# Patient Record
Sex: Female | Born: 1985 | Race: White | Hispanic: No | Marital: Married | Smoking: Never smoker
Health system: Southern US, Community
[De-identification: ages and names within clinical notes are randomized; demographics above are authoritative.]

## PROBLEM LIST (undated history)

## (undated) DIAGNOSIS — O24419 Gestational diabetes mellitus in pregnancy, unspecified control: Secondary | ICD-10-CM

## (undated) DIAGNOSIS — R7303 Prediabetes: Secondary | ICD-10-CM

## (undated) DIAGNOSIS — N979 Female infertility, unspecified: Secondary | ICD-10-CM

## (undated) DIAGNOSIS — Z8619 Personal history of other infectious and parasitic diseases: Secondary | ICD-10-CM

## (undated) DIAGNOSIS — F419 Anxiety disorder, unspecified: Secondary | ICD-10-CM

## (undated) DIAGNOSIS — R030 Elevated blood-pressure reading, without diagnosis of hypertension: Secondary | ICD-10-CM

## (undated) DIAGNOSIS — N83201 Unspecified ovarian cyst, right side: Secondary | ICD-10-CM

## (undated) DIAGNOSIS — J309 Allergic rhinitis, unspecified: Secondary | ICD-10-CM

## (undated) DIAGNOSIS — D649 Anemia, unspecified: Secondary | ICD-10-CM

## (undated) HISTORY — DX: Anemia, unspecified: D64.9

## (undated) HISTORY — PX: CHOLECYSTECTOMY, LAPAROSCOPIC: SHX56

## (undated) HISTORY — DX: Unspecified ovarian cyst, right side: N83.201

## (undated) HISTORY — DX: Elevated blood-pressure reading, without diagnosis of hypertension: R03.0

## (undated) HISTORY — PX: WISDOM TOOTH EXTRACTION: SHX21

## (undated) HISTORY — DX: Prediabetes: R73.03

## (undated) HISTORY — DX: Female infertility, unspecified: N97.9

## (undated) HISTORY — DX: Anxiety disorder, unspecified: F41.9

## (undated) HISTORY — DX: Allergic rhinitis, unspecified: J30.9

## (undated) HISTORY — PX: TONSILLECTOMY AND ADENOIDECTOMY: SUR1326

## (undated) HISTORY — DX: Personal history of other infectious and parasitic diseases: Z86.19

---

## 2012-11-09 DIAGNOSIS — N83201 Unspecified ovarian cyst, right side: Secondary | ICD-10-CM

## 2012-11-09 HISTORY — DX: Unspecified ovarian cyst, right side: N83.201

## 2014-06-28 ENCOUNTER — Other Ambulatory Visit (INDEPENDENT_AMBULATORY_CARE_PROVIDER_SITE_OTHER): Payer: BC Managed Care – PPO

## 2014-06-28 ENCOUNTER — Encounter: Payer: Self-pay | Admitting: Internal Medicine

## 2014-06-28 ENCOUNTER — Ambulatory Visit (INDEPENDENT_AMBULATORY_CARE_PROVIDER_SITE_OTHER): Payer: BC Managed Care – PPO | Admitting: Internal Medicine

## 2014-06-28 VITALS — BP 98/78 | HR 75 | Temp 98.3°F | Ht 62.0 in | Wt 136.2 lb

## 2014-06-28 DIAGNOSIS — Z0000 Encounter for general adult medical examination without abnormal findings: Secondary | ICD-10-CM

## 2014-06-28 DIAGNOSIS — J310 Chronic rhinitis: Secondary | ICD-10-CM | POA: Insufficient documentation

## 2014-06-28 DIAGNOSIS — R5381 Other malaise: Secondary | ICD-10-CM

## 2014-06-28 DIAGNOSIS — F988 Other specified behavioral and emotional disorders with onset usually occurring in childhood and adolescence: Secondary | ICD-10-CM

## 2014-06-28 DIAGNOSIS — R5383 Other fatigue: Secondary | ICD-10-CM

## 2014-06-28 LAB — BASIC METABOLIC PANEL
BUN: 11 mg/dL (ref 6–23)
CALCIUM: 9.7 mg/dL (ref 8.4–10.5)
CO2: 32 mEq/L (ref 19–32)
Chloride: 100 mEq/L (ref 96–112)
Creatinine, Ser: 0.6 mg/dL (ref 0.4–1.2)
GFR: 118.67 mL/min (ref >60.00)
Glucose, Bld: 90 mg/dL (ref 70–99)
Potassium: 3.8 mEq/L (ref 3.5–5.1)
SODIUM: 137 meq/L (ref 135–145)

## 2014-06-28 LAB — CBC WITH DIFFERENTIAL/PLATELET
BASOS ABS: 0 10*3/uL (ref 0.0–0.1)
BASOS PCT: 0.6 % (ref 0.0–3.0)
EOS PCT: 2 % (ref 0.0–5.0)
Eosinophils Absolute: 0.2 10*3/uL (ref 0.0–0.7)
HEMATOCRIT: 40.5 % (ref 36.0–46.0)
HEMOGLOBIN: 13.3 g/dL (ref 12.0–15.0)
LYMPHS PCT: 32.1 % (ref 12.0–46.0)
Lymphs Abs: 2.5 10*3/uL (ref 0.7–4.0)
MCHC: 33 g/dL (ref 30.0–36.0)
MCV: 85.8 fl (ref 78.0–100.0)
MONOS PCT: 8.1 % (ref 3.0–12.0)
Monocytes Absolute: 0.6 10*3/uL (ref 0.1–1.0)
Neutro Abs: 4.5 10*3/uL (ref 1.4–7.7)
Neutrophils Relative %: 57.2 % (ref 43.0–77.0)
Platelets: 191 10*3/uL (ref 150.0–400.0)
RBC: 4.71 Mil/uL (ref 3.87–5.11)
RDW: 13.8 % (ref 11.5–15.5)
WBC: 7.8 10*3/uL (ref 4.0–10.5)

## 2014-06-28 LAB — HEPATIC FUNCTION PANEL
ALK PHOS: 37 U/L — AB (ref 39–117)
ALT: 19 U/L (ref 0–35)
AST: 21 U/L (ref 0–37)
Albumin: 4.4 g/dL (ref 3.5–5.2)
Bilirubin, Direct: 0.1 mg/dL (ref 0.0–0.3)
TOTAL PROTEIN: 7 g/dL (ref 6.0–8.3)
Total Bilirubin: 0.7 mg/dL (ref 0.2–1.2)

## 2014-06-28 LAB — TSH: TSH: 1.56 u[IU]/mL (ref 0.35–4.50)

## 2014-06-28 LAB — CK: Total CK: 130 U/L (ref 7–177)

## 2014-06-28 LAB — SEDIMENTATION RATE: Sed Rate: 3 mm/hr (ref 0–22)

## 2014-06-28 LAB — VITAMIN D 25 HYDROXY (VIT D DEFICIENCY, FRACTURES): VITD: 36.88 ng/mL (ref 30.00–100.00)

## 2014-06-28 LAB — T4, FREE: FREE T4: 0.92 ng/dL (ref 0.60–1.60)

## 2014-06-28 NOTE — Patient Instructions (Signed)
Your next office appointment will be determined based upon review of your pending labs . Those instructions will be transmitted to you through My Chart  OR  by mail;whichever process is your choice to receive results & recommendations . 

## 2014-06-28 NOTE — Progress Notes (Signed)
Subjective:    Patient ID: Julia Jimenez, female    DOB: [birth year], 28 y.o.   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 She describes fatigue which has been present since she had mono in 2007. She questions whether the stresses of her job and getting married have aggravated her symptoms. The symptoms are described as constant. Adderall has been of some benefit. She has gained 10 pounds in the last 3 years despite a heart healthy diet and cardiovascular exercise at a high level. She will exercise 3-6 times per week as running for 20-30 minutes or as intensive workout class for 60 minutes. She has no limitations with her  exercise programs.  Her ADD was only diagnosed in 2009. She was having difficulty studying for & completing standardized tests on time. The results were strongly positive.  She had been evaluated as a child for possible dyslexia as her brother has dyslexia.     Review of Systems She specifically denies fever, chills, sweats, or weight loss. She has no visual disturbances. She also has no itchy, watery eyes, sneezing, or angioedema. No hoarseness or difficulty swallowing. Dyspnea, cough or sore reduction not present. Even with high level CVE there is no chest pain, palpitations, or claudication. She has no bowel changes of constipation or diarrhea. She has no rectal bleeding or melena. She denies joint pain or swelling. She has no myalgias. There are no significant balance issues except for minor dizziness intermittently. She has no change in her hair, skin, nails. She does have some easy bruising. Excessive snoring or apnea are denied. She has intermittent numbness along the ulnar aspect of the forearm which appears to be positional , occuring with the arm pronated. She describes sensitivity to excessive noise exposure. She also has sinus pressure with postnasal drainage  which is perennial. Remotely she had some anxiety for which she took Fluoxetine 10 mg daily in 2006.     Objective:   Physical Exam Pertinent or positive physical findings include: She is thin allowing palpation of the aorta. There is no enlargement. She does have a post @ the umbilicus. Otherwise the exam is unremarkable.  Gen.: Healthy and well-nourished in appearance. Alert, appropriate and cooperative throughout exam. Appears younger than stated age  Head: Normocephalic without obvious abnormalities; no alopecia  Eyes: No corneal or conjunctival inflammation noted. Pupils equal round reactive to light and accommodation. Extraocular motion intact.No lid lag or proptosis. Ears: External  ear exam reveals no significant lesions or deformities. Canals clear .TMs normal. Hearing is grossly normal bilaterally. Nose: External nasal exam reveals no deformity or inflammation. Nasal mucosa are pink and moist. No lesions or exudates noted.   Mouth: Oral mucosa and oropharynx reveal no lesions or exudates. Teeth in good repair. Neck: No deformities, masses, or tenderness noted. Range of motion & Thyroid normal. Lungs: Normal respiratory effort; chest expands symmetrically. Lungs are clear to auscultation without rales, wheezes, or increased work of breathing. Heart: Normal rate and rhythm. Normal S1 and S2. No gallop, click, or rub. No murmur. Abdomen: Bowel sounds normal; abdomen soft and nontender. No masses, organomegaly or hernias noted. Genitalia: as per Gyn                                  Musculoskeletal/extremities: No deformity or scoliosis noted of  the thoracic or  lumbar spine.  No clubbing, cyanosis, edema, or significant extremity  deformity noted. Range of motion normal .Tone & strength normal. Hand joints normal OR reveal mild  DJD DIP changes.  Fingernail health good. Able to lie down & sit up w/o help. Negative SLR bilaterally Vascular: Carotid, radial artery, dorsalis pedis and   posterior tibial pulses are full and equal. No bruits present. Neurologic: Alert and oriented x3. Deep tendon reflexes symmetrical and normal.  Gait normal  .      Skin: Intact without suspicious lesions or rashes. Lymph: No cervical, axillary lymphadenopathy present. Psych: Mood and affect are normal. Normally interactive                                                                                        Assessment & Plan:  #1 comprehensive physical exam; no acute findings #2 fatigue  Plan: see Orders  & Recommendations

## 2014-06-28 NOTE — Progress Notes (Signed)
Pre visit review using our clinic review tool, if applicable. No additional management support is needed unless otherwise documented below in the visit note. 

## 2014-06-29 ENCOUNTER — Encounter: Payer: Self-pay | Admitting: Internal Medicine

## 2014-06-29 LAB — LYME AB/WESTERN BLOT REFLEX: B burgdorferi Ab IgG+IgM: 0.42 {ISR}

## 2015-03-15 ENCOUNTER — Ambulatory Visit (INDEPENDENT_AMBULATORY_CARE_PROVIDER_SITE_OTHER): Payer: BLUE CROSS/BLUE SHIELD | Admitting: Obstetrics and Gynecology

## 2015-03-15 ENCOUNTER — Encounter: Payer: Self-pay | Admitting: Obstetrics and Gynecology

## 2015-03-15 VITALS — BP 110/66 | HR 76 | Resp 18 | Ht 61.75 in | Wt 135.8 lb

## 2015-03-15 DIAGNOSIS — Z01419 Encounter for gynecological examination (general) (routine) without abnormal findings: Secondary | ICD-10-CM

## 2015-03-15 DIAGNOSIS — Z0000 Encounter for general adult medical examination without abnormal findings: Secondary | ICD-10-CM | POA: Diagnosis not present

## 2015-03-15 DIAGNOSIS — N762 Acute vulvitis: Secondary | ICD-10-CM | POA: Diagnosis not present

## 2015-03-15 DIAGNOSIS — N926 Irregular menstruation, unspecified: Secondary | ICD-10-CM

## 2015-03-15 DIAGNOSIS — N766 Ulceration of vulva: Secondary | ICD-10-CM | POA: Diagnosis not present

## 2015-03-15 LAB — POCT URINALYSIS DIPSTICK
Bilirubin, UA: NEGATIVE
Blood, UA: NEGATIVE
GLUCOSE UA: NEGATIVE
Ketones, UA: NEGATIVE
LEUKOCYTES UA: NEGATIVE
NITRITE UA: NEGATIVE
Protein, UA: NEGATIVE
Urobilinogen, UA: NEGATIVE
pH, UA: 5

## 2015-03-15 LAB — POCT URINE PREGNANCY: Preg Test, Ur: NEGATIVE

## 2015-03-15 NOTE — Progress Notes (Signed)
Patient ID: Woodfin GanjaLauren Taylor Irvin, female   DOB: April 30, 1986, 30 y.o.   MRN: [id number] 29 y.o. G0P0 Married Caucasian female here for annual exam.   Has Mirena IUD.  Spotting in April 2016.   Has periodic menses.  Considering childbearing in one year.   Gained weight since off Yaz.  Increasing work outs more.  No acne or hair growth.  Now living in same place with husband.   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   Not really taking Adderall.   PCP:  Marga MelnickWilliam Hopper, MD  No LMP recorded. Patient is not currently having periods (Reason: IUD).          Sexually active: Yes.   husband The current method of family planning is IUD.--Mirena inserted 06-21-13.   Had a few ultrasounds following placement and ultrasound was fine.  Cannot feel strings.  Exercising: Yes.    running, weight lifting and classes at the gym. Smoker:  no  Health Maintenance: Pap:  2012/2013 normal History of abnormal Pap:  no MMG:  n/a Colonoscopy:  n/a BMD:   n/a  Result  n/a TDaP:  2010 Screening Labs:  Hb today: PCP, Urine today: Neg Urine UPT:  Neg   reports that she has never smoked. She does not have any smokeless tobacco history on file. She reports that she drinks about 1.2 oz of alcohol per week. She reports that she does not use illicit drugs.  Past Medical History  Diagnosis Date  . Allergic rhinitis   . Elevated blood pressure reading without diagnosis of hypertension     in context of Adderall initiation  . History of chicken pox   . Anemia     in high school  . Anxiety   . Ovarian cyst, right 2014    Past Surgical History  Procedure Laterality Date  . Tonsillectomy and adenoidectomy      Current Outpatient Prescriptions  Medication Sig Dispense Refill  . levonorgestrel (MIRENA) 20 MCG/24HR IUD 1 each by Intrauterine route once.    Marland Kitchen. amphetamine-dextroamphetamine (ADDERALL XR) 20 MG 24 hr capsule Take 20 mg by mouth as needed.     No current facility-administered  medications for this visit.    Family History  Problem Relation Age of Onset  . Hypertension Mother   . Diabetes Father   . Diabetes Maternal Aunt   . Stroke Maternal Uncle   . Diabetes Maternal Uncle     x2  . Diabetes Paternal Grandmother   . Heart attack Paternal Grandmother   . Cancer Maternal Grandfather     Dec CA of unknown origin  . Hepatitis C Maternal Grandmother     Dec  . Hypertension Brother     ROS:  Pertinent items are noted in HPI.  Otherwise, a comprehensive ROS was negative.  Exam:   BP 110/66 mmHg  Pulse 76  Resp 18  Ht 5' 1.75" (1.568 m)  Wt 135 lb 12.8 oz (61.598 kg)  BMI 25.05 kg/m2  LMP     General appearance: alert, cooperative and appears stated age Head: Normocephalic, without obvious abnormality, atraumatic Neck: no adenopathy, supple, symmetrical, trachea midline and thyroid normal to inspection and palpation Lungs: clear to auscultation bilaterally Breasts: normal appearance, no masses or tenderness, Inspection negative, No nipple retraction or dimpling, No nipple discharge or bleeding, No axillary or supraclavicular adenopathy Heart: regular rate and rhythm Abdomen: soft, non-tender; bowel sounds normal; no masses,  no organomegaly Extremities: extremities normal, atraumatic, no cyanosis  or edema Skin: Skin color, texture, turgor normal. No rashes or lesions Lymph nodes: Cervical, supraclavicular, and axillary nodes normal. No abnormal inguinal nodes palpated Neurologic: Grossly normal  Pelvic: External genitalia:  Ulcerative fissure of the let labia minora.              Urethra:  normal appearing urethra with no masses, tenderness or lesions              Bartholins and Skenes: normal                 Vagina: normal appearing vagina with normal color and discharge, no lesions              Cervix: no lesions.  IUD stings seen.               Pap taken: Yes.   Bimanual Exam:  Uterus:  normal size, contour, position, consistency, mobility,  non-tender              Adnexa: normal adnexa and no mass, fullness, tenderness              Rectovaginal: No..    Chaperone was present for exam.  Assessment:   Well woman visit with normal exam. Mirena IUD patient.  Weight gain.  Vulvar ulceration - Yeast vulvitis versus HSV. Desire for future childbearing.   Plan: Yearly mammogram recommended after age 29.  Recommended self breast exam.  Pap and HR HPV as above. Discussed Calcium, Vitamin D, regular exercise program including cardiovascular and weight bearing exercise. Labs performed.  Yes.  .   See orders.  TSH, Affirm, HSV culture. Refills given on medications.  No..    Take PNV with DHA. Follow up annually and prn.   After visit summary provided.

## 2015-03-15 NOTE — Patient Instructions (Addendum)

## 2015-03-16 LAB — WET PREP BY MOLECULAR PROBE
Candida species: POSITIVE — AB
GARDNERELLA VAGINALIS: POSITIVE — AB
Trichomonas vaginosis: NEGATIVE

## 2015-03-16 LAB — THYROID PANEL WITH TSH
Free Thyroxine Index: 2 (ref 1.4–3.8)
T3 Uptake: 28 % (ref 22–35)
T4 TOTAL: 7.1 ug/dL (ref 4.5–12.0)
TSH: 1.333 u[IU]/mL (ref 0.350–4.500)

## 2015-03-19 ENCOUNTER — Telehealth: Payer: Self-pay | Admitting: *Deleted

## 2015-03-19 LAB — IPS PAP TEST WITH HPV

## 2015-03-19 LAB — HERPES SIMPLEX VIRUS CULTURE: ORGANISM ID, BACTERIA: NOT DETECTED

## 2015-03-19 MED ORDER — METRONIDAZOLE 500 MG PO TABS: 500.0000 mg | ORAL_TABLET | Freq: Two times a day (BID) | ORAL | Status: DC

## 2015-03-19 MED ORDER — FLUCONAZOLE 150 MG PO TABS: 150.0000 mg | ORAL_TABLET | Freq: Once | ORAL | Status: DC

## 2015-03-19 NOTE — Telephone Encounter (Signed)
Call to patient to discuss lab results, left message to call back. Ask for triage nurse.

## 2015-03-19 NOTE — Telephone Encounter (Signed)
-----   Message from Patton SallesBrook E Amundson C Silva, MD sent at 03/18/2015  7:56 AM EDT ----- Please inform patient of her yeast and bacterial vaginitis.  Please send Rx for Diflucan and Flagyl to her pharmacy of choice.  Diflucan 150 mg po x 1.  Dispense - 2.  Refill zero.  Flagyl 500 mg po bid for 7 days. Dispense - 14. Refill zero.  Give her precautions about ETOH.   Thyroid function is normal.   HSV culture is not back yet.   Cc- Claudette LawsAmanda Dixon

## 2015-03-19 NOTE — Telephone Encounter (Signed)
Return call from patient, notified of lab results as directed by Dr Edward JollySilva. Also of recommendations for medications and instructions to avoid alcohol during Flagyl and for 3 days following. HSV result is now back and is negative but patient is notified that although this is negative, MD will need to reviewed since this is just been reviewed by RN.    Routing to provider for final review. Patient agreeable to disposition. Patient aware provider will review message and nurse will return call with any additional instructions or change of disposition. Will close encounter.

## 2015-09-20 ENCOUNTER — Ambulatory Visit (INDEPENDENT_AMBULATORY_CARE_PROVIDER_SITE_OTHER): Payer: BLUE CROSS/BLUE SHIELD | Admitting: Internal Medicine

## 2015-09-20 ENCOUNTER — Encounter: Payer: Self-pay | Admitting: Internal Medicine

## 2015-09-20 VITALS — BP 118/80 | HR 64 | Temp 97.6°F | Resp 16 | Ht 62.0 in | Wt 140.0 lb

## 2015-09-20 DIAGNOSIS — R0981 Nasal congestion: Secondary | ICD-10-CM

## 2015-09-20 DIAGNOSIS — F419 Anxiety disorder, unspecified: Secondary | ICD-10-CM

## 2015-09-20 DIAGNOSIS — F909 Attention-deficit hyperactivity disorder, unspecified type: Secondary | ICD-10-CM

## 2015-09-20 DIAGNOSIS — R635 Abnormal weight gain: Secondary | ICD-10-CM | POA: Diagnosis not present

## 2015-09-20 DIAGNOSIS — F988 Other specified behavioral and emotional disorders with onset usually occurring in childhood and adolescence: Secondary | ICD-10-CM

## 2015-09-20 MED ORDER — FLUOXETINE HCL 10 MG PO CAPS: 10.0000 mg | ORAL_CAPSULE | Freq: Every day | ORAL | Status: DC

## 2015-09-20 NOTE — Progress Notes (Signed)
Pre visit review using our clinic review tool, if applicable. No additional management support is needed unless otherwise documented below in the visit note. 

## 2015-09-20 NOTE — Progress Notes (Signed)
Subjective:    Patient ID: Julia Jimenez, female    DOB: 15-Jul-1986, 29 y.o.   MRN: [id number]  HPI She is here to establish with a new pcp. She is not new to the practice.    Nasal congestion, sinus infections:  She gets frequent sinus issues - alergies and infections.  She gets sick once every two months on average.  She takes zicam.  She is not taking any allergy medications regularly.   She is currently having nasal congestion, pnd, sneezing, sore throat from the pnd and an occasional cough.  She does not think she needs an antibiotic.  Anxiety, stress:  She has a lot of stress now, in the process of buying a dental practice.  She has battled with anxiety in the past and was on fluoxetine in the past.  She denies side effects from that medication and it worked well.  She wonders about starting medication again for her anxiety.  She denies depression.   Weight gain: She has gained weight.  She watches what she eats.  She works out regularly, but has done more weight lifting recently.  She got married about one year ago.  She does drink alcohol.      ADD:  She takes adderall rarely - only when studying.  She does not take it on a daily basis.      Medications and allergies reviewed with patient and updated if appropriate.  Patient Active Problem List   Diagnosis Date Noted  . Other malaise and fatigue 06/28/2014  . 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 ADD (attention deficit disorder) 06/28/2014    Current Outpatient Prescriptions on File Prior to Visit  Medication Sig Dispense Refill  . amphetamine-dextroamphetamine (ADDERALL XR) 20 MG 24 hr capsule Take 20 mg by mouth as needed.    Marland Kitchen levonorgestrel (MIRENA) 20 MCG/24HR IUD 1 each by Intrauterine route once.     No current facility-administered medications on file prior to visit.    Past Medical History  Diagnosis Date  . Allergic rhinitis   . Elevated blood pressure reading without diagnosis of hypertension     in  context of Adderall initiation  . History of chicken pox   . Anemia     in high school  . Anxiety   . Ovarian cyst, right 2014    Past Surgical History  Procedure Laterality Date  . Tonsillectomy and adenoidectomy      Social History   Social History  . Marital Status: Married    Spouse Name: N/A  . Number of Children: N/A  . Years of Education: N/A   Social History Main Topics  . Smoking status: Never Smoker   . Smokeless tobacco: None  . Alcohol Use: 1.2 oz/week    2 Standard drinks or equivalent per week     Comment: occasionally  . Drug Use: No  . Sexual Activity:    Partners: Male    Birth Control/ Protection: IUD     Comment: Mirena IUD inserted 06-21-13   Other Topics Concern  . None   Social History Narrative   Married    2 people living in household   Employed    Highest education level DDS   Occupation Dentist   7-8 hours of sleep a night     Review of Systems  Constitutional: Negative for fever and chills.  HENT: Positive for congestion, postnasal drip, sneezing and sore throat. Negative for ear pain and sinus pressure.   Respiratory:  Positive for cough. Negative for shortness of breath and wheezing.   Cardiovascular: Negative for chest pain, palpitations and leg swelling.  Neurological: Positive for headaches (tension). Negative for dizziness and light-headedness.  Psychiatric/Behavioral: Negative for dysphoric mood. The patient is nervous/anxious.        Objective:   Filed Vitals:   09/20/15 1339  BP: 118/80  Pulse: 64  Temp: 97.6 F (36.4 C)  Resp: 16   Filed Weights   09/20/15 1339  Weight: 140 lb (63.504 kg)   Body mass index is 25.6 kg/(m^2).   Physical Exam  Constitutional: She appears well-developed and well-nourished. No distress.  HENT:  Head: Normocephalic and atraumatic.  Right Ear: External ear normal.  Left Ear: External ear normal.  Mouth/Throat: Oropharynx is clear and moist.  B/l ear canals and TM normal  Eyes:  Conjunctivae are normal.  Neck: Neck supple. No tracheal deviation present. No thyromegaly present.  Cardiovascular: Normal rate, regular rhythm and normal heart sounds.   No murmur heard. Pulmonary/Chest: Effort normal and breath sounds normal. No respiratory distress. She has no wheezes. She has no rales.  Musculoskeletal: She exhibits no edema.  Lymphadenopathy:    She has no cervical adenopathy.  Psychiatric: She has a normal mood and affect. Her behavior is normal. Judgment and thought content normal.       Assessment & Plan:    Weight gain: Likely related to increased caloric intake recommended calorie couting and cutting back   See Problem List for further A/P.

## 2015-09-20 NOTE — Patient Instructions (Signed)
Use over-the-counter medications for your allergy/sinus symptoms  All other Health Maintenance issues reviewed.   All recommended immunizations and age-appropriate screenings are up-to-date.  No immunizations administered today.   Medications reviewed and updated.  We will start fluoxetine 10 mg daily.  Your prescription(s) have been submitted to your pharmacy. Please take as directed and contact our office if you believe you are having problem(s) with the medication(s).

## 2015-09-22 DIAGNOSIS — F419 Anxiety disorder, unspecified: Secondary | ICD-10-CM | POA: Insufficient documentation

## 2015-09-22 DIAGNOSIS — R0981 Nasal congestion: Secondary | ICD-10-CM | POA: Insufficient documentation

## 2015-09-22 NOTE — Assessment & Plan Note (Signed)
Increased anxiety recently Will restart fluoxetine at 10 mg daily - she did well with that dose in the past -- may need a higher dose She plans on getting pregnant in the next year-year and a half, but currently has an IUD.  Discussed getting off medication prior to getting pregnant Call after a few weeks if she feels she needs a higher dose

## 2015-09-22 NOTE — Assessment & Plan Note (Signed)
Likely allergy related to viral infection Discussed several otc meds she can try - she may need to take an anti-histamine or flonase on a more regular basis

## 2015-09-22 NOTE — Assessment & Plan Note (Signed)
Uses adderall infrequently when studying, not daily

## 2016-02-11 DIAGNOSIS — D2262 Melanocytic nevi of left upper limb, including shoulder: Secondary | ICD-10-CM | POA: Diagnosis not present

## 2016-02-11 DIAGNOSIS — D2261 Melanocytic nevi of right upper limb, including shoulder: Secondary | ICD-10-CM | POA: Diagnosis not present

## 2016-02-11 DIAGNOSIS — D225 Melanocytic nevi of trunk: Secondary | ICD-10-CM | POA: Diagnosis not present

## 2016-02-11 DIAGNOSIS — D2361 Other benign neoplasm of skin of right upper limb, including shoulder: Secondary | ICD-10-CM | POA: Diagnosis not present

## 2016-02-11 DIAGNOSIS — D485 Neoplasm of uncertain behavior of skin: Secondary | ICD-10-CM | POA: Diagnosis not present

## 2016-03-20 ENCOUNTER — Encounter: Payer: Self-pay | Admitting: Obstetrics and Gynecology

## 2016-03-20 ENCOUNTER — Ambulatory Visit (INDEPENDENT_AMBULATORY_CARE_PROVIDER_SITE_OTHER): Payer: BLUE CROSS/BLUE SHIELD | Admitting: Obstetrics and Gynecology

## 2016-03-20 VITALS — BP 100/70 | HR 76 | Resp 18 | Ht 62.0 in | Wt 141.0 lb

## 2016-03-20 DIAGNOSIS — N926 Irregular menstruation, unspecified: Secondary | ICD-10-CM | POA: Diagnosis not present

## 2016-03-20 DIAGNOSIS — Z01419 Encounter for gynecological examination (general) (routine) without abnormal findings: Secondary | ICD-10-CM

## 2016-03-20 DIAGNOSIS — Z0000 Encounter for general adult medical examination without abnormal findings: Secondary | ICD-10-CM | POA: Diagnosis not present

## 2016-03-20 LAB — POCT URINALYSIS DIPSTICK
BILIRUBIN UA: NEGATIVE
Glucose, UA: NEGATIVE
KETONES UA: NEGATIVE
Leukocytes, UA: NEGATIVE
NITRITE UA: NEGATIVE
PH UA: 5
PROTEIN UA: NEGATIVE
RBC UA: NEGATIVE
Urobilinogen, UA: NEGATIVE

## 2016-03-20 LAB — POCT URINE PREGNANCY: Preg Test, Ur: NEGATIVE

## 2016-03-20 NOTE — Progress Notes (Signed)
Patient ID: Julia Jimenez, female   DOB: 09/13/1986, 30 y.o.   MRN: [id number] 30 y.o. 270P0000 Married Caucasian female here for annual exam.    Thinking about childbearing at the end of the year.  Wants Cytotec for the removal of the IUD.  States she had a really difficult time with removal.   Not taking Prozac daily.  Is on this as a short term plan due to stress of profession.   Had a Rubella titer check in 2010 and thinks she had a booster.  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   Patient requesting UPT today. QIO:[id number]:Negative  PCP:  Cheryll CockayneStacy Burns, MD   No LMP recorded. Patient is not currently having periods (Reason: IUD).     Period Cycle (Days):  (no real cycles with Mirena IUD)     Sexually active: Yes.   maleThe current method of family planning is IUD--Mirena inserted 06-21-13.    Exercising: Yes.    running and circuit training, etc. Smoker:  no  Health Maintenance: Pap:  03-15-15 Neg:Neg HR HPV History of abnormal Pap:  no MMG:  n/a Colonoscopy:  n/a BMD:   n/a  Result  n/a TDaP:  11-10-08 Gardasil:   yes   Screening Labs:  Hb today: PCP, Urine today: Neg   reports that she has never smoked. She does not have any smokeless tobacco history on file. She reports that she drinks about 1.2 oz of alcohol per week. She reports that she does not use illicit drugs.  Past Medical History  Diagnosis Date  . Allergic rhinitis   . Elevated blood pressure reading without diagnosis of hypertension     in context of Adderall initiation  . History of chicken pox   . Anemia     in high school  . Anxiety   . Ovarian cyst, right 2014    Past Surgical History  Procedure Laterality Date  . Tonsillectomy and adenoidectomy      Current Outpatient Prescriptions  Medication Sig Dispense Refill  . amphetamine-dextroamphetamine (ADDERALL XR) 20 MG 24 hr capsule Take 20 mg by mouth as needed.    . Cholecalciferol (VITAMIN D-3) 1000 units CAPS Take 1 capsule by  mouth daily.    Marland Kitchen. FLUoxetine (PROZAC) 10 MG capsule Take 1 capsule (10 mg total) by mouth daily. 90 capsule 1  . levonorgestrel (MIRENA) 20 MCG/24HR IUD 1 each by Intrauterine route once.    . Prenatal Vit-Fe Fumarate-FA (PRENATAL VITAMIN PO) Take 1 tablet by mouth daily.     No current facility-administered medications for this visit.    Family History  Problem Relation Age of Onset  . Hypertension Mother   . Diabetes Father   . Diabetes Maternal Aunt   . Stroke Maternal Uncle   . Diabetes Maternal Uncle     x2  . Diabetes Paternal Grandmother   . Heart attack Paternal Grandmother   . Cancer Maternal Grandfather     Dec CA of unknown origin  . Hepatitis C Maternal Grandmother     Dec  . Hypertension Brother     ROS:  Pertinent items are noted in HPI.  Otherwise, a comprehensive ROS was negative.  Exam:   BP 100/70 mmHg  Pulse 76  Resp 18  Ht 5\' 2"  (1.575 m)  Wt 141 lb (63.957 kg)  BMI 25.78 kg/m2  LMP     General appearance: alert, cooperative and appears stated age Head: Normocephalic, without obvious abnormality, atraumatic Neck: no adenopathy,  supple, symmetrical, trachea midline and thyroid normal to inspection and palpation Lungs: clear to auscultation bilaterally Breasts: normal appearance, no masses or tenderness, Inspection negative, No nipple retraction or dimpling, No nipple discharge or bleeding, No axillary or supraclavicular adenopathy Heart: regular rate and rhythm Abdomen: incisions:  No.    , soft, non-tender; no masses, no organomegaly Extremities: extremities normal, atraumatic, no cyanosis or edema Skin: Skin color, texture, turgor normal. No rashes or lesions Lymph nodes: Cervical, supraclavicular, and axillary nodes normal. No abnormal inguinal nodes palpated Neurologic: Grossly normal  Pelvic: External genitalia:  no lesions              Urethra:  normal appearing urethra with no masses, tenderness or lesions              Bartholins and Skenes:  normal                 Vagina: normal appearing vagina with normal color and discharge, no lesions              Cervix: no lesions and IUD strings present.              Pap taken: No. Bimanual Exam:  Uterus:  normal size, contour, position, consistency, mobility, non-tender.  Bimanual exam limited by patient's full bladder and difficulty relaxing abdominal wall.              Adnexa: normal adnexa and no mass, fullness, tenderness              Rectal exam: No..     Chaperone was present for exam.  Assessment:   Well woman visit with normal exam. Mirena IUD patient.  Desire for future pregnancy.  Plan: Yearly mammogram recommended after age 70.  Recommended self breast exam.  Pap and HR HPV as above. Discussed Calcium, Vitamin D, regular exercise program including cardiovascular and weight bearing exercise. Labs performed.  No..     Prescription medication(s) given.  No..    Discussed prenatal care.  Avoidance of exposures and unnecessary medications.  Dicussed reading for pregnancy preparation.  Plan for removal of IUD at the end of the year or when patient ready.  I stated that Cytotec may be used but may not be necessary.  Take Motrin 800 mg 1 hour prior to removal of IUD. Follow up annually and prn.       After visit summary provided.

## 2016-03-20 NOTE — Patient Instructions (Signed)
Health Maintenance, Female Adopting a healthy lifestyle and getting preventive care can go a long way to promote health and wellness. Talk with your health care provider about what schedule of regular examinations is right for you. This is a good chance for you to check in with your provider about disease prevention and staying healthy. In between checkups, there are plenty of things you can do on your own. Experts have done a lot of research about which lifestyle changes and preventive measures are most likely to keep you healthy. Ask your health care provider for more information. WEIGHT AND DIET  Eat a healthy diet  Be sure to include plenty of vegetables, fruits, low-fat dairy products, and lean protein.  Do not eat a lot of foods high in solid fats, added sugars, or salt.  Get regular exercise. This is one of the most important things you can do for your health.  Most adults should exercise for at least 150 minutes each week. The exercise should increase your heart rate and make you sweat (moderate-intensity exercise).  Most adults should also do strengthening exercises at least twice a week. This is in addition to the moderate-intensity exercise.  Maintain a healthy weight  Body mass index (BMI) is a measurement that can be used to identify possible weight problems. It estimates body fat based on height and weight. Your health care provider can help determine your BMI and help you achieve or maintain a healthy weight.  For females 20 years of age and older:   A BMI below 18.5 is considered underweight.  A BMI of 18.5 to 24.9 is normal.  A BMI of 25 to 29.9 is considered overweight.  A BMI of 30 and above is considered obese.  Watch levels of cholesterol and blood lipids  You should start having your blood tested for lipids and cholesterol at 31 years of age, then have this test every 5 years.  You may need to have your cholesterol levels checked more often if:  Your lipid  or cholesterol levels are high.  You are older than 31 years of age.  You are at high risk for heart disease.  CANCER SCREENING   Lung Cancer  Lung cancer screening is recommended for adults 55-80 years old who are at high risk for lung cancer because of a history of smoking.  A yearly low-dose CT scan of the lungs is recommended for people who:  Currently smoke.  Have quit within the past 15 years.  Have at least a 30-pack-year history of smoking. A pack year is smoking an average of one pack of cigarettes a day for 1 year.  Yearly screening should continue until it has been 15 years since you quit.  Yearly screening should stop if you develop a health problem that would prevent you from having lung cancer treatment.  Breast Cancer  Practice breast self-awareness. This means understanding how your breasts normally appear and feel.  It also means doing regular breast self-exams. Let your health care provider know about any changes, no matter how small.  If you are in your 20s or 30s, you should have a clinical breast exam (CBE) by a health care provider every 1-3 years as part of a regular health exam.  If you are 40 or older, have a CBE every year. Also consider having a breast X-ray (mammogram) every year.  If you have a family history of breast cancer, talk to your health care provider about genetic screening.  If you   are at high risk for breast cancer, talk to your health care provider about having an MRI and a mammogram every year.  Breast cancer gene (BRCA) assessment is recommended for women who have family members with BRCA-related cancers. BRCA-related cancers include:  Breast.  Ovarian.  Tubal.  Peritoneal cancers.  Results of the assessment will determine the need for genetic counseling and BRCA1 and BRCA2 testing. Cervical Cancer Your health care provider may recommend that you be screened regularly for cancer of the pelvic organs (ovaries, uterus, and  vagina). This screening involves a pelvic examination, including checking for microscopic changes to the surface of your cervix (Pap test). You may be encouraged to have this screening done every 3 years, beginning at age 21.  For women ages 30-65, health care providers may recommend pelvic exams and Pap testing every 3 years, or they may recommend the Pap and pelvic exam, combined with testing for human papilloma virus (HPV), every 5 years. Some types of HPV increase your risk of cervical cancer. Testing for HPV may also be done on women of any age with unclear Pap test results.  Other health care providers may not recommend any screening for nonpregnant women who are considered low risk for pelvic cancer and who do not have symptoms. Ask your health care provider if a screening pelvic exam is right for you.  If you have had past treatment for cervical cancer or a condition that could lead to cancer, you need Pap tests and screening for cancer for at least 20 years after your treatment. If Pap tests have been discontinued, your risk factors (such as having a new sexual partner) need to be reassessed to determine if screening should resume. Some women have medical problems that increase the chance of getting cervical cancer. In these cases, your health care provider may recommend more frequent screening and Pap tests. Colorectal Cancer  This type of cancer can be detected and often prevented.  Routine colorectal cancer screening usually begins at 31 years of age and continues through 31 years of age.  Your health care provider may recommend screening at an earlier age if you have risk factors for colon cancer.  Your health care provider may also recommend using home test kits to check for hidden blood in the stool.  A small camera at the end of a tube can be used to examine your colon directly (sigmoidoscopy or colonoscopy). This is done to check for the earliest forms of colorectal  cancer.  Routine screening usually begins at age 50.  Direct examination of the colon should be repeated every 5-10 years through 31 years of age. However, you may need to be screened more often if early forms of precancerous polyps or small growths are found. Skin Cancer  Check your skin from head to toe regularly.  Tell your health care provider about any new moles or changes in moles, especially if there is a change in a mole's shape or color.  Also tell your health care provider if you have a mole that is larger than the size of a pencil eraser.  Always use sunscreen. Apply sunscreen liberally and repeatedly throughout the day.  Protect yourself by wearing long sleeves, pants, a wide-brimmed hat, and sunglasses whenever you are outside. HEART DISEASE, DIABETES, AND HIGH BLOOD PRESSURE   High blood pressure causes heart disease and increases the risk of stroke. High blood pressure is more likely to develop in:  People who have blood pressure in the high end   of the normal range (130-139/85-89 mm Hg).  People who are overweight or obese.  People who are African American.  If you are 38-23 years of age, have your blood pressure checked every 3-5 years. If you are 61 years of age or older, have your blood pressure checked every year. You should have your blood pressure measured twice--once when you are at a hospital or clinic, and once when you are not at a hospital or clinic. Record the average of the two measurements. To check your blood pressure when you are not at a hospital or clinic, you can use:  An automated blood pressure machine at a pharmacy.  A home blood pressure monitor.  If you are between 45 years and 39 years old, ask your health care provider if you should take aspirin to prevent strokes.  Have regular diabetes screenings. This involves taking a blood sample to check your fasting blood sugar level.  If you are at a normal weight and have a low risk for diabetes,  have this test once every three years after 31 years of age.  If you are overweight and have a high risk for diabetes, consider being tested at a younger age or more often. PREVENTING INFECTION  Hepatitis B  If you have a higher risk for hepatitis B, you should be screened for this virus. You are considered at high risk for hepatitis B if:  You were born in a country where hepatitis B is common. Ask your health care provider which countries are considered high risk.  Your parents were born in a high-risk country, and you have not been immunized against hepatitis B (hepatitis B vaccine).  You have HIV or AIDS.  You use needles to inject street drugs.  You live with someone who has hepatitis B.  You have had sex with someone who has hepatitis B.  You get hemodialysis treatment.  You take certain medicines for conditions, including cancer, organ transplantation, and autoimmune conditions. Hepatitis C  Blood testing is recommended for:  Everyone born from [birth year] through 1966.  Anyone with known risk factors for hepatitis C. Sexually transmitted infections (STIs)  You should be screened for sexually transmitted infections (STIs) including gonorrhea and chlamydia if:  You are sexually active and are younger than 31 years of age.  You are older than 31 years of age and your health care provider tells you that you are at risk for this type of infection.  Your sexual activity has changed since you were last screened and you are at an increased risk for chlamydia or gonorrhea. Ask your health care provider if you are at risk.  If you do not have HIV, but are at risk, it may be recommended that you take a prescription medicine daily to prevent HIV infection. This is called pre-exposure prophylaxis (PrEP). You are considered at risk if:  You are sexually active and do not regularly use condoms or know the HIV status of your partner(s).  You take drugs by injection.  You are sexually  active with a partner who has HIV. Talk with your health care provider about whether you are at high risk of being infected with HIV. If you choose to begin PrEP, you should first be tested for HIV. You should then be tested every 3 months for as long as you are taking PrEP.  PREGNANCY   If you are premenopausal and you may become pregnant, ask your health care provider about preconception counseling.  If you may  become pregnant, take 400 to 800 micrograms (mcg) of folic acid every day.  If you want to prevent pregnancy, talk to your health care provider about birth control (contraception). OSTEOPOROSIS AND MENOPAUSE   Osteoporosis is a disease in which the bones lose minerals and strength with aging. This can result in serious bone fractures. Your risk for osteoporosis can be identified using a bone density scan.  If you are 61 years of age or older, or if you are at risk for osteoporosis and fractures, ask your health care provider if you should be screened.  Ask your health care provider whether you should take a calcium or vitamin D supplement to lower your risk for osteoporosis.  Menopause may have certain physical symptoms and risks.  Hormone replacement therapy may reduce some of these symptoms and risks. Talk to your health care provider about whether hormone replacement therapy is right for you.  HOME CARE INSTRUCTIONS   Schedule regular health, dental, and eye exams.  Stay current with your immunizations.   Do not use any tobacco products including cigarettes, chewing tobacco, or electronic cigarettes.  If you are pregnant, do not drink alcohol.  If you are breastfeeding, limit how much and how often you drink alcohol.  Limit alcohol intake to no more than 1 drink per day for nonpregnant women. One drink equals 12 ounces of beer, 5 ounces of wine, or 1 ounces of hard liquor.  Do not use street drugs.  Do not share needles.  Ask your health care provider for help if  you need support or information about quitting drugs.  Tell your health care provider if you often feel depressed.  Tell your health care provider if you have ever been abused or do not feel safe at home.   This information is not intended to replace advice given to you by your health care provider. Make sure you discuss any questions you have with your health care provider.   Document Released: 05/11/2011 Document Revised: 11/16/2014 Document Reviewed: 09/27/2013 Elsevier Interactive Patient Education [employer].

## 2016-05-03 ENCOUNTER — Other Ambulatory Visit: Payer: Self-pay | Admitting: Internal Medicine

## 2016-06-12 DIAGNOSIS — H11152 Pinguecula, left eye: Secondary | ICD-10-CM | POA: Diagnosis not present

## 2016-06-12 DIAGNOSIS — H40013 Open angle with borderline findings, low risk, bilateral: Secondary | ICD-10-CM | POA: Diagnosis not present

## 2016-08-25 ENCOUNTER — Ambulatory Visit (INDEPENDENT_AMBULATORY_CARE_PROVIDER_SITE_OTHER): Payer: BLUE CROSS/BLUE SHIELD | Admitting: Family

## 2016-08-25 ENCOUNTER — Encounter: Payer: Self-pay | Admitting: Family

## 2016-08-25 ENCOUNTER — Other Ambulatory Visit (INDEPENDENT_AMBULATORY_CARE_PROVIDER_SITE_OTHER): Payer: BLUE CROSS/BLUE SHIELD

## 2016-08-25 VITALS — BP 120/80 | HR 73 | Temp 98.1°F | Resp 16 | Ht 62.0 in | Wt 143.0 lb

## 2016-08-25 DIAGNOSIS — R42 Dizziness and giddiness: Secondary | ICD-10-CM | POA: Diagnosis not present

## 2016-08-25 DIAGNOSIS — R5383 Other fatigue: Secondary | ICD-10-CM

## 2016-08-25 LAB — FERRITIN: FERRITIN: 61.9 ng/mL (ref 10.0–291.0)

## 2016-08-25 LAB — IBC PANEL
IRON: 131 ug/dL (ref 42–145)
SATURATION RATIOS: 34.4 % (ref 20.0–50.0)
TRANSFERRIN: 272 mg/dL (ref 212.0–360.0)

## 2016-08-25 LAB — CBC
HEMATOCRIT: 40.1 % (ref 36.0–46.0)
Hemoglobin: 13.5 g/dL (ref 12.0–15.0)
MCHC: 33.7 g/dL (ref 30.0–36.0)
MCV: 84.2 fl (ref 78.0–100.0)
Platelets: 216 10*3/uL (ref 150.0–400.0)
RBC: 4.77 Mil/uL (ref 3.87–5.11)
RDW: 13.1 % (ref 11.5–15.5)
WBC: 6.2 10*3/uL (ref 4.0–10.5)

## 2016-08-25 LAB — POCT URINE PREGNANCY: Preg Test, Ur: NEGATIVE

## 2016-08-25 LAB — B12 AND FOLATE PANEL
Folate: 8.4 ng/mL (ref >5.9)
Vitamin B-12: 314 pg/mL (ref 211–911)

## 2016-08-25 LAB — TSH: TSH: 1.77 u[IU]/mL (ref 0.35–4.50)

## 2016-08-25 LAB — MONONUCLEOSIS SCREEN: MONO SCREEN: POSITIVE — AB

## 2016-08-25 NOTE — Progress Notes (Signed)
Subjective:    Patient ID: Julia Jimenez, female    DOB: 21-Feb-1986, 30 y.o.   MRN: [id number]  Chief Complaint  Patient presents with  . Fatigue    always very fatigue and having dizzy spells, headache, had mono in college     HPI:  Julia Jimenez is a 30 y.o. female who  has a past medical history of Allergic rhinitis; Anemia; Anxiety; Elevated blood pressure reading without diagnosis of hypertension; History of chicken pox; and Ovarian cyst, right (2014). and presents today for an office visit.   This is a new and chronic problem.  Associated symptoms of fatigue that has been going on for about 10 years and dizziness and headaches which is new onset. Describes the dizziness as more of a lightheadedness. Generally lasts for a couple of seconds and then goes away. Symptoms are random with no triggers that she notices and can occur when standing or sitting. Menstrual cycles are light when she does have them as she has an IUD. Indicates that she is sleeping at night. Does not feel well rested when she wakes up. 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 Caffeine intake is about 1-2 cups on average.   No Known Allergies    Outpatient Medications Prior to Visit  Medication Sig Dispense Refill  . amphetamine-dextroamphetamine (ADDERALL XR) 20 MG 24 hr capsule Take 20 mg by mouth as needed. Reported on 03/20/2016    . Cholecalciferol (VITAMIN D-3) 1000 units CAPS Take 1 capsule by mouth daily.    Marland Kitchen FLUoxetine (PROZAC) 10 MG capsule TAKE ONE CAPSULE BY MOUTH EVERY DAY 90 capsule 1  . levonorgestrel (MIRENA) 20 MCG/24HR IUD 1 each by Intrauterine route once.    . Prenatal Vit-Fe Fumarate-FA (PRENATAL VITAMIN PO) Take 1 tablet by mouth daily.     No facility-administered medications prior to visit.       Past Surgical History:  Procedure Laterality Date  . TONSILLECTOMY AND ADENOIDECTOMY        Past Medical  History:  Diagnosis Date  . Allergic rhinitis   . Anemia    in high school  . Anxiety   . Elevated blood pressure reading without diagnosis of hypertension    in context of Adderall initiation  . History of chicken pox   . Ovarian cyst, right 2014    Review of Systems  Constitutional: Positive for fatigue. Negative for chills and fever.  HENT: Negative for sore throat.   Respiratory: Negative for chest tightness and shortness of breath.   Cardiovascular: Negative for chest pain, palpitations and leg swelling.  Gastrointestinal: Negative for abdominal pain.  Neurological: Positive for light-headedness and headaches. Negative for dizziness, seizures, weakness and numbness.  Psychiatric/Behavioral: Negative for sleep disturbance.      Objective:    BP 120/80 (BP Location: Left Arm, Patient Position: Sitting, Cuff Size: Normal)   Pulse 73   Temp 98.1 F (36.7 C) (Oral)   Resp 16   Ht 5\' 2"  (1.575 m)   Wt 143 lb (64.9 kg)   SpO2 99%   BMI 26.16 kg/m  Nursing note and vital signs reviewed.  Physical Exam  Constitutional: She is oriented to person, place, and time. She appears well-developed and well-nourished. No distress.  HENT:  Right Ear: Hearing, tympanic membrane, external ear and ear canal normal.  Left Ear: Hearing, tympanic membrane, external ear and ear canal normal.  Nose: Nose normal.  Mouth/Throat: Uvula is midline, oropharynx is clear and moist and mucous membranes are normal.  Cardiovascular: Normal rate, regular rhythm, normal heart sounds and intact distal pulses.   Pulmonary/Chest: Effort normal and breath sounds normal. She has no wheezes. She has no rales.  Neurological: She is alert and oriented to person, place, and time.  Skin: Skin is warm and dry.  Psychiatric: She has a normal mood and affect. Her behavior is normal. Judgment and thought content normal.       Assessment & Plan:   Problem List Items Addressed This Visit      Other   Fatigue     Fatigue of undetermined origin with history of mononucleosis. Obtain TSH, mononucleosis screen, IBC panel, ferritin, CBC, and B12/folate to rule out metabolic causes. Cannot rule out underlying depression or unlikely cardiovascular disease. This may also be related to stress or possible chronic fatigue given her multiple stressors including work and family life. Follow-up pending blood work results.      Relevant Orders   CBC (Completed)   TSH (Completed)   B12 and Folate Panel (Completed)   Ferritin   IBC panel (Completed)   Mononucleosis screen (Completed)   POCT urine pregnancy (Completed)   Lightheadedness    Lightheadedness of undetermined origin and appears to drink plenty of fluids with minimal caffeine intake. Question possible relation to anxiety or underlying fatigue. Encouraged to drink plenty of fluids and minimize caffeine intake. Follow-up if symptoms worsen or do not improve with previous pending blood work.      Relevant Orders   POCT urine pregnancy (Completed)    Other Visit Diagnoses   None.      I am having Ms. Irvin maintain her levonorgestrel, amphetamine-dextroamphetamine, Vitamin D-3, Prenatal Vit-Fe Fumarate-FA (PRENATAL VITAMIN PO), and FLUoxetine.   Follow-up: Return if symptoms worsen or fail to improve.  Jeanine Luzalone, Errica Dutil, FNP

## 2016-08-25 NOTE — Assessment & Plan Note (Signed)
Fatigue of undetermined origin with history of mononucleosis. Obtain TSH, mononucleosis screen, IBC panel, ferritin, CBC, and B12/folate to rule out metabolic causes. Cannot rule out underlying depression or unlikely cardiovascular disease. This may also be related to stress or possible chronic fatigue given her multiple stressors including work and family life. Follow-up pending blood work results.

## 2016-08-25 NOTE — Patient Instructions (Addendum)
Thank you for choosing [facility].  SUMMARY AND INSTRUCTIONS:  Drink plenty of fluids. Minimize caffeine.  Change positions slowly.  Labs:  Please stop by the lab on the lower level of the building for your blood work. Your results will be released to MyChart (or called to you) after review, usually within 72 hours after test completion. If any changes need to be made, you will be notified at that same time.  1.) The lab is open from 7:30am to 5:30 pm Monday-Friday 2.) No appointment is necessary 3.) Fasting (if needed) is 6-8 hours after food and drink; black coffee and water are okay   Follow up:  If your symptoms worsen or fail to improve, please contact our office for further instruction, or in case of emergency go directly to the emergency room at the closest medical facility.   Fatigue Fatigue is feeling tired all of the time, a lack of energy, or a lack of motivation. Occasional or mild fatigue is often a normal response to activity or life in general. However, long-lasting (chronic) or extreme fatigue may indicate an underlying medical condition. HOME CARE INSTRUCTIONS  Watch your fatigue for any changes. The following actions may help to lessen any discomfort you are feeling:  Talk to your health care provider about how much sleep you need each night. Try to get the required amount every night.  Take medicines only as directed by your health care provider.  Eat a healthy and nutritious diet. Ask your health care provider if you need help changing your diet.  Drink enough fluid to keep your urine clear or pale yellow.  Practice ways of relaxing, such as yoga, meditation, massage therapy, or acupuncture.  Exercise regularly.   Change situations that cause you stress. Try to keep your work and personal routine reasonable.  Do not abuse illegal drugs.  Limit alcohol intake to no more than 1 drink per day for nonpregnant women and 2 drinks per day for men. One  drink equals 12 ounces of beer, 5 ounces of wine, or 1 ounces of hard liquor.  Take a multivitamin, if directed by your health care provider. SEEK MEDICAL CARE IF:   Your fatigue does not get better.  You have a fever.   You have unintentional weight loss or gain.  You have headaches.   You have difficulty:   Falling asleep.  Sleeping throughout the night.  You feel angry, guilty, anxious, or sad.   You are unable to have a bowel movement (constipation).   You skin is dry.   Your legs or another part of your body is swollen.  SEEK IMMEDIATE MEDICAL CARE IF:   You feel confused.   Your vision is blurry.  You feel faint or pass out.   You have a severe headache.   You have severe abdominal, pelvic, or back pain.   You have chest pain, shortness of breath, or an irregular or fast heartbeat.   You are unable to urinate or you urinate less than normal.   You develop abnormal bleeding, such as bleeding from the rectum, vagina, nose, lungs, or nipples.  You vomit blood.   You have thoughts about harming yourself or committing suicide.   You are worried that you might harm someone else.    This information is not intended to replace advice given to you by your health care provider. Make sure you discuss any questions you have with your health care provider.   Document Released: 08/23/2007 Document  Revised: 11/16/2014 Document Reviewed: 02/27/2014 Elsevier Interactive Patient Education [employer] Inc[employer]

## 2016-08-25 NOTE — Assessment & Plan Note (Signed)
Lightheadedness of undetermined origin and appears to drink plenty of fluids with minimal caffeine intake. Question possible relation to anxiety or underlying fatigue. Encouraged to drink plenty of fluids and minimize caffeine intake. Follow-up if symptoms worsen or do not improve with previous pending blood work.

## 2016-09-08 ENCOUNTER — Ambulatory Visit (INDEPENDENT_AMBULATORY_CARE_PROVIDER_SITE_OTHER): Payer: BLUE CROSS/BLUE SHIELD | Admitting: Internal Medicine

## 2016-09-08 VITALS — BP 102/74 | HR 76 | Temp 98.5°F | Resp 16 | Wt 144.0 lb

## 2016-09-08 DIAGNOSIS — B2790 Infectious mononucleosis, unspecified without complication: Secondary | ICD-10-CM

## 2016-09-08 DIAGNOSIS — R5383 Other fatigue: Secondary | ICD-10-CM

## 2016-09-08 NOTE — Progress Notes (Signed)
Subjective:    Patient ID: Julia Jimenez, female    DOB: 1986/03/02, 30 y.o.   MRN: [id number]  HPI The patient is here for follow up.  She saw Tammy SoursGreg 1/17 and was very tired.  She thought it was mono.  Besides being very fatigued that has had lightheadedness, headaches and sore throat.  She has had mono in the past and it felt the same.  Her monospot test was positive.  She feels better this week, but still has some mild symptoms.  She first had mono in college and has not felt the same since.  She has had a low level of fatigue since then.  It get better/worse at times.  She had another episode of mono years ago and this current episode makes three episodes.  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   She is unsure if anything further should be done.  She knows it is a virus.  She has been under a lot of stress.    Medications and allergies reviewed with patient and updated if appropriate.  Patient Active Problem List   Diagnosis Date Noted  . Fatigue 08/25/2016  . Lightheadedness 08/25/2016  . Sinus congestion 09/22/2015  . Anxiety 09/22/2015  . 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 ADD (attention deficit disorder) 06/28/2014    Current Outpatient Prescriptions on File Prior to Visit  Medication Sig Dispense Refill  . amphetamine-dextroamphetamine (ADDERALL XR) 20 MG 24 hr capsule Take 20 mg by mouth as needed. Reported on 03/20/2016    . Cholecalciferol (VITAMIN D-3) 1000 units CAPS Take 1 capsule by mouth daily.    Marland Kitchen. FLUoxetine (PROZAC) 10 MG capsule TAKE ONE CAPSULE BY MOUTH EVERY DAY 90 capsule 1  . levonorgestrel (MIRENA) 20 MCG/24HR IUD 1 each by Intrauterine route once.    . Prenatal Vit-Fe Fumarate-FA (PRENATAL VITAMIN PO) Take 1 tablet by mouth daily.     No current facility-administered medications on file prior to visit.     Past Medical History:  Diagnosis Date  . Allergic rhinitis   . Anemia    in  high school  . Anxiety   . Elevated blood pressure reading without diagnosis of hypertension    in context of Adderall initiation  . History of chicken pox   . Ovarian cyst, right 2014    Past Surgical History:  Procedure Laterality Date  . TONSILLECTOMY AND ADENOIDECTOMY      Social History   Social History  . Marital status: Married    Spouse name: N/A  . Number of children: N/A  . Years of education: N/A   Social History Main Topics  . Smoking status: Never Smoker  . Smokeless tobacco: Not on file  . Alcohol use 1.2 oz/week    2 Standard drinks or equivalent per week     Comment: occasionally  . Drug use: No  . Sexual activity: Yes    Partners: Male    Birth control/ protection: IUD     Comment: Mirena IUD inserted 06-21-13   Other Topics Concern  . Not on file   Social History Narrative   Married    2 people living in household   Employed    Highest education level DDS   Occupation Dentist   7-8 hours of sleep a night     Family History  Problem Relation Age of Onset  . Hypertension Mother   . Diabetes  Father   . Hypertension Brother   . Diabetes Maternal Aunt   . Stroke Maternal Uncle   . Diabetes Maternal Uncle     x2  . Diabetes Paternal Grandmother   . Heart attack Paternal Grandmother   . Cancer Maternal Grandfather     Dec CA of unknown origin  . Hepatitis C Maternal Grandmother     Dec    Review of Systems  Constitutional: Positive for fatigue (better). Negative for fever.  HENT: Positive for sore throat (bettter).   Respiratory: Negative for cough, shortness of breath and wheezing.   Gastrointestinal: Positive for abdominal pain (occ sharp pains).  Musculoskeletal: Positive for myalgias (better). Negative for arthralgias.  Neurological: Positive for light-headedness (better) and headaches (better).       Objective:   Vitals:   09/08/16 1329  BP: 102/74  Pulse: 76  Resp: 16  Temp: 98.5 F (36.9 C)   Filed Weights   09/08/16  1329  Weight: 144 lb (65.3 kg)   Body mass index is 26.34 kg/m.   Physical Exam    Constitutional: Appears well-developed and well-nourished. No distress.  HENT:  Head: Normocephalic and atraumatic.  Neck: Neck supple. No tracheal deviation present. No thyromegaly present.  No cervical lymphadenopathy. Oropharynx without erythema Cardiovascular: Normal rate, regular rhythm and normal heart sounds.   No murmur heard. No carotid bruit .  No edema Pulmonary/Chest: Effort normal and breath sounds normal. No respiratory distress. No has no wheezes. No rales.  Abdomen: soft, non tender, non distended, no HSM Skin: Skin is warm and dry. Not diaphoretic.  Psychiatric: Normal mood and affect. Behavior is normal.      Assessment & Plan:    See Problem List for Assessment and Plan of chronic medical problems.

## 2016-09-08 NOTE — Progress Notes (Signed)
Pre visit review using our clinic review tool, if applicable. No additional management support is needed unless otherwise documented below in the visit note. 

## 2016-09-08 NOTE — Patient Instructions (Signed)
    Medications reviewed and updated.  No changes recommended at this time.     

## 2016-09-09 ENCOUNTER — Encounter: Payer: Self-pay | Admitting: Internal Medicine

## 2016-09-09 DIAGNOSIS — B2790 Infectious mononucleosis, unspecified without complication: Secondary | ICD-10-CM | POA: Insufficient documentation

## 2016-09-09 NOTE — Assessment & Plan Note (Signed)
Her symptoms were consistent with mono  - may have been another virus and monospot was false pos Symptoms improving with rest No additional treatment She questions whether this is chronic, recurrent of a different virus and I am not sure - can see ID for further eval if she wants Discussed keeping stress level to a minimum to prevent illness and help improve her chronic fatigue

## 2016-09-09 NOTE — Assessment & Plan Note (Signed)
Acute on chronic  - improved Continue regular exercise, good sleep Keep stress level low Continue current supplements

## 2016-11-26 ENCOUNTER — Encounter: Payer: BLUE CROSS/BLUE SHIELD | Admitting: Obstetrics & Gynecology

## 2016-12-10 ENCOUNTER — Ambulatory Visit (INDEPENDENT_AMBULATORY_CARE_PROVIDER_SITE_OTHER): Payer: BLUE CROSS/BLUE SHIELD | Admitting: Obstetrics & Gynecology

## 2016-12-10 ENCOUNTER — Encounter: Payer: Self-pay | Admitting: Obstetrics & Gynecology

## 2016-12-10 ENCOUNTER — Other Ambulatory Visit: Payer: Self-pay | Admitting: Obstetrics & Gynecology

## 2016-12-10 VITALS — BP 135/84 | HR 105 | Resp 16 | Ht 62.0 in | Wt 142.0 lb

## 2016-12-10 DIAGNOSIS — Z3169 Encounter for other general counseling and advice on procreation: Secondary | ICD-10-CM | POA: Diagnosis not present

## 2016-12-10 DIAGNOSIS — Z1379 Encounter for other screening for genetic and chromosomal anomalies: Secondary | ICD-10-CM

## 2016-12-10 DIAGNOSIS — Z30432 Encounter for removal of intrauterine contraceptive device: Secondary | ICD-10-CM | POA: Diagnosis not present

## 2016-12-11 NOTE — Progress Notes (Signed)
   Subjective:    Patient ID: Julia Jimenez, female    DOB: 1986/01/13, 31 y.o.   MRN: [id number]  HPI  Pt here to establish care and to have IUD removed.  Pt ready to conceive.  Had amenorrhea with IUD but could tell she was ovulating.  No pregnancies in the past.  No family history of genetic syndromes, congenital abnormalites, or developmental delays.  Pt wishes to be tested for   Review of Systems  Constitutional: Negative.   Cardiovascular: Negative.   Gastrointestinal: Negative.   Endocrine: Negative.   Genitourinary: Negative.   Psychiatric/Behavioral: Negative.    Past Medical History:  Diagnosis Date  . Allergic rhinitis   . Anemia    in high school  . Anxiety   . Elevated blood pressure reading without diagnosis of hypertension    in context of Adderall initiation  . History of chicken pox   . Ovarian cyst, right 2014      Objective:   Physical Exam  Constitutional: She is oriented to person, place, and time. She appears well-developed and well-nourished. No distress.  HENT:  Head: Normocephalic and atraumatic.  Eyes: Conjunctivae are normal.  Pulmonary/Chest: Effort normal.  Abdominal: Soft. Bowel sounds are normal. She exhibits no distension. There is no tenderness.  Genitourinary:  Genitourinary Comments: Tanner V Vulva--no lesion Vagina--pink nml rugae Cervix IUD strings seen, no lesion Uterus--normal size and contour  Musculoskeletal: She exhibits no edema.  Neurological: She is alert and oriented to person, place, and time.  Skin: Skin is warm and dry.  Psychiatric: She has a normal mood and affect.  Vitals reviewed.  Vitals:   12/10/16 1452  BP: 135/84  Pulse: (!) 105  Resp: 16  Weight: 142 lb (64.4 kg)  Height: 5\' 2"  (1.575 m)    Assessment & Plan:  31 yo female planning to conceive  1-IUD removal 2-PNV continue 3-CF testing  20 minutes spent face to face with patient, >50% counseling.  IUD Removal Patient identified, informed  consent performed. Discussed risks of irregular bleeding, cramping, infection, malpositioning or misplacement of the IUD outside the uterus which may require further procedures. Time out was performed. Speculum placed in the vagina. Cervix visualized. The strings of the IUD were grasped and pulled using ring forceps. The IUD was successfully removed in its entirety.

## 2016-12-22 LAB — CFVANTAGE CYSTIC FIB EXP SCREEN

## 2016-12-23 ENCOUNTER — Telehealth: Payer: Self-pay | Admitting: *Deleted

## 2016-12-23 NOTE — Telephone Encounter (Signed)
-----   Message from Lesly DukesKelly H Leggett, MD sent at 12/23/2016  2:26 PM EST ----- CF testing is negative!

## 2016-12-23 NOTE — Telephone Encounter (Signed)
LM on voicemail of neg CF testing

## 2017-05-04 ENCOUNTER — Ambulatory Visit (INDEPENDENT_AMBULATORY_CARE_PROVIDER_SITE_OTHER): Payer: BLUE CROSS/BLUE SHIELD | Admitting: Sports Medicine

## 2017-05-04 ENCOUNTER — Ambulatory Visit: Payer: Self-pay

## 2017-05-04 ENCOUNTER — Encounter: Payer: Self-pay | Admitting: Sports Medicine

## 2017-05-04 VITALS — BP 104/74 | Ht 62.0 in | Wt 140.0 lb

## 2017-05-04 DIAGNOSIS — M25512 Pain in left shoulder: Secondary | ICD-10-CM

## 2017-05-04 DIAGNOSIS — M7552 Bursitis of left shoulder: Secondary | ICD-10-CM | POA: Diagnosis not present

## 2017-05-04 NOTE — Progress Notes (Signed)
   Subjective:    Patient ID: Julia Jimenez, female    DOB: 1986-10-17, 31 y.o.   MRN: [id number]  HPI Julia Jimenez is a 31 yo female who presents with left shoulder pain which began 5 days ago. On Wednesday, she did a lot of upper extremity strengthening exercises. Thursday morning she felt fine but around noon that day she started having pain in her left lateral and anterior shoulder which significantly got worse through out the day to the point that she was not able to abduct her shoulder past about 20 degrees. She describes it as a sharp pain in the lateral and anterior shoulder. 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 Her symptoms improved over the weekend and she has more range of motion but she still has pain. No radiation of the pain down the arm. No numbness or tingling of the arm. No neck pain. Pain is worse with laying down at night. Has been using ibuprofen and her old prescription for opioid medication x 1. No prior history of shoulder pain/injury.   Soc Hx:  Patient is a dentist Left arm positions difficult 2/2 pain but improving  Review of Systems No radiation of pain to her left arm No numbness or tinging of her left arm/hand      Objective:   Physical Exam Gen: NAD Left Shoulder:  No assymmetry noted on inspection. No bony abnormality noted on inspection  Tenderness to palpation of the bicipital groove on the left shoulder. Otherwise to tenderness to palpation of structures of the shoulder.  Has full range of motion but has pain with flexion to 130 degrees, pain with abduction to about 60 degrees. Normal internal rotation and normal external rotation.  Normal strength but does have pain with testing of biceps and deltoid strength Negative empty can test Positive Hawkins Neurovascularly intact    Ultrasound of the left shoulder:  Normal biceps tendon at the bicipital  groove.  Normal infraspinatus, supraspinatus, subscapularis, teres minor  Hypoechoic region along the supraspinatus tendon consistent with bursitis; about 0.5cm in diameter. AC joint unremarkable  Impression; Ultrasound consistent with subacromial bursitis without evidence for rotator cuff tendinopathy.  Ultrasound and interpretation by Sibyl ParrKarl B. Fields, MD      Assessment & Plan:  Bursitis of the Shoulder, Left: symptoms are improving, therefore patient declined steroid injection to the subacromial space. Will continue NSAID as needed. Reviewed shoulder range of motion exercises to prevent frozen shoulder. Reviewed mild strengthening exercises and avoiding above the shoulder exercises for the next 2 weeks, then slowly restarting these activities.   Julia HolterKanishka G Gunadasa, MD PGY 2 Family Medicine   I observed and examined the patient with the resident and agree with assessment and plan.  Note reviewed and modified by me. Enid BaasKarl Fields, MD

## 2017-05-04 NOTE — Assessment & Plan Note (Signed)
Plan for conservative care  Gradual return to activity

## 2017-05-11 ENCOUNTER — Ambulatory Visit: Payer: BLUE CROSS/BLUE SHIELD | Admitting: Sports Medicine

## 2017-06-28 ENCOUNTER — Encounter: Payer: Self-pay | Admitting: Internal Medicine

## 2017-06-28 ENCOUNTER — Ambulatory Visit (INDEPENDENT_AMBULATORY_CARE_PROVIDER_SITE_OTHER): Payer: BLUE CROSS/BLUE SHIELD | Admitting: Internal Medicine

## 2017-06-28 DIAGNOSIS — B373 Candidiasis of vulva and vagina: Secondary | ICD-10-CM | POA: Diagnosis not present

## 2017-06-28 DIAGNOSIS — M549 Dorsalgia, unspecified: Secondary | ICD-10-CM | POA: Insufficient documentation

## 2017-06-28 DIAGNOSIS — M546 Pain in thoracic spine: Secondary | ICD-10-CM | POA: Diagnosis not present

## 2017-06-28 DIAGNOSIS — B3731 Acute candidiasis of vulva and vagina: Secondary | ICD-10-CM

## 2017-06-28 MED ORDER — HYDROCODONE-ACETAMINOPHEN 5-325 MG PO TABS: 1.0000 | ORAL_TABLET | Freq: Four times a day (QID) | ORAL | 0 refills | Status: DC | PRN

## 2017-06-28 MED ORDER — FLUCONAZOLE 150 MG PO TABS: 150.0000 mg | ORAL_TABLET | Freq: Once | ORAL | 0 refills | Status: AC

## 2017-06-28 MED ORDER — METHOCARBAMOL 500 MG PO TABS: 500.0000 mg | ORAL_TABLET | Freq: Three times a day (TID) | ORAL | 0 refills | Status: DC | PRN

## 2017-06-28 NOTE — Assessment & Plan Note (Signed)
Frequent or recurrent ? Related to frequent sex Has gyn appt in couple of weeks Using monistat and diflucan - ok to continue for now, but will avoid when trying to get pregnant

## 2017-06-28 NOTE — Progress Notes (Signed)
Subjective:    Patient ID: Julia Jimenez, female    DOB: 03-Mar-1986, 31 y.o.   MRN: [id number]  HPI She is here for an acute visit.   Back pain:  She gets neck pain occasionally and gets a massage, which helps.  She had this last week and the massage helped.  Then her middle back started hurting and it becames worse.  It was severe at one point.  Movement and breathing made the pain worse.  There was no improvement with advil.  She took hydrocodone and it helped.  She was able to sleep last night.  She denies injury or new activities.  She does exercise regularly. She is trying to get pregnant and knows she needs to be careful of the meds she takes.  Her back pain is better today, but she needs to get rid of it completely and wants to know what she can take. She denies radiating pain, numbness/tingling and weakness.   Yeast infection:  She keeps getting yeast infections.  She has gotten 4-5 over the past 3-4  Months.  She has taken diflucan once and then 72 hrs later and that often works.  She has tried [facility].  She is trying to get pregnant.  She is having sex a a lot and works out a lot.  She has an appointment early next month.      Medications and allergies reviewed with patient and updated if appropriate.  Patient Active Problem List   Diagnosis Date Noted  . Bursitis of shoulder, left 05/04/2017  . Infectious mononucleosis 09/09/2016  . Fatigue 08/25/2016  . Lightheadedness 08/25/2016  . Sinus congestion 09/22/2015  . Anxiety 09/22/2015  . 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 ADD (attention deficit disorder) 06/28/2014    Current Outpatient Prescriptions on File Prior to Visit  Medication Sig Dispense Refill  . amphetamine-dextroamphetamine (ADDERALL XR) 20 MG 24 hr capsule Take 20 mg by mouth as needed. Reported on 03/20/2016    . FLUoxetine (PROZAC) 10 MG capsule TAKE ONE CAPSULE BY MOUTH EVERY DAY 90 capsule 1  . levonorgestrel (MIRENA) 20 MCG/24HR IUD 1 each by  Intrauterine route once.    . Prenatal Vit-Fe Fumarate-FA (PRENATAL VITAMIN PO) Take 1 tablet by mouth daily.     No current facility-administered medications on file prior to visit.     Past Medical History:  Diagnosis Date  . Allergic rhinitis   . Anemia    in high school  . Anxiety   . Elevated blood pressure reading without diagnosis of hypertension    in context of Adderall initiation  . History of chicken pox   . Ovarian cyst, right 2014    Past Surgical History:  Procedure Laterality Date  . TONSILLECTOMY AND ADENOIDECTOMY    . WISDOM TOOTH EXTRACTION      Social History   Social History  . Marital status: Married    Spouse name: N/A  . Number of children: N/A  . Years of education: N/A   Occupational History  . Dentist    Social History Main Topics  . Smoking status: Never Smoker  . Smokeless tobacco: Never Used  . Alcohol use 1.2 oz/week    2 Standard drinks or equivalent per week     Comment: occasionally  . Drug use: No  . Sexual activity: Yes    Partners: Male    Birth control/ protection: IUD     Comment: Mirena IUD inserted 06-21-13   Other Topics Concern  .  Not on file   Social History Narrative   Married    2 people living in household   Employed    Highest education level DDS   Occupation Dentist   7-8 hours of sleep a night     Family History  Problem Relation Age of Onset  . Hypertension Mother   . Diabetes Father   . Hypertension Brother   . Diabetes Maternal Aunt   . Stroke Maternal Uncle   . Diabetes Maternal Uncle        x2  . Diabetes Paternal Grandmother   . Heart attack Paternal Grandmother   . Cancer Maternal Grandfather        Dec CA of unknown origin  . Hepatitis C Maternal Grandmother        Dec    Review of Systems  Constitutional: Negative for chills and fever.  Gastrointestinal: Negative for abdominal pain.  Genitourinary: Negative for dysuria, frequency and vaginal discharge.       Itching and burning  outside vaginal area - ? inside  Musculoskeletal: Positive for back pain (mid back). Negative for neck pain.  Neurological: Negative for weakness, light-headedness, numbness and headaches.       Objective:   Vitals:   06/28/17 1548  BP: 128/88  Pulse: 77  Resp: 16  SpO2: 99%   Filed Weights   06/28/17 1548  Weight: 150 lb (68 kg)   Body mass index is 27.44 kg/m.  Wt Readings from Last 3 Encounters:  06/28/17 150 lb (68 kg)  05/04/17 140 lb (63.5 kg)  12/10/16 142 lb (64.4 kg)     Physical Exam  Constitutional: She appears well-developed and well-nourished. No distress.  HENT:  Head: Normocephalic and atraumatic.  Genitourinary:  Genitourinary Comments: deferred  Musculoskeletal: She exhibits no edema.  Middle back non tender to palpation, thoracic spine non tender to palpation  Neurological:  Normal sensation and strength all extremities, gait normal  Skin: She is not diaphoretic.          Assessment & Plan:   See Problem List for Assessment and Plan of chronic medical problems.

## 2017-06-28 NOTE — Assessment & Plan Note (Signed)
Acute, middle back Likely muscular  Robaxin prn, hydrocodone prn - will not take when trying to get pregnant Heat, ice, stretching

## 2017-06-28 NOTE — Patient Instructions (Signed)
Take the diflucan and use monistat as we discussed.  Use the methocarbamol as the muscle relaxer as needed.  Take the hydrocodone as needed for severe pain.    Call if no improvement

## 2017-07-15 ENCOUNTER — Ambulatory Visit (INDEPENDENT_AMBULATORY_CARE_PROVIDER_SITE_OTHER): Payer: BLUE CROSS/BLUE SHIELD | Admitting: Obstetrics & Gynecology

## 2017-07-15 ENCOUNTER — Encounter: Payer: Self-pay | Admitting: Obstetrics & Gynecology

## 2017-07-15 VITALS — BP 110/70 | HR 91 | Resp 16 | Ht 62.0 in | Wt 148.0 lb

## 2017-07-15 DIAGNOSIS — L298 Other pruritus: Secondary | ICD-10-CM

## 2017-07-15 DIAGNOSIS — N898 Other specified noninflammatory disorders of vagina: Secondary | ICD-10-CM

## 2017-07-15 DIAGNOSIS — Z113 Encounter for screening for infections with a predominantly sexual mode of transmission: Secondary | ICD-10-CM | POA: Diagnosis not present

## 2017-07-15 MED ORDER — LETROZOLE 2.5 MG PO TABS: 2.5000 mg | ORAL_TABLET | Freq: Every day | ORAL | 0 refills | Status: DC

## 2017-07-15 NOTE — Progress Notes (Signed)
   Subjective:    Patient ID: Woodfin GanjaLauren Taylor Irvin, female    DOB: Apr 24, 1986, 31 y.o.   MRN: [id number]  HPI 31 year old G0 P0 female who presents for what feels like to be recurrent yeast infections and also to talk about infertility.    VAGINAL DISCHARGE She believes she's had a yeast infection on off the entire summer. She has taken Diflucan approximately 3 times that helps a little bit but it flares again. She has also tried numerous over-the-counter creams. She has some burning itching and pressure. No copious discharge.  INFERTILITY Patient had IUD removed in February. They've been having timed intercourse for 5 months without success. Patient taken home ovulation predictor kits and ovulating when she thinks she is ovulating. She usually has a menses 14 days after she ovulates. One cycle she was 2 days late. Patient has never been pregnant. Patient's partner has never conceived either. Patient denies any history of pelvic infection or bowel issues.   Review of Systems  Constitutional: Negative.   Cardiovascular: Negative.   Gastrointestinal: Negative.   Genitourinary: Positive for vaginal discharge. Negative for menstrual problem and pelvic pain.       Objective:   Physical Exam  Constitutional: She is oriented to person, place, and time. She appears well-developed and well-nourished. No distress.  HENT:  Head: Normocephalic and atraumatic.  Eyes: Conjunctivae are normal.  Pulmonary/Chest: Effort normal.  Abdominal: Soft. Bowel sounds are normal. There is no tenderness.  Genitourinary: Vagina normal.  Genitourinary Comments: Small amount of blood in the vault. Patient is on her menses.  Musculoskeletal: She exhibits no edema.  Neurological: She is alert and oriented to person, place, and time.  Skin: Skin is warm and dry.  Psychiatric: She has a normal mood and affect.  Vitals reviewed.  Vitals:   07/15/17 1500  BP: 110/70  Pulse: 91  Resp: 16  Weight: 148 lb (67.1 kg)   Height: 5\' 2"  (1.575 m)    Assessment & Plan:  31 year old female presenting for recurrent vaginal discharge and infertility  1.  We'll do Aptima for identification of yeast species as well as presence of BV 2.  Long discussion about infertility. Options for initial workup include semen analysis hysterosalpingogram and use of Femara.  Patient opts to do semen analysis and Femara. She is on day 3 of her cycle so she will start with 2.5 mg of Femara today.  We will plan 3 cycles of Femara and if no success patient will go see Dr. Tommi RumpsYalsinkaya.  35 minutes spent face-to-face with patient with greater than 50% counseling

## 2017-07-16 DIAGNOSIS — H5213 Myopia, bilateral: Secondary | ICD-10-CM | POA: Diagnosis not present

## 2017-07-16 DIAGNOSIS — H52223 Regular astigmatism, bilateral: Secondary | ICD-10-CM | POA: Diagnosis not present

## 2017-07-19 LAB — CERVICOVAGINAL ANCILLARY ONLY
BACTERIAL VAGINITIS: NEGATIVE
CANDIDA VAGINITIS: NEGATIVE
Chlamydia: NEGATIVE
NEISSERIA GONORRHEA: NEGATIVE
TRICH (WINDOWPATH): NEGATIVE

## 2017-07-26 ENCOUNTER — Encounter: Payer: Self-pay | Admitting: *Deleted

## 2017-07-28 ENCOUNTER — Encounter: Payer: Self-pay | Admitting: Obstetrics & Gynecology

## 2017-07-28 DIAGNOSIS — N4601 Organic azoospermia: Secondary | ICD-10-CM | POA: Insufficient documentation

## 2017-08-12 NOTE — Progress Notes (Signed)
Subjective:    Patient ID: Julia Jimenez, female    DOB: Mar 27, 1986, 31 y.o.   MRN: [id number]  HPI She is here for an acute visit.   Knots in neck:   She has a lump on the right side of her neck that has been there a while and has not changed. It is mobile and slightly squishy. Recently a new lump popped up next to it. She first noticed it several weeks ago and she does not think it has changed. She denies any other palpable lumps or lymph nodes. She did have a cold 2 weeks ago, but this was after she first noticed it. 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 She denies any fevers, chills, change in appetite, weight loss, night sweats. She denies any current cold symptoms.   Sprained ankle two months ago:   She stepped wrong and it popped.  She had pain and swelling on the lateral aspect of her left ankle. The area still is tender at times when she pushes on it and if she wears heels and feels weak or unstable. It does not swell at all. She is able to work out on a daily basis without any difficulty. She was concerned that she was still having some pain in that she needs to do anything further.    Medications and allergies reviewed with patient and updated if appropriate.  Patient Active Problem List   Diagnosis Date Noted  . 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 Back pain 06/28/2017  . Vaginal yeast infection 06/28/2017  . Bursitis of shoulder, left 05/04/2017  . Infectious mononucleosis 09/09/2016  . Fatigue 08/25/2016  . Lightheadedness 08/25/2016  . Sinus congestion 09/22/2015  . Anxiety 09/22/2015  . Non-allergic rhinitis 06/28/2014    Current Outpatient Prescriptions on File Prior to Visit  Medication Sig Dispense Refill  . letrozole (FEMARA) 2.5 MG tablet Take 1 tablet (2.5 mg total) by mouth daily. 5 tablet 0  . Prenatal Vit-Fe Fumarate-FA (PRENATAL VITAMIN PO) Take 1  tablet by mouth daily.    . Probiotic Product (PROBIOTIC-10 PO) Take by mouth.    . methocarbamol (ROBAXIN) 500 MG tablet Take 1 tablet (500 mg total) by mouth every 8 (eight) hours as needed for muscle spasms. (Patient not taking: Reported on 07/15/2017) 20 tablet 0   No current facility-administered medications on file prior to visit.     Past Medical History:  Diagnosis Date  . Allergic rhinitis   . Anemia    in high school  . Anxiety   . Elevated blood pressure reading without diagnosis of hypertension    in context of Adderall initiation  . History of chicken pox   . Ovarian cyst, right 2014    Past Surgical History:  Procedure Laterality Date  . TONSILLECTOMY AND ADENOIDECTOMY    . WISDOM TOOTH EXTRACTION      Social History   Social History  . Marital status: Married    Spouse name: N/A  . Number of children: N/A  . Years of education: N/A   Occupational History  . Dentist    Social History Main Topics  . Smoking status: Never Smoker  . Smokeless tobacco: Never Used  . Alcohol use 1.2 oz/week    2 Standard drinks or equivalent per week     Comment: occasionally  . Drug use: No  . Sexual activity: Yes  Partners: Male    Birth control/ protection: None     Comment: Mirena IUD inserted 06-21-13   Other Topics Concern  . None   Social History Narrative   Married    2 people living in household   Employed    Highest education level DDS   Occupation Dentist   7-8 hours of sleep a night     Family History  Problem Relation Age of Onset  . Hypertension Mother   . Diabetes Father   . Hypertension Brother   . Diabetes Maternal Aunt   . Stroke Maternal Uncle   . Diabetes Maternal Uncle        x2  . Diabetes Paternal Grandmother   . Heart attack Paternal Grandmother   . Cancer Maternal Grandfather        Dec CA of unknown origin  . Hepatitis C Maternal Grandmother        Dec    Review of Systems  Constitutional: Negative for chills, diaphoresis,  fever and unexpected weight change.  HENT: Negative for congestion, ear pain, sinus pain and sore throat.   Respiratory: Negative for cough, shortness of breath and wheezing.   Neurological: Negative for light-headedness and headaches.       Objective:   Vitals:   08/13/17 1341  BP: 120/80  Pulse: 87  Resp: 16  Temp: 98.7 F (37.1 C)  SpO2: 99%   Filed Weights   08/13/17 1341  Weight: 150 lb (68 kg)   Body mass index is 27.44 kg/m.  Wt Readings from Last 3 Encounters:  08/13/17 150 lb (68 kg)  07/15/17 148 lb (67.1 kg)  06/28/17 150 lb (68 kg)     Physical Exam  Constitutional: She appears well-developed and well-nourished. No distress.  HENT:  Head: Normocephalic and atraumatic.  Right Ear: External ear normal.  Left Ear: External ear normal.  Mouth/Throat: Oropharynx is clear and moist.  Bilateral ear canals and tympanic membranes normal  Eyes: Conjunctivae are normal.  Neck: Neck supple. No tracheal deviation present. No thyromegaly present.  Palpable lump right neck posterior to the SCM that is mobile squishy-feels like a possible lipoma, palpable pea-sized lump just above this is nontender, palpable left anterior cervical lymph node that is nontender, no other palpable lymph nodes in the neck including left side, occipital chain, supraclavicular lymph nodes or submandibular lymph nodes  Musculoskeletal: She exhibits no edema.  Mild tenderness left lateral ankle anterior to malleolus along ligament, no swelling or skin discoloration, no deformity, full range of motion and no weakness  Skin: Skin is warm and dry. No rash noted. She is not diaphoretic. No erythema.          Assessment & Plan:   See Problem List for Assessment and Plan of chronic medical problems.

## 2017-08-13 ENCOUNTER — Encounter: Payer: Self-pay | Admitting: Internal Medicine

## 2017-08-13 ENCOUNTER — Ambulatory Visit (INDEPENDENT_AMBULATORY_CARE_PROVIDER_SITE_OTHER): Payer: BLUE CROSS/BLUE SHIELD | Admitting: Internal Medicine

## 2017-08-13 DIAGNOSIS — R229 Localized swelling, mass and lump, unspecified: Secondary | ICD-10-CM | POA: Diagnosis not present

## 2017-08-13 DIAGNOSIS — S93402A Sprain of unspecified ligament of left ankle, initial encounter: Secondary | ICD-10-CM

## 2017-08-13 NOTE — Assessment & Plan Note (Signed)
Occurred 2 months ago and her pain has improved significantly, but she still has mild pain Advised this will slowly improve, but it is not uncommon for symptoms to last 12 weeks If her symptoms persist after this can have her see sports medicine, but I do not think that is necessary given her daily exercise without limitations

## 2017-08-13 NOTE — Assessment & Plan Note (Signed)
She has a couple of palpable lymph nodes on the right side of her neck that are likely reactive, one of them feels like a lipoma and is unchanged She is not having any concerning symptoms concerning possible cancer or cold symptoms We discussed possible further evaluation, but given the lack of concerning symptoms we will just monitor for now If the lungs persist will consider imaging or ENT referral

## 2017-08-13 NOTE — Patient Instructions (Signed)
Monitor your neck lymph nodes and if they do not go away or get larger please let me know.   No change in medication.

## 2017-09-17 DIAGNOSIS — N979 Female infertility, unspecified: Secondary | ICD-10-CM | POA: Diagnosis not present

## 2017-09-17 DIAGNOSIS — Z319 Encounter for procreative management, unspecified: Secondary | ICD-10-CM | POA: Diagnosis not present

## 2017-09-23 NOTE — Progress Notes (Signed)
Subjective:    Patient ID: Julia Jimenez, female    DOB: 1986-09-15, 31 y.o.   MRN: [id number]  HPI She is here for a physical exam.   She is having some increased anxiety and does wants to start sertraline.  She is going through infertility/ trying to get pregnant in the next couple of months and wants to be on something safe for pregnancy - her gyn is good with sertraline.   Her cervical lymphadenopathy is unchanged.  She denies cold symptoms, fever or chills.      Medications and allergies reviewed with patient and updated if appropriate.  Patient Active Problem List   Diagnosis Date Noted  . Sprain of left ankle 08/13/2017  . Skin lumps 08/13/2017  . 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 Back pain 06/28/2017  . Vaginal yeast infection 06/28/2017  . Bursitis of shoulder, left 05/04/2017  . Infectious mononucleosis 09/09/2016  . Fatigue 08/25/2016  . Lightheadedness 08/25/2016  . Sinus congestion 09/22/2015  . Anxiety 09/22/2015  . 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 methocarbamol (ROBAXIN) 500 MG tablet Take 1 tablet (500 mg total) by mouth every 8 (eight) hours as needed for muscle spasms. 20 tablet 0  . Prenatal Vit-Fe Fumarate-FA (PRENATAL VITAMIN PO) Take 1 tablet by mouth daily.    . Probiotic Product (PROBIOTIC-10 PO) Take by mouth.     No current facility-administered medications on file prior to visit.     Past Medical History:  Diagnosis Date  . Allergic rhinitis   . Anemia    in high school  . Anxiety   . Elevated blood pressure reading without diagnosis of hypertension    in context of Adderall initiation  . History of chicken pox   . Ovarian cyst, right 2014    Past Surgical History:  Procedure Laterality Date  . TONSILLECTOMY AND ADENOIDECTOMY    . WISDOM TOOTH EXTRACTION      Social History   Socioeconomic History  . Marital status:  Married    Spouse name: None  . Number of children: None  . Years of education: None  . Highest education level: None  Social Needs  . Financial resource strain: None  . Food insecurity - worry: None  . Food insecurity - inability: None  . Transportation needs - medical: None  . Transportation needs - non-medical: None  Occupational History  . Occupation: Education officer, communityDentist  Tobacco Use  . Smoking status: Never Smoker  . Smokeless tobacco: Never Used  Substance and Sexual Activity  . Alcohol use: Yes    Alcohol/week: 1.2 oz    Types: 2 Standard drinks or equivalent per week    Comment: occasionally  . Drug use: No  . Sexual activity: Yes    Partners: Male    Birth control/protection: None    Comment: Mirena IUD inserted 06-21-13  Other Topics Concern  . None  Social History Narrative   Married    2 people living in household   Employed    Highest education level DDS   Occupation Dentist   7-8 hours of sleep a night     Family History  Problem Relation Age of Onset  . Hypertension Mother   . Depression Mother   . Anxiety disorder Mother   . Diabetes Father   . Hypertension Brother   . Depression Brother   . Diabetes Maternal Aunt   .  Stroke Maternal Uncle   . Diabetes Maternal Uncle        x2  . Diabetes Paternal Grandmother   . Heart attack Paternal Grandmother   . Cancer Maternal Grandfather        Dec CA of unknown origin  . Hepatitis C Maternal Grandmother        Dec    Review of Systems  Constitutional: Positive for fatigue (chronic). Negative for chills and fever.  Eyes: Negative for visual disturbance.  Respiratory: Negative for cough, shortness of breath and wheezing.   Cardiovascular: Negative for chest pain, palpitations and leg swelling.  Gastrointestinal: Positive for abdominal distention (increased gas with certain diets). Negative for abdominal pain, blood in stool, constipation, diarrhea and nausea.       Sometimes diarrhea/contstipation with probable  IBS  Genitourinary: Negative for dysuria and hematuria.  Musculoskeletal: Positive for arthralgias (mild ankle pain) and neck pain (occ takes methocarbamol). Negative for back pain.  Skin: Negative for color change.  Neurological: Positive for light-headedness (occ). Negative for headaches.  Psychiatric/Behavioral: Negative for dysphoric mood. The patient is nervous/anxious.        Objective:   Vitals:   09/24/17 1412  BP: 122/84  Pulse: 92  Resp: 16  Temp: 97.9 F (36.6 C)  SpO2: 99%   Filed Weights   09/24/17 1412  Weight: 153 lb (69.4 kg)   Body mass index is 27.98 kg/m.  Wt Readings from Last 3 Encounters:  09/24/17 153 lb (69.4 kg)  08/13/17 150 lb (68 kg)  07/15/17 148 lb (67.1 kg)     Physical Exam Constitutional: She appears well-developed and well-nourished. No distress.  HENT:  Head: Normocephalic and atraumatic.  Right Ear: External ear normal. Normal ear canal and TM Left Ear: External ear normal.  Normal ear canal and TM Mouth/Throat: Oropharynx is clear and moist.  Eyes: Conjunctivae and EOM are normal.  Neck: Neck supple. No tracheal deviation present. No thyromegaly present.  No carotid bruit  Cardiovascular: Normal rate, regular rhythm and normal heart sounds.   No murmur heard.  No edema. Pulmonary/Chest: Effort normal and breath sounds normal. No respiratory distress. She has no wheezes. She has no rales.  Breast: deferred to Gyn Abdominal: Soft. She exhibits no distension. There is no tenderness.  Lymphadenopathy: She has cervical adenopathy that is unchanged in left posterior cervical lymph node chain.  Skin: Skin is warm and dry. She is not diaphoretic.  Psychiatric: She has a normal mood and affect. Her behavior is normal.        Assessment & Plan:   Physical exam: Screening blood work  ordered Immunizations  Up to date  Gyn   Up to date  Exercise   regular Weight  Overweight - working on weight loss Skin     no concerns Substance  abuse  none  See Problem List for Assessment and Plan of chronic medical problems.   FU in one year

## 2017-09-23 NOTE — Patient Instructions (Addendum)
[email or web address] for IBS diets   Test(s) ordered today. Your results will be released to [facility] (or called to you) after review, usually within 72hours after test completion. If any changes need to be made, you will be notified at that same time.  All other Health Maintenance issues reviewed.   All recommended immunizations and age-appropriate screenings are up-to-date or discussed.  No immunizations administered today.   Medications reviewed and updated.  Changes include starting sertraline 50 mg daily  Your prescription(s) have been submitted to your pharmacy. Please take as directed and contact our office if you believe you are having problem(s) with the medication(s).  Please followup in one year   Health Maintenance, Female Adopting a healthy lifestyle and getting preventive care can go a long way to promote health and wellness. Talk with your health care provider about what schedule of regular examinations is right for you. This is a good chance for you to check in with your provider about disease prevention and staying healthy. In between checkups, there are plenty of things you can do on your own. Experts have done a lot of research about which lifestyle changes and preventive measures are most likely to keep you healthy. Ask your health care provider for more information. Weight and diet Eat a healthy diet  Be sure to include plenty of vegetables, fruits, low-fat dairy products, and lean protein.  Do not eat a lot of foods high in solid fats, added sugars, or salt.  Get regular exercise. This is one of the most important things you can do for your health. ? Most adults should exercise for at least 150 minutes each week. The exercise should increase your heart rate and make you sweat (moderate-intensity exercise). ? Most adults should also do strengthening exercises at least twice a week. This is in addition to the moderate-intensity exercise.  Maintain a healthy weight  Body  mass index (BMI) is a measurement that can be used to identify possible weight problems. It estimates body fat based on height and weight. Your health care provider can help determine your BMI and help you achieve or maintain a healthy weight.  For females 14 years of age and older: ? A BMI below 18.5 is considered underweight. ? A BMI of 18.5 to 24.9 is normal. ? A BMI of 25 to 29.9 is considered overweight. ? A BMI of 30 and above is considered obese.  Watch levels of cholesterol and blood lipids  You should start having your blood tested for lipids and cholesterol at 32 years of age, then have this test every 5 years.  You may need to have your cholesterol levels checked more often if: ? Your lipid or cholesterol levels are high. ? You are older than 32 years of age. ? You are at high risk for heart disease.  Cancer screening Lung Cancer  Lung cancer screening is recommended for adults 57-3 years old who are at high risk for lung cancer because of a history of smoking.  A yearly low-dose CT scan of the lungs is recommended for people who: ? Currently smoke. ? Have quit within the past 15 years. ? Have at least a 30-pack-year history of smoking. A pack year is smoking an average of one pack of cigarettes a day for 1 year.  Yearly screening should continue until it has been 15 years since you quit.  Yearly screening should stop if you develop a health problem that would prevent you from having lung cancer treatment.  Breast Cancer  Practice breast self-awareness. This means understanding how your breasts normally appear and feel.  It also means doing regular breast self-exams. Let your health care provider know about any changes, no matter how small.  If you are in your 20s or 30s, you should have a clinical breast exam (CBE) by a health care provider every 1-3 years as part of a regular health exam.  If you are 30 or older, have a CBE every year. Also consider having a  breast X-ray (mammogram) every year.  If you have a family history of breast cancer, talk to your health care provider about genetic screening.  If you are at high risk for breast cancer, talk to your health care provider about having an MRI and a mammogram every year.  Breast cancer gene (BRCA) assessment is recommended for women who have family members with BRCA-related cancers. BRCA-related cancers include: ? Breast. ? Ovarian. ? Tubal. ? Peritoneal cancers.  Results of the assessment will determine the need for genetic counseling and BRCA1 and BRCA2 testing.  Cervical Cancer Your health care provider may recommend that you be screened regularly for cancer of the pelvic organs (ovaries, uterus, and vagina). This screening involves a pelvic examination, including checking for microscopic changes to the surface of your cervix (Pap test). You may be encouraged to have this screening done every 3 years, beginning at age 106.  For women ages 38-65, health care providers may recommend pelvic exams and Pap testing every 3 years, or they may recommend the Pap and pelvic exam, combined with testing for human papilloma virus (HPV), every 5 years. Some types of HPV increase your risk of cervical cancer. Testing for HPV may also be done on women of any age with unclear Pap test results.  Other health care providers may not recommend any screening for nonpregnant women who are considered low risk for pelvic cancer and who do not have symptoms. Ask your health care provider if a screening pelvic exam is right for you.  If you have had past treatment for cervical cancer or a condition that could lead to cancer, you need Pap tests and screening for cancer for at least 20 years after your treatment. If Pap tests have been discontinued, your risk factors (such as having a new sexual partner) need to be reassessed to determine if screening should resume. Some women have medical problems that increase the chance  of getting cervical cancer. In these cases, your health care provider may recommend more frequent screening and Pap tests.  Colorectal Cancer  This type of cancer can be detected and often prevented.  Routine colorectal cancer screening usually begins at 32 years of age and continues through 32 years of age.  Your health care provider may recommend screening at an earlier age if you have risk factors for colon cancer.  Your health care provider may also recommend using home test kits to check for hidden blood in the stool.  A small camera at the end of a tube can be used to examine your colon directly (sigmoidoscopy or colonoscopy). This is done to check for the earliest forms of colorectal cancer.  Routine screening usually begins at age 64.  Direct examination of the colon should be repeated every 5-10 years through 32 years of age. However, you may need to be screened more often if early forms of precancerous polyps or small growths are found.  Skin Cancer  Check your skin from head to toe regularly.  Tell  your health care provider about any new moles or changes in moles, especially if there is a change in a mole's shape or color.  Also tell your health care provider if you have a mole that is larger than the size of a pencil eraser.  Always use sunscreen. Apply sunscreen liberally and repeatedly throughout the day.  Protect yourself by wearing long sleeves, pants, a wide-brimmed hat, and sunglasses whenever you are outside.  Heart disease, diabetes, and high blood pressure  High blood pressure causes heart disease and increases the risk of stroke. High blood pressure is more likely to develop in: ? People who have blood pressure in the high end of the normal range (130-139/85-89 mm Hg). ? People who are overweight or obese. ? People who are African American.  If you are 39-65 years of age, have your blood pressure checked every 3-5 years. If you are 20 years of age or older,  have your blood pressure checked every year. You should have your blood pressure measured twice-once when you are at a hospital or clinic, and once when you are not at a hospital or clinic. Record the average of the two measurements. To check your blood pressure when you are not at a hospital or clinic, you can use: ? An automated blood pressure machine at a pharmacy. ? A home blood pressure monitor.  If you are between 56 years and 32 years old, ask your health care provider if you should take aspirin to prevent strokes.  Have regular diabetes screenings. This involves taking a blood sample to check your fasting blood sugar level. ? If you are at a normal weight and have a low risk for diabetes, have this test once every three years after 32 years of age. ? If you are overweight and have a high risk for diabetes, consider being tested at a younger age or more often. Preventing infection Hepatitis B  If you have a higher risk for hepatitis B, you should be screened for this virus. You are considered at high risk for hepatitis B if: ? You were born in a country where hepatitis B is common. Ask your health care provider which countries are considered high risk. ? Your parents were born in a high-risk country, and you have not been immunized against hepatitis B (hepatitis B vaccine). ? You have HIV or AIDS. ? You use needles to inject street drugs. ? You live with someone who has hepatitis B. ? You have had sex with someone who has hepatitis B. ? You get hemodialysis treatment. ? You take certain medicines for conditions, including cancer, organ transplantation, and autoimmune conditions.  Hepatitis C  Blood testing is recommended for: ? Everyone born from [birth year] through 1966. ? Anyone with known risk factors for hepatitis C.  Sexually transmitted infections (STIs)  You should be screened for sexually transmitted infections (STIs) including gonorrhea and chlamydia if: ? You are sexually  active and are younger than 32 years of age. ? You are older than 32 years of age and your health care provider tells you that you are at risk for this type of infection. ? Your sexual activity has changed since you were last screened and you are at an increased risk for chlamydia or gonorrhea. Ask your health care provider if you are at risk.  If you do not have HIV, but are at risk, it may be recommended that you take a prescription medicine daily to prevent HIV infection. This is called pre-exposure  prophylaxis (PrEP). You are considered at risk if: ? You are sexually active and do not regularly use condoms or know the HIV status of your partner(s). ? You take drugs by injection. ? You are sexually active with a partner who has HIV.  Talk with your health care provider about whether you are at high risk of being infected with HIV. If you choose to begin PrEP, you should first be tested for HIV. You should then be tested every 3 months for as long as you are taking PrEP. Pregnancy  If you are premenopausal and you may become pregnant, ask your health care provider about preconception counseling.  If you may become pregnant, take 400 to 800 micrograms (mcg) of folic acid every day.  If you want to prevent pregnancy, talk to your health care provider about birth control (contraception). Osteoporosis and menopause  Osteoporosis is a disease in which the bones lose minerals and strength with aging. This can result in serious bone fractures. Your risk for osteoporosis can be identified using a bone density scan.  If you are 21 years of age or older, or if you are at risk for osteoporosis and fractures, ask your health care provider if you should be screened.  Ask your health care provider whether you should take a calcium or vitamin D supplement to lower your risk for osteoporosis.  Menopause may have certain physical symptoms and risks.  Hormone replacement therapy may reduce some of these  symptoms and risks. Talk to your health care provider about whether hormone replacement therapy is right for you. Follow these instructions at home:  Schedule regular health, dental, and eye exams.  Stay current with your immunizations.  Do not use any tobacco products including cigarettes, chewing tobacco, or electronic cigarettes.  If you are pregnant, do not drink alcohol.  If you are breastfeeding, limit how much and how often you drink alcohol.  Limit alcohol intake to no more than 1 drink per day for nonpregnant women. One drink equals 12 ounces of beer, 5 ounces of wine, or 1 ounces of hard liquor.  Do not use street drugs.  Do not share needles.  Ask your health care provider for help if you need support or information about quitting drugs.  Tell your health care provider if you often feel depressed.  Tell your health care provider if you have ever been abused or do not feel safe at home. This information is not intended to replace advice given to you by your health care provider. Make sure you discuss any questions you have with your health care provider. Document Released: 05/11/2011 Document Revised: 04/02/2016 Document Reviewed: 07/30/2015 Elsevier Interactive Patient Education  [employer].

## 2017-09-24 ENCOUNTER — Encounter: Payer: Self-pay | Admitting: Internal Medicine

## 2017-09-24 ENCOUNTER — Other Ambulatory Visit (INDEPENDENT_AMBULATORY_CARE_PROVIDER_SITE_OTHER): Payer: BLUE CROSS/BLUE SHIELD

## 2017-09-24 ENCOUNTER — Ambulatory Visit (INDEPENDENT_AMBULATORY_CARE_PROVIDER_SITE_OTHER): Payer: BLUE CROSS/BLUE SHIELD | Admitting: Internal Medicine

## 2017-09-24 VITALS — BP 122/84 | HR 92 | Temp 97.9°F | Resp 16 | Ht 62.0 in | Wt 153.0 lb

## 2017-09-24 DIAGNOSIS — Z833 Family history of diabetes mellitus: Secondary | ICD-10-CM

## 2017-09-24 DIAGNOSIS — F419 Anxiety disorder, unspecified: Secondary | ICD-10-CM

## 2017-09-24 DIAGNOSIS — Z0000 Encounter for general adult medical examination without abnormal findings: Secondary | ICD-10-CM

## 2017-09-24 DIAGNOSIS — R590 Localized enlarged lymph nodes: Secondary | ICD-10-CM | POA: Diagnosis not present

## 2017-09-24 LAB — CBC WITH DIFFERENTIAL/PLATELET
BASOS ABS: 0.1 10*3/uL (ref 0.0–0.1)
Basophils Relative: 0.8 % (ref 0.0–3.0)
EOS PCT: 3.2 % (ref 0.0–5.0)
Eosinophils Absolute: 0.2 10*3/uL (ref 0.0–0.7)
HEMATOCRIT: 41.8 % (ref 36.0–46.0)
Hemoglobin: 13.7 g/dL (ref 12.0–15.0)
LYMPHS ABS: 2.5 10*3/uL (ref 0.7–4.0)
LYMPHS PCT: 33.1 % (ref 12.0–46.0)
MCHC: 32.8 g/dL (ref 30.0–36.0)
MCV: 86.8 fl (ref 78.0–100.0)
MONOS PCT: 7.8 % (ref 3.0–12.0)
Monocytes Absolute: 0.6 10*3/uL (ref 0.1–1.0)
NEUTROS ABS: 4.2 10*3/uL (ref 1.4–7.7)
NEUTROS PCT: 55.1 % (ref 43.0–77.0)
PLATELETS: 209 10*3/uL (ref 150.0–400.0)
RBC: 4.82 Mil/uL (ref 3.87–5.11)
RDW: 13.6 % (ref 11.5–15.5)
WBC: 7.6 10*3/uL (ref 4.0–10.5)

## 2017-09-24 LAB — COMPREHENSIVE METABOLIC PANEL
ALT: 30 U/L (ref 0–35)
AST: 31 U/L (ref 0–37)
Albumin: 4.2 g/dL (ref 3.5–5.2)
Alkaline Phosphatase: 38 U/L — ABNORMAL LOW (ref 39–117)
BUN: 13 mg/dL (ref 6–23)
CALCIUM: 9.5 mg/dL (ref 8.4–10.5)
CHLORIDE: 102 meq/L (ref 96–112)
CO2: 30 meq/L (ref 19–32)
Creatinine, Ser: 0.92 mg/dL (ref 0.40–1.20)
GFR: 75.03 mL/min (ref >60.00)
GLUCOSE: 109 mg/dL — AB (ref 70–99)
POTASSIUM: 3.6 meq/L (ref 3.5–5.1)
Sodium: 140 mEq/L (ref 135–145)
Total Bilirubin: 0.4 mg/dL (ref 0.2–1.2)
Total Protein: 6.8 g/dL (ref 6.0–8.3)

## 2017-09-24 LAB — LIPID PANEL
CHOLESTEROL: 161 mg/dL (ref 0–200)
HDL: 78.3 mg/dL (ref >39.00)
LDL Cholesterol: 57 mg/dL (ref 0–99)
NONHDL: 83.18
Total CHOL/HDL Ratio: 2
Triglycerides: 129 mg/dL (ref 0.0–149.0)
VLDL: 25.8 mg/dL (ref 0.0–40.0)

## 2017-09-24 LAB — TSH: TSH: 1.26 u[IU]/mL (ref 0.35–4.50)

## 2017-09-24 LAB — HEMOGLOBIN A1C: Hgb A1c MFr Bld: 5.5 % (ref 4.6–6.5)

## 2017-09-24 MED ORDER — SERTRALINE HCL 50 MG PO TABS: 50.0000 mg | ORAL_TABLET | Freq: Every day | ORAL | 3 refills | Status: DC

## 2017-09-24 NOTE — Assessment & Plan Note (Signed)
Stable She will continue to monitor Checking cbc, cmp, tsh

## 2017-09-24 NOTE — Assessment & Plan Note (Signed)
Increased Will start sertraline 50 mg daily

## 2017-09-24 NOTE — Assessment & Plan Note (Signed)
a1c

## 2017-09-25 ENCOUNTER — Encounter: Payer: Self-pay | Admitting: Internal Medicine

## 2017-10-21 DIAGNOSIS — F411 Generalized anxiety disorder: Secondary | ICD-10-CM | POA: Diagnosis not present

## 2017-11-12 DIAGNOSIS — N978 Female infertility of other origin: Secondary | ICD-10-CM | POA: Diagnosis not present

## 2017-11-12 DIAGNOSIS — N8500 Endometrial hyperplasia, unspecified: Secondary | ICD-10-CM | POA: Diagnosis not present

## 2017-11-12 DIAGNOSIS — Z3183 Encounter for assisted reproductive fertility procedure cycle: Secondary | ICD-10-CM | POA: Diagnosis not present

## 2017-11-12 DIAGNOSIS — Z3141 Encounter for fertility testing: Secondary | ICD-10-CM | POA: Diagnosis not present

## 2017-11-12 DIAGNOSIS — Z319 Encounter for procreative management, unspecified: Secondary | ICD-10-CM | POA: Diagnosis not present

## 2017-11-19 DIAGNOSIS — F411 Generalized anxiety disorder: Secondary | ICD-10-CM | POA: Diagnosis not present

## 2017-12-03 DIAGNOSIS — N978 Female infertility of other origin: Secondary | ICD-10-CM | POA: Diagnosis not present

## 2017-12-03 DIAGNOSIS — Z3183 Encounter for assisted reproductive fertility procedure cycle: Secondary | ICD-10-CM | POA: Diagnosis not present

## 2017-12-10 DIAGNOSIS — N978 Female infertility of other origin: Secondary | ICD-10-CM | POA: Diagnosis not present

## 2017-12-10 DIAGNOSIS — Z3183 Encounter for assisted reproductive fertility procedure cycle: Secondary | ICD-10-CM | POA: Diagnosis not present

## 2017-12-16 DIAGNOSIS — Z3141 Encounter for fertility testing: Secondary | ICD-10-CM | POA: Diagnosis not present

## 2018-01-10 ENCOUNTER — Other Ambulatory Visit: Payer: Self-pay | Admitting: Internal Medicine

## 2018-01-10 DIAGNOSIS — Z3183 Encounter for assisted reproductive fertility procedure cycle: Secondary | ICD-10-CM | POA: Diagnosis not present

## 2018-01-31 DIAGNOSIS — N978 Female infertility of other origin: Secondary | ICD-10-CM | POA: Diagnosis not present

## 2018-01-31 DIAGNOSIS — Z3183 Encounter for assisted reproductive fertility procedure cycle: Secondary | ICD-10-CM | POA: Diagnosis not present

## 2018-02-14 DIAGNOSIS — N978 Female infertility of other origin: Secondary | ICD-10-CM | POA: Diagnosis not present

## 2018-02-14 DIAGNOSIS — Z3201 Encounter for pregnancy test, result positive: Secondary | ICD-10-CM | POA: Diagnosis not present

## 2018-02-14 DIAGNOSIS — Z3183 Encounter for assisted reproductive fertility procedure cycle: Secondary | ICD-10-CM | POA: Diagnosis not present

## 2018-02-14 DIAGNOSIS — Z3200 Encounter for pregnancy test, result unknown: Secondary | ICD-10-CM | POA: Diagnosis not present

## 2018-02-21 DIAGNOSIS — N8500 Endometrial hyperplasia, unspecified: Secondary | ICD-10-CM | POA: Diagnosis not present

## 2018-02-21 DIAGNOSIS — Z3141 Encounter for fertility testing: Secondary | ICD-10-CM | POA: Diagnosis not present

## 2018-03-31 DIAGNOSIS — F411 Generalized anxiety disorder: Secondary | ICD-10-CM | POA: Diagnosis not present

## 2018-04-05 DIAGNOSIS — F411 Generalized anxiety disorder: Secondary | ICD-10-CM | POA: Diagnosis not present

## 2018-04-14 DIAGNOSIS — F411 Generalized anxiety disorder: Secondary | ICD-10-CM | POA: Diagnosis not present

## 2018-04-25 DIAGNOSIS — F411 Generalized anxiety disorder: Secondary | ICD-10-CM | POA: Diagnosis not present

## 2018-05-09 DIAGNOSIS — F411 Generalized anxiety disorder: Secondary | ICD-10-CM | POA: Diagnosis not present

## 2018-05-11 ENCOUNTER — Telehealth: Payer: Self-pay

## 2018-05-11 DIAGNOSIS — B379 Candidiasis, unspecified: Secondary | ICD-10-CM

## 2018-05-11 MED ORDER — FLUCONAZOLE 150 MG PO TABS: 150.0000 mg | ORAL_TABLET | Freq: Once | ORAL | 0 refills | Status: AC

## 2018-05-11 NOTE — Telephone Encounter (Signed)
Pt called after hour nurse line stating she is leaving for the beach and feels like she has a yeast infection. Diflucan sent to pharmacy per Dr.Constant.

## 2018-05-16 ENCOUNTER — Telehealth: Payer: Self-pay

## 2018-05-16 DIAGNOSIS — B379 Candidiasis, unspecified: Secondary | ICD-10-CM

## 2018-05-16 MED ORDER — FLUCONAZOLE 150 MG PO TABS: 150.0000 mg | ORAL_TABLET | Freq: Once | ORAL | 0 refills | Status: AC

## 2018-05-16 NOTE — Telephone Encounter (Signed)
Pt called last week stating she is leaving for vacation and feels like she has a yeast infection. Dr.Constant sent in Diflucan for pt. She called again today stating she has to go out of town again and feels like it has not cleared up. Mariel AloeLora Clark, RN said to send in one more diflucan. Rx sent. I told pt if this doesn't clear her symptoms up then she would need to come in the office for an appt. Pt expressed understanding.

## 2018-06-10 DIAGNOSIS — F4323 Adjustment disorder with mixed anxiety and depressed mood: Secondary | ICD-10-CM | POA: Diagnosis not present

## 2018-06-15 DIAGNOSIS — F4323 Adjustment disorder with mixed anxiety and depressed mood: Secondary | ICD-10-CM | POA: Diagnosis not present

## 2018-06-16 ENCOUNTER — Encounter: Payer: Self-pay | Admitting: Internal Medicine

## 2018-06-16 DIAGNOSIS — F4323 Adjustment disorder with mixed anxiety and depressed mood: Secondary | ICD-10-CM | POA: Diagnosis not present

## 2018-06-16 MED ORDER — SERTRALINE HCL 100 MG PO TABS: 100.0000 mg | ORAL_TABLET | Freq: Every day | ORAL | 5 refills | Status: DC

## 2018-06-17 DIAGNOSIS — F4323 Adjustment disorder with mixed anxiety and depressed mood: Secondary | ICD-10-CM | POA: Diagnosis not present

## 2018-06-23 ENCOUNTER — Encounter: Payer: Self-pay | Admitting: Obstetrics & Gynecology

## 2018-06-23 ENCOUNTER — Ambulatory Visit: Payer: BLUE CROSS/BLUE SHIELD | Admitting: Obstetrics & Gynecology

## 2018-06-23 ENCOUNTER — Other Ambulatory Visit: Payer: Self-pay | Admitting: Obstetrics & Gynecology

## 2018-06-23 VITALS — BP 133/89 | HR 112 | Resp 16 | Ht 62.0 in | Wt 142.0 lb

## 2018-06-23 DIAGNOSIS — Z1151 Encounter for screening for human papillomavirus (HPV): Secondary | ICD-10-CM

## 2018-06-23 DIAGNOSIS — Z01419 Encounter for gynecological examination (general) (routine) without abnormal findings: Secondary | ICD-10-CM | POA: Diagnosis not present

## 2018-06-23 DIAGNOSIS — N6311 Unspecified lump in the right breast, upper outer quadrant: Secondary | ICD-10-CM

## 2018-06-23 DIAGNOSIS — N898 Other specified noninflammatory disorders of vagina: Secondary | ICD-10-CM | POA: Diagnosis not present

## 2018-06-23 DIAGNOSIS — Z124 Encounter for screening for malignant neoplasm of cervix: Secondary | ICD-10-CM | POA: Diagnosis not present

## 2018-06-23 DIAGNOSIS — Z113 Encounter for screening for infections with a predominantly sexual mode of transmission: Secondary | ICD-10-CM

## 2018-06-24 ENCOUNTER — Ambulatory Visit
Admission: RE | Admit: 2018-06-24 | Discharge: 2018-06-24 | Disposition: A | Discharge status: Home / Self Care | Payer: BLUE CROSS/BLUE SHIELD | Source: Ambulatory Visit | Attending: Obstetrics & Gynecology | Admitting: Obstetrics & Gynecology

## 2018-06-24 ENCOUNTER — Other Ambulatory Visit: Payer: Self-pay | Admitting: *Deleted

## 2018-06-24 ENCOUNTER — Other Ambulatory Visit: Payer: Self-pay | Admitting: Obstetrics & Gynecology

## 2018-06-24 DIAGNOSIS — N6001 Solitary cyst of right breast: Secondary | ICD-10-CM | POA: Diagnosis not present

## 2018-06-24 DIAGNOSIS — R2232 Localized swelling, mass and lump, left upper limb: Secondary | ICD-10-CM

## 2018-06-24 DIAGNOSIS — N6311 Unspecified lump in the right breast, upper outer quadrant: Secondary | ICD-10-CM

## 2018-06-24 DIAGNOSIS — N6489 Other specified disorders of breast: Secondary | ICD-10-CM | POA: Diagnosis not present

## 2018-06-24 DIAGNOSIS — R922 Inconclusive mammogram: Secondary | ICD-10-CM | POA: Diagnosis not present

## 2018-06-24 LAB — HIV ANTIBODY (ROUTINE TESTING W REFLEX): HIV 1&2 Ab, 4th Generation: NONREACTIVE

## 2018-06-24 LAB — CERVICOVAGINAL ANCILLARY ONLY
BACTERIAL VAGINITIS: NEGATIVE
Candida vaginitis: POSITIVE — AB
Chlamydia: NEGATIVE
NEISSERIA GONORRHEA: NEGATIVE
TRICH (WINDOWPATH): NEGATIVE

## 2018-06-24 LAB — RPR: RPR Ser Ql: NONREACTIVE

## 2018-06-24 LAB — HEPATITIS C ANTIBODY
HEP C AB: NONREACTIVE
SIGNAL TO CUT-OFF: 0.01 (ref <1.00)

## 2018-06-24 LAB — HEPATITIS B SURFACE ANTIGEN: Hepatitis B Surface Ag: NONREACTIVE

## 2018-06-24 NOTE — Progress Notes (Signed)
Subjective:     Julia Jimenez is a 32 y.o. female here for a routine exam.  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  Infertility causing marital stress and pt requesting STD screening for husand's recent infidelity.  They are undergoing counseling with hopes to resolve.   Gynecologic History Patient's last menstrual period was 06/03/2018. Contraception: none Last Pap: 2016. Results were: normal Last mammogram: n/a.  Obstetric History OB History  Gravida Para Term Preterm AB Living  0 0 0 0 0 0  SAB TAB Ectopic Multiple Live Births  0 0 0 0       The following portions of the patient's history were reviewed and updated as appropriate: allergies, current medications, past family history, past medical history, past social history, past surgical history and problem list.  Review of Systems Pertinent items noted in HPI and remainder of comprehensive ROS otherwise negative.    Objective:      Vitals:   06/23/18 1428  BP: 133/89  Pulse: (!) 112  Resp: 16  Weight: 142 lb (64.4 kg)  Height: 5\' 2"  (1.575 m)   Vitals:  WNL General appearance: alert, cooperative and no distress  HEENT: Normocephalic, without obvious abnormality, atraumatic Eyes: negative Throat: lips, mucosa, and tongue normal; teeth and gums normal  Respiratory: Clear to auscultation bilaterally  CV: Regular rate and rhythm  Breasts:  Normal appearance, right breast increased in size than left breast--not known to patient.  Right upper outer quadrant particularly.  GI: Soft, non-tender; bowel sounds normal; no masses,  no organomegaly  GU: External Genitalia:  Tanner V, no lesion Urethra:  No prolapse   Vagina: Pink, normal rugae, no blood or discharge  Cervix: No CMT, no lesion  Uterus:  Normal size and contour, non tender  Adnexa: Normal, no masses, non tender  Musculoskeletal: No edema, redness or tenderness in the calves or thighs  Skin: No lesions or  rash  Lymphatic: Axillary adenopathy: none     Psychiatric: Normal mood and behavior   Assessment:    Healthy female exam.   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 Treat based on results Diagnostic mammogram right breast.

## 2018-06-26 ENCOUNTER — Other Ambulatory Visit: Payer: Self-pay | Admitting: Obstetrics & Gynecology

## 2018-06-26 MED ORDER — TERCONAZOLE 0.8 % VA CREA: 1.0000 | TOPICAL_CREAM | Freq: Every day | VAGINAL | 0 refills | Status: DC

## 2018-06-26 NOTE — Progress Notes (Signed)
Pt has taken Diflucan x1 without relief.  Terazol 3 prescribed.

## 2018-06-27 LAB — CYTOLOGY - PAP
DIAGNOSIS: NEGATIVE
HPV (WINDOPATH): NOT DETECTED

## 2018-06-29 ENCOUNTER — Encounter: Payer: Self-pay | Admitting: Internal Medicine

## 2018-06-30 DIAGNOSIS — F4323 Adjustment disorder with mixed anxiety and depressed mood: Secondary | ICD-10-CM | POA: Diagnosis not present

## 2018-06-30 MED ORDER — ALPRAZOLAM 0.5 MG PO TABS: 0.2500 mg | ORAL_TABLET | Freq: Every evening | ORAL | 0 refills | Status: DC | PRN

## 2018-06-30 MED ORDER — SERTRALINE HCL 100 MG PO TABS: 150.0000 mg | ORAL_TABLET | Freq: Every day | ORAL | 5 refills | Status: DC

## 2018-07-07 DIAGNOSIS — F4323 Adjustment disorder with mixed anxiety and depressed mood: Secondary | ICD-10-CM | POA: Diagnosis not present

## 2018-07-22 DIAGNOSIS — H52223 Regular astigmatism, bilateral: Secondary | ICD-10-CM | POA: Diagnosis not present

## 2018-07-22 DIAGNOSIS — H5213 Myopia, bilateral: Secondary | ICD-10-CM | POA: Diagnosis not present

## 2018-07-28 DIAGNOSIS — F4323 Adjustment disorder with mixed anxiety and depressed mood: Secondary | ICD-10-CM | POA: Diagnosis not present

## 2018-08-04 DIAGNOSIS — F4323 Adjustment disorder with mixed anxiety and depressed mood: Secondary | ICD-10-CM | POA: Diagnosis not present

## 2018-08-11 DIAGNOSIS — F4323 Adjustment disorder with mixed anxiety and depressed mood: Secondary | ICD-10-CM | POA: Diagnosis not present

## 2018-08-19 DIAGNOSIS — F4323 Adjustment disorder with mixed anxiety and depressed mood: Secondary | ICD-10-CM | POA: Diagnosis not present

## 2018-09-09 ENCOUNTER — Other Ambulatory Visit: Payer: Self-pay | Admitting: Obstetrics & Gynecology

## 2018-09-09 ENCOUNTER — Ambulatory Visit: Payer: BLUE CROSS/BLUE SHIELD | Admitting: Internal Medicine

## 2018-09-09 ENCOUNTER — Encounter: Payer: Self-pay | Admitting: Internal Medicine

## 2018-09-09 DIAGNOSIS — G479 Sleep disorder, unspecified: Secondary | ICD-10-CM

## 2018-09-09 DIAGNOSIS — F419 Anxiety disorder, unspecified: Secondary | ICD-10-CM

## 2018-09-09 DIAGNOSIS — Z30011 Encounter for initial prescription of contraceptive pills: Secondary | ICD-10-CM | POA: Diagnosis not present

## 2018-09-09 DIAGNOSIS — F3289 Other specified depressive episodes: Secondary | ICD-10-CM | POA: Diagnosis not present

## 2018-09-09 MED ORDER — DROSPIRENONE-ETHINYL ESTRADIOL 3-0.03 MG PO TABS: 1.0000 | ORAL_TABLET | Freq: Every day | ORAL | 11 refills | Status: DC

## 2018-09-09 MED ORDER — SERTRALINE HCL 100 MG PO TABS: 100.0000 mg | ORAL_TABLET | Freq: Every day | ORAL | 5 refills | Status: DC

## 2018-09-09 MED ORDER — TRAZODONE HCL 50 MG PO TABS: 25.0000 mg | ORAL_TABLET | Freq: Every evening | ORAL | 3 refills | Status: DC | PRN

## 2018-09-09 MED ORDER — FLUCONAZOLE 150 MG PO TABS: ORAL_TABLET | ORAL | 1 refills | Status: DC

## 2018-09-09 NOTE — Progress Notes (Signed)
Pt having symptoms of yeast infection.  Will send in Rx for Diflucan.  If not better, then patient to make appointment at the office.

## 2018-09-09 NOTE — Patient Instructions (Signed)
  Medications reviewed and updated.  Changes include :   Start trazodone at night.  Start birth control.   Your prescription(s) have been submitted to your pharmacy. Please take as directed and contact our office if you believe you are having problem(s) with the medication(s).   Please followup in 6 months

## 2018-09-09 NOTE — Progress Notes (Signed)
Subjective:    Patient ID: Julia Jimenez, female    DOB: 27-Apr-1986, 32 y.o.   MRN: [id number]  HPI The patient is here for follow up.  Depression, anxiety:  She is taking 100 mg of the sertraline.  She did take the 150 mg for short period, but then decreased back to 100 mg.  She takes xanax on a rare occasion.  She feels her current dose is good.  She still has some anxiety and depression, but overall feels she is handling it well.  She is seeing a therapist, which is helping.  Sleep difficulties: Her biggest concern is her sleep difficulties.  She often wakes up during the night and is not able to get back to sleep.  She knows this is related to everything that has happened.  When she wakes up she starts thinking about everything that happens and is unable to sleep.  As a result she is screaming fatigue during the day.  Wants to get back on birth control.  She is currently dating someone and if something wants to happen she wants to be prepared.  Medications and allergies reviewed with patient and updated if appropriate.  Patient Active Problem List   Diagnosis Date Noted  . Cervical lymphadenopathy 09/24/2017  . Family history of diabetes mellitus 09/24/2017  . Sprain of left ankle 08/13/2017  . Skin lumps 08/13/2017  . 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 Back pain 06/28/2017  . Vaginal yeast infection 06/28/2017  . Bursitis of shoulder, left 05/04/2017  . Infectious mononucleosis 09/09/2016  . Fatigue 08/25/2016  . Lightheadedness 08/25/2016  . Sinus congestion 09/22/2015  . Anxiety 09/22/2015  . 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 ALPRAZolam (XANAX) 0.5 MG tablet Take 0.5-1 tablets (0.25-0.5 mg total) by mouth at bedtime as needed for anxiety. 30 tablet 0  . Prenatal Vit-Fe Fumarate-FA (PRENATAL VITAMIN PO) Take 1 tablet by mouth daily.    . sertraline (ZOLOFT) 100 MG  tablet Take 1.5 tablets (150 mg total) by mouth daily. 45 tablet 5  . terconazole (TERAZOL 3) 0.8 % vaginal cream Place 1 applicator vaginally at bedtime. 20 g 0   No current facility-administered medications on file prior to visit.     Past Medical History:  Diagnosis Date  . Allergic rhinitis   . Anemia    in high school  . Anxiety   . Elevated blood pressure reading without diagnosis of hypertension    in context of Adderall initiation  . History of chicken pox   . Infertility, female   . Ovarian cyst, right 2014    Past Surgical History:  Procedure Laterality Date  . TONSILLECTOMY AND ADENOIDECTOMY    . WISDOM TOOTH EXTRACTION      Social History   Socioeconomic History  . Marital status: Married    Spouse name: Not on file  . Number of children: Not on file  . Years of education: Not on file  . Highest education level: Not on file  Occupational History  . Occupation: Education officer, community  Social Needs  . Financial resource strain: Not on file  . Food insecurity:    Worry: Not on file    Inability: Not on file  . Transportation needs:    Medical: Not on file    Non-medical: Not on file  Tobacco Use  . Smoking status: Never Smoker  . Smokeless tobacco: Never Used  Substance and Sexual Activity  . Alcohol use: Yes    Alcohol/week: 2.0 standard drinks    Types: 2 Standard drinks or equivalent per week    Comment: occasionally  . Drug use: No  . Sexual activity: Yes    Partners: Male    Birth control/protection: None    Comment: Mirena IUD inserted 06-21-13  Lifestyle  . Physical activity:    Days per week: Not on file    Minutes per session: Not on file  . Stress: Not on file  Relationships  . Social connections:    Talks on phone: Not on file    Gets together: Not on file    Attends religious service: Not on file    Active member of club or organization: Not on file    Attends meetings of clubs or organizations: Not on file    Relationship status: Not on file    Other Topics Concern  . Not on file  Social History Narrative   Married    2 people living in household   Employed    Highest education level DDS   Occupation Dentist   7-8 hours of sleep a night     Family History  Problem Relation Age of Onset  . Hypertension Mother   . Depression Mother   . Anxiety disorder Mother   . Diabetes Father   . Hypertension Brother   . Depression Brother   . Diabetes Maternal Aunt   . Stroke Maternal Uncle   . Diabetes Maternal Uncle        x2  . Diabetes Paternal Grandmother   . Heart attack Paternal Grandmother   . Cancer Maternal Grandfather        Dec CA of unknown origin  . Hepatitis C Maternal Grandmother        Dec    Review of Systems  Constitutional: Negative for appetite change.  Respiratory: Negative for shortness of breath.   Cardiovascular: Negative for chest pain and palpitations.  Neurological: Positive for headaches (occ, chronic).  Psychiatric/Behavioral: Positive for sleep disturbance.       Objective:   Vitals:   09/09/18 1416  BP: 126/72  Pulse: 78  Resp: 16  Temp: 98.3 F (36.8 C)  SpO2: 98%   BP Readings from Last 3 Encounters:  09/09/18 126/72  06/23/18 133/89  09/24/17 122/84   Wt Readings from Last 3 Encounters:  09/09/18 143 lb (64.9 kg)  06/23/18 142 lb (64.4 kg)  09/24/17 153 lb (69.4 kg)   Body mass index is 26.16 kg/m.   Physical Exam    Constitutional: Appears well-developed and well-nourished. No distress.  HENT:  Head: Normocephalic and atraumatic.  Neck: Neck supple. No tracheal deviation present. No thyromegaly present.  No cervical lymphadenopathy Cardiovascular: Normal rate, regular rhythm and normal heart sounds.   No murmur heard. No carotid bruit .  No edema Pulmonary/Chest: Effort normal and breath sounds normal. No respiratory distress. No has no wheezes. No rales.  Skin: Skin is warm and dry. Not diaphoretic.  Psychiatric: Normal mood and affect. Behavior is normal.       Assessment & Plan:    See Problem List for Assessment and Plan of chronic medical problems.

## 2018-09-10 DIAGNOSIS — Z309 Encounter for contraceptive management, unspecified: Secondary | ICD-10-CM | POA: Insufficient documentation

## 2018-09-10 DIAGNOSIS — F32A Depression, unspecified: Secondary | ICD-10-CM | POA: Insufficient documentation

## 2018-09-10 DIAGNOSIS — G479 Sleep disorder, unspecified: Secondary | ICD-10-CM | POA: Insufficient documentation

## 2018-09-10 DIAGNOSIS — F329 Major depressive disorder, single episode, unspecified: Secondary | ICD-10-CM | POA: Insufficient documentation

## 2018-09-10 NOTE — Assessment & Plan Note (Signed)
Difficulty sleeping related to anxiety and depression secondary to separation from her husband Likely transient Will start trazodone 50 mg at bedtime-she will let me know if this is effective Continue sertraline 100 mg daily She is seeing a therapist Good sleep hygiene

## 2018-09-10 NOTE — Assessment & Plan Note (Signed)
Overall anxiety is fairly controlled Continue sertraline 100 mg daily Can take Xanax, but will only take as needed-currently not taking often She is seeing a therapist

## 2018-09-10 NOTE — Assessment & Plan Note (Signed)
She wants to restart birth control-she has been on he has been in the past and did well with that Discussed other options, but we both feel the pill would be best at this time She does follow with GYN, but saw them a couple of months ago Start yasmin

## 2018-09-10 NOTE — Assessment & Plan Note (Signed)
Controlled, stable Continue current dose of medication - sertraline 100 mg daily Seeing a therapist

## 2018-09-16 DIAGNOSIS — F4323 Adjustment disorder with mixed anxiety and depressed mood: Secondary | ICD-10-CM | POA: Diagnosis not present

## 2018-09-19 ENCOUNTER — Ambulatory Visit: Payer: BLUE CROSS/BLUE SHIELD | Admitting: Family

## 2018-09-19 ENCOUNTER — Encounter: Payer: Self-pay | Admitting: Family

## 2018-09-19 VITALS — BP 128/78 | HR 99 | Temp 98.8°F | Ht 62.0 in | Wt 145.0 lb

## 2018-09-19 DIAGNOSIS — J029 Acute pharyngitis, unspecified: Secondary | ICD-10-CM | POA: Diagnosis not present

## 2018-09-19 DIAGNOSIS — B379 Candidiasis, unspecified: Secondary | ICD-10-CM | POA: Diagnosis not present

## 2018-09-19 DIAGNOSIS — J019 Acute sinusitis, unspecified: Secondary | ICD-10-CM

## 2018-09-19 LAB — POCT GLYCOSYLATED HEMOGLOBIN (HGB A1C): HEMOGLOBIN A1C: 5.1 % (ref 4.0–5.6)

## 2018-09-19 LAB — POCT RAPID STREP A (OFFICE): Rapid Strep A Screen: NEGATIVE

## 2018-09-19 MED ORDER — HYDROCOD POLST-CPM POLST ER 10-8 MG/5ML PO SUER: 5.0000 mL | Freq: Every evening | ORAL | 0 refills | Status: DC | PRN

## 2018-09-19 MED ORDER — CEFDINIR 300 MG PO CAPS: 300.0000 mg | ORAL_CAPSULE | Freq: Two times a day (BID) | ORAL | 0 refills | Status: DC

## 2018-09-19 MED ORDER — FLUCONAZOLE 150 MG PO TABS: 150.0000 mg | ORAL_TABLET | Freq: Once | ORAL | 0 refills | Status: AC

## 2018-09-19 NOTE — Addendum Note (Signed)
Addended by: Karma Ganja on: 09/19/2018 02:55 PM   Modules accepted: Orders

## 2018-09-19 NOTE — Progress Notes (Signed)
Julia Jimenez is a 32 y.o. female with the following history as recorded in [facility]:  Patient Active Problem List   Diagnosis Date Noted  . Depression 09/10/2018  . Sleep difficulties 09/10/2018  . Encounter for birth control 09/10/2018  . Cervical lymphadenopathy 09/24/2017  . Family history of diabetes mellitus 09/24/2017  . Skin lumps 08/13/2017  . 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 Vaginal yeast infection 06/28/2017  . Bursitis of shoulder, left 05/04/2017  . Fatigue 08/25/2016  . Anxiety 09/22/2015  . 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 ALPRAZolam (XANAX) 0.5 MG tablet Take 0.5-1 tablets (0.25-0.5 mg total) by mouth at bedtime as needed for anxiety. 30 tablet 0  . drospirenone-ethinyl estradiol (YASMIN,ZARAH,SYEDA) 3-0.03 MG tablet Take 1 tablet by mouth daily. 1 Package 11  . Prenatal Vit-Fe Fumarate-FA (PRENATAL VITAMIN PO) Take 1 tablet by mouth daily.    . sertraline (ZOLOFT) 100 MG tablet Take 1 tablet (100 mg total) by mouth daily. 90 tablet 5  . terconazole (TERAZOL 3) 0.8 % vaginal cream Place 1 applicator vaginally at bedtime. 20 g 0  . traZODone (DESYREL) 50 MG tablet Take 0.5-1 tablets (25-50 mg total) by mouth at bedtime as needed for sleep. 30 tablet 3  . cefdinir (OMNICEF) 300 MG capsule Take 1 capsule (300 mg total) by mouth 2 (two) times daily. 20 capsule 0  . chlorpheniramine-HYDROcodone (TUSSIONEX PENNKINETIC ER) 10-8 MG/5ML SUER Take 5 mLs by mouth at bedtime as needed for cough. 75 mL 0  . fluconazole (DIFLUCAN) 150 MG tablet Take 1 tablet (150 mg total) by mouth once for 1 dose. Repeat after 72 hours 2 tablet 0   No current facility-administered medications for this visit.     Allergies: Patient has no known allergies.  Past Medical History:  Diagnosis Date  . Allergic rhinitis   . Anemia    in high school  . Anxiety   . Elevated blood pressure reading without diagnosis  of hypertension    in context of Adderall initiation  . History of chicken pox   . Infertility, female   . Ovarian cyst, right 2014    Past Surgical History:  Procedure Laterality Date  . TONSILLECTOMY AND ADENOIDECTOMY    . WISDOM TOOTH EXTRACTION      Family History  Problem Relation Age of Onset  . Hypertension Mother   . Depression Mother   . Anxiety disorder Mother   . Diabetes Father   . Hypertension Brother   . Depression Brother   . Diabetes Maternal Aunt   . Stroke Maternal Uncle   . Diabetes Maternal Uncle        x2  . Diabetes Paternal Grandmother   . Heart attack Paternal Grandmother   . Cancer Maternal Grandfather        Dec CA of unknown origin  . Hepatitis C Maternal Grandmother        Dec    Social History   Tobacco Use  . Smoking status: Never Smoker  . Smokeless tobacco: Never Used  Substance Use Topics  . Alcohol use: Yes    Alcohol/week: 2.0 standard drinks    Types: 2 Standard drinks or equivalent per week    Comment: occasionally    Subjective:  Sore throat/ cough/ congestion x 1 week; no fever; using OTC Dayquil/ Nyquil; + productive cough; + sinus pain/ pressure;   LMP- today;     Objective:  Vitals:  09/19/18 1421  BP: 128/78  Pulse: 99  Temp: 98.8 F (37.1 C)  TempSrc: Oral  SpO2: 99%  Weight: 145 lb 0.6 oz (65.8 kg)  Height: 5\' 2"  (1.575 m)    General: Well developed, well nourished, in no acute distress  Skin : Warm and dry.  Head: Normocephalic and atraumatic  Eyes: Sclera and conjunctiva clear; pupils round and reactive to light; extraocular movements intact  Ears: External normal; canals clear; tympanic membranes normal  Oropharynx: Pink, supple. No suspicious lesions  Neck: Supple without thyromegaly, adenopathy  Lungs: Respirations unlabored; clear to auscultation bilaterally without wheeze, rales, rhonchi  CVS exam: normal rate and regular rhythm.  Neurologic: Alert and oriented; speech intact; face symmetrical;  moves all extremities well; CNII-XII intact without focal deficit   Assessment:  1. Sore throat   2. Acute sinusitis, recurrence not specified, unspecified location     Plan:  Rapid strep is negative; Rx for Omnicef 300 mg bid x 10 days, Tussionex to use at night; increase fluids,rest and follow-up worse, no better. RX sent for Diflucan in case she develops a yeast infection.   No follow-ups on file.  Orders Placed This Encounter  Procedures  . POCT rapid strep A    Requested Prescriptions   Signed Prescriptions Disp Refills  . fluconazole (DIFLUCAN) 150 MG tablet 2 tablet 0    Sig: Take 1 tablet (150 mg total) by mouth once for 1 dose. Repeat after 72 hours  . chlorpheniramine-HYDROcodone (TUSSIONEX PENNKINETIC ER) 10-8 MG/5ML SUER 75 mL 0    Sig: Take 5 mLs by mouth at bedtime as needed for cough.  . cefdinir (OMNICEF) 300 MG capsule 20 capsule 0    Sig: Take 1 capsule (300 mg total) by mouth 2 (two) times daily.

## 2018-09-20 ENCOUNTER — Ambulatory Visit: Payer: BLUE CROSS/BLUE SHIELD | Admitting: Internal Medicine

## 2018-09-30 DIAGNOSIS — F4323 Adjustment disorder with mixed anxiety and depressed mood: Secondary | ICD-10-CM | POA: Diagnosis not present

## 2018-10-13 DIAGNOSIS — F4323 Adjustment disorder with mixed anxiety and depressed mood: Secondary | ICD-10-CM | POA: Diagnosis not present

## 2018-10-27 DIAGNOSIS — F4323 Adjustment disorder with mixed anxiety and depressed mood: Secondary | ICD-10-CM | POA: Diagnosis not present

## 2018-11-03 ENCOUNTER — Encounter: Payer: Self-pay | Admitting: Internal Medicine

## 2018-11-09 NOTE — Progress Notes (Signed)
Subjective:    Patient ID: Julia Jimenez, female    DOB: July 02, 1986, 33 y.o.   MRN: [id number]  HPI She is here for an acute visit for cold symptoms.  Her symptoms started weeks ago.  She had worse symptoms a couple of months ago and did take an antibiotic at that time.  She does not think the antibiotic did much.  She has had persistent drainage since then and a cough from the drainage.  She has intermittent sinus pressure and headaches.    She has taken over the counter meds w/o improvement    Medications and allergies reviewed with patient and updated if appropriate.  Patient Active Problem List   Diagnosis Date Noted  . Depression 09/10/2018  . Sleep difficulties 09/10/2018  . Encounter for birth control 09/10/2018  . Cervical lymphadenopathy 09/24/2017  . Family history of diabetes mellitus 09/24/2017  . Skin lumps 08/13/2017  . 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 Vaginal yeast infection 06/28/2017  . Bursitis of shoulder, left 05/04/2017  . Fatigue 08/25/2016  . Anxiety 09/22/2015  . 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 ALPRAZolam (XANAX) 0.5 MG tablet Take 0.5-1 tablets (0.25-0.5 mg total) by mouth at bedtime as needed for anxiety. 30 tablet 0  . cefdinir (OMNICEF) 300 MG capsule Take 1 capsule (300 mg total) by mouth 2 (two) times daily. 20 capsule 0  . chlorpheniramine-HYDROcodone (TUSSIONEX PENNKINETIC ER) 10-8 MG/5ML SUER Take 5 mLs by mouth at bedtime as needed for cough. 75 mL 0  . drospirenone-ethinyl estradiol (YASMIN,ZARAH,SYEDA) 3-0.03 MG tablet Take 1 tablet by mouth daily. 1 Package 11  . Prenatal Vit-Fe Fumarate-FA (PRENATAL VITAMIN PO) Take 1 tablet by mouth daily.    . sertraline (ZOLOFT) 100 MG tablet Take 1 tablet (100 mg total) by mouth daily. 90 tablet 5  . terconazole (TERAZOL 3) 0.8 % vaginal cream Place 1 applicator vaginally at bedtime. 20 g 0   . traZODone (DESYREL) 50 MG tablet Take 0.5-1 tablets (25-50 mg total) by mouth at bedtime as needed for sleep. 30 tablet 3   No current facility-administered medications on file prior to visit.     Past Medical History:  Diagnosis Date  . Allergic rhinitis   . Anemia    in high school  . Anxiety   . Elevated blood pressure reading without diagnosis of hypertension    in context of Adderall initiation  . History of chicken pox   . Infertility, female   . Ovarian cyst, right 2014    Past Surgical History:  Procedure Laterality Date  . TONSILLECTOMY AND ADENOIDECTOMY    . WISDOM TOOTH EXTRACTION      Social History   Socioeconomic History  . Marital status: Married    Spouse name: Not on file  . Number of children: Not on file  . Years of education: Not on file  . Highest education level: Not on file  Occupational History  . Occupation: Education officer, community  Social Needs  . Financial resource strain: Not on file  . Food insecurity:    Worry: Not on file    Inability: Not on file  . Transportation needs:    Medical: Not on file    Non-medical: Not on file  Tobacco Use  . Smoking status: Never Smoker  . Smokeless tobacco: Never Used  Substance and Sexual Activity  . Alcohol use: Yes    Alcohol/week:  2.0 standard drinks    Types: 2 Standard drinks or equivalent per week    Comment: occasionally  . Drug use: No  . Sexual activity: Yes    Partners: Male    Birth control/protection: None    Comment: Mirena IUD inserted 06-21-13  Lifestyle  . Physical activity:    Days per week: Not on file    Minutes per session: Not on file  . Stress: Not on file  Relationships  . Social connections:    Talks on phone: Not on file    Gets together: Not on file    Attends religious service: Not on file    Active member of club or organization: Not on file    Attends meetings of clubs or organizations: Not on file    Relationship status: Not on file  Other Topics Concern  . Not on file   Social History Narrative   Married    2 people living in household   Employed    Highest education level DDS   Occupation Dentist   7-8 hours of sleep a night     Family History  Problem Relation Age of Onset  . Hypertension Mother   . Depression Mother   . Anxiety disorder Mother   . Diabetes Father   . Hypertension Brother   . Depression Brother   . Diabetes Maternal Aunt   . Stroke Maternal Uncle   . Diabetes Maternal Uncle        x2  . Diabetes Paternal Grandmother   . Heart attack Paternal Grandmother   . Cancer Maternal Grandfather        Dec CA of unknown origin  . Hepatitis C Maternal Grandmother        Dec    Review of Systems  Constitutional: Negative for chills and fever.  HENT: Positive for postnasal drip and sinus pressure. Negative for congestion, ear pain and sore throat.   Respiratory: Positive for cough (from PND). Negative for shortness of breath and wheezing.   Neurological: Negative for headaches.       Objective:   Vitals:   11/10/18 0934  BP: 130/82  Pulse: 70  Resp: 16  Temp: 98.7 F (37.1 C)  SpO2: 99%   Filed Weights   11/10/18 0934  Weight: 142 lb (64.4 kg)   Body mass index is 25.97 kg/m.  Wt Readings from Last 3 Encounters:  11/10/18 142 lb (64.4 kg)  09/19/18 145 lb 0.6 oz (65.8 kg)  09/09/18 143 lb (64.9 kg)     Physical Exam GENERAL APPEARANCE: Appears stated age, well appearing, NAD EYES: conjunctiva clear, no icterus HEENT: bilateral tympanic membranes and ear canals normal, oropharynx with no erythema, no thyromegaly, trachea midline, no cervical or supraclavicular lymphadenopathy LUNGS: Clear to auscultation without wheeze or crackles, unlabored breathing, good air entry bilaterally CARDIOVASCULAR: Normal S1,S2 without murmurs, no edema SKIN: warm, dry        Assessment & Plan:   See Problem List for Assessment and Plan of chronic medical problems.

## 2018-11-10 ENCOUNTER — Ambulatory Visit: Payer: BLUE CROSS/BLUE SHIELD | Admitting: Internal Medicine

## 2018-11-10 ENCOUNTER — Encounter: Payer: Self-pay | Admitting: Internal Medicine

## 2018-11-10 VITALS — BP 130/82 | HR 70 | Temp 98.7°F | Resp 16 | Ht 62.0 in | Wt 142.0 lb

## 2018-11-10 DIAGNOSIS — J320 Chronic maxillary sinusitis: Secondary | ICD-10-CM

## 2018-11-10 MED ORDER — FLUCONAZOLE 150 MG PO TABS: 150.0000 mg | ORAL_TABLET | Freq: Once | ORAL | 0 refills | Status: AC

## 2018-11-10 MED ORDER — AZITHROMYCIN 250 MG PO TABS: ORAL_TABLET | ORAL | 0 refills | Status: DC

## 2018-11-10 NOTE — Assessment & Plan Note (Signed)
History concerning for chronic sinus infection Will try an ABX - zpak otc cold meds as needed Rest, fluids  Call if no improvement

## 2018-11-10 NOTE — Patient Instructions (Signed)
Take the antibiotic and diflucan if needed.    Sinusitis, Adult Sinusitis is inflammation of your sinuses. Sinuses are hollow spaces in the bones around your face. Your sinuses are located:  Around your eyes.  In the middle of your forehead.  Behind your nose.  In your cheekbones. Mucus normally drains out of your sinuses. When your nasal tissues become inflamed or swollen, mucus can become trapped or blocked. This allows bacteria, viruses, and fungi to grow, which leads to infection. Most infections of the sinuses are caused by a virus. Sinusitis can develop quickly. It can last for up to 4 weeks (acute) or for more than 12 weeks (chronic). Sinusitis often develops after a cold. What are the causes? This condition is caused by anything that creates swelling in the sinuses or stops mucus from draining. This includes:  Allergies.  Asthma.  Infection from bacteria or viruses.  Deformities or blockages in your nose or sinuses.  Abnormal growths in the nose (nasal polyps).  Pollutants, such as chemicals or irritants in the air.  Infection from fungi (rare). What increases the risk? You are more likely to develop this condition if you:  Have a weak body defense system (immune system).  Do a lot of swimming or diving.  Overuse nasal sprays.  Smoke. What are the signs or symptoms? The main symptoms of this condition are pain and a feeling of pressure around the affected sinuses. Other symptoms include:  Stuffy nose or congestion.  Thick drainage from your nose.  Swelling and warmth over the affected sinuses.  Headache.  Upper toothache.  A cough that may get worse at night.  Extra mucus that collects in the throat or the back of the nose (postnasal drip).  Decreased sense of smell and taste.  Fatigue.  A fever.  Sore throat.  Bad breath. How is this diagnosed? This condition is diagnosed based on:  Your symptoms.  Your medical history.  A physical  exam.  Tests to find out if your condition is acute or chronic. This may include: ? Checking your nose for nasal polyps. ? Viewing your sinuses using a device that has a light (endoscope). ? Testing for allergies or bacteria. ? Imaging tests, such as an MRI or CT scan. In rare cases, a bone biopsy may be done to rule out more serious types of fungal sinus disease. How is this treated? Treatment for sinusitis depends on the cause and whether your condition is chronic or acute.  If caused by a virus, your symptoms should go away on their own within 10 days. You may be given medicines to relieve symptoms. They include: ? Medicines that shrink swollen nasal passages (topical intranasal decongestants). ? Medicines that treat allergies (antihistamines). ? A spray that eases inflammation of the nostrils (topical intranasal corticosteroids). ? Rinses that help get rid of thick mucus in your nose (nasal saline washes).  If caused by bacteria, your health care provider may recommend waiting to see if your symptoms improve. Most bacterial infections will get better without antibiotic medicine. You may be given antibiotics if you have: ? A severe infection. ? A weak immune system.  If caused by narrow nasal passages or nasal polyps, you may need to have surgery. Follow these instructions at home: Medicines  Take, use, or apply over-the-counter and prescription medicines only as told by your health care provider. These may include nasal sprays.  If you were prescribed an antibiotic medicine, take it as told by your health care provider.  Do not stop taking the antibiotic even if you start to feel better. Hydrate and humidify   Drink enough fluid to keep your urine pale yellow. Staying hydrated will help to thin your mucus.  Use a cool mist humidifier to keep the humidity level in your home above 50%.  Inhale steam for 10-15 minutes, 3-4 times a day, or as told by your health care provider. You  can do this in the bathroom while a hot shower is running.  Limit your exposure to cool or dry air. Rest  Rest as much as possible.  Sleep with your head raised (elevated).  Make sure you get enough sleep each night. General instructions   Apply a warm, moist washcloth to your face 3-4 times a day or as told by your health care provider. This will help with discomfort.  Wash your hands often with soap and water to reduce your exposure to germs. If soap and water are not available, use hand sanitizer.  Do not smoke. Avoid being around people who are smoking (secondhand smoke).  Keep all follow-up visits as told by your health care provider. This is important. Contact a health care provider if:  You have a fever.  Your symptoms get worse.  Your symptoms do not improve within 10 days. Get help right away if:  You have a severe headache.  You have persistent vomiting.  You have severe pain or swelling around your face or eyes.  You have vision problems.  You develop confusion.  Your neck is stiff.  You have trouble breathing. Summary  Sinusitis is soreness and inflammation of your sinuses. Sinuses are hollow spaces in the bones around your face.  This condition is caused by nasal tissues that become inflamed or swollen. The swelling traps or blocks the flow of mucus. This allows bacteria, viruses, and fungi to grow, which leads to infection.  If you were prescribed an antibiotic medicine, take it as told by your health care provider. Do not stop taking the antibiotic even if you start to feel better.  Keep all follow-up visits as told by your health care provider. This is important. This information is not intended to replace advice given to you by your health care provider. Make sure you discuss any questions you have with your health care provider. Document Released: 10/26/2005 Document Revised: 03/28/2018 Document Reviewed: 03/28/2018 Elsevier Interactive Patient  Education  2019 [employer].

## 2018-11-24 DIAGNOSIS — F4323 Adjustment disorder with mixed anxiety and depressed mood: Secondary | ICD-10-CM | POA: Diagnosis not present

## 2018-12-09 DIAGNOSIS — F4323 Adjustment disorder with mixed anxiety and depressed mood: Secondary | ICD-10-CM | POA: Diagnosis not present

## 2018-12-23 DIAGNOSIS — F4323 Adjustment disorder with mixed anxiety and depressed mood: Secondary | ICD-10-CM | POA: Diagnosis not present

## 2019-01-18 ENCOUNTER — Other Ambulatory Visit: Payer: Self-pay | Admitting: Internal Medicine

## 2019-01-31 DIAGNOSIS — D2262 Melanocytic nevi of left upper limb, including shoulder: Secondary | ICD-10-CM | POA: Diagnosis not present

## 2019-01-31 DIAGNOSIS — D225 Melanocytic nevi of trunk: Secondary | ICD-10-CM | POA: Diagnosis not present

## 2019-01-31 DIAGNOSIS — D2261 Melanocytic nevi of right upper limb, including shoulder: Secondary | ICD-10-CM | POA: Diagnosis not present

## 2019-01-31 DIAGNOSIS — D224 Melanocytic nevi of scalp and neck: Secondary | ICD-10-CM | POA: Diagnosis not present

## 2019-02-07 ENCOUNTER — Telehealth: Payer: Self-pay

## 2019-02-07 DIAGNOSIS — B379 Candidiasis, unspecified: Secondary | ICD-10-CM

## 2019-02-07 MED ORDER — FLUCONAZOLE 150 MG PO TABS: 150.0000 mg | ORAL_TABLET | Freq: Once | ORAL | 1 refills | Status: AC

## 2019-02-07 NOTE — Telephone Encounter (Signed)
PT called stating she is experiencing vaginal itching and discharge. Due to COVID-19 we are trying to not bring patients into the office. Diflucan sent. Pt is aware

## 2019-02-14 ENCOUNTER — Other Ambulatory Visit: Payer: Self-pay

## 2019-02-14 ENCOUNTER — Other Ambulatory Visit: Payer: Self-pay | Admitting: Obstetrics & Gynecology

## 2019-02-14 ENCOUNTER — Telehealth: Payer: Self-pay | Admitting: *Deleted

## 2019-02-14 ENCOUNTER — Other Ambulatory Visit (INDEPENDENT_AMBULATORY_CARE_PROVIDER_SITE_OTHER): Payer: BLUE CROSS/BLUE SHIELD

## 2019-02-14 DIAGNOSIS — Z113 Encounter for screening for infections with a predominantly sexual mode of transmission: Secondary | ICD-10-CM

## 2019-02-14 DIAGNOSIS — N898 Other specified noninflammatory disorders of vagina: Secondary | ICD-10-CM

## 2019-02-14 DIAGNOSIS — N760 Acute vaginitis: Secondary | ICD-10-CM

## 2019-02-14 MED ORDER — TERCONAZOLE 0.4 % VA CREA: 1.0000 | TOPICAL_CREAM | Freq: Every day | VAGINAL | 0 refills | Status: DC

## 2019-02-14 MED ORDER — FLUCONAZOLE 150 MG PO TABS: ORAL_TABLET | ORAL | 0 refills | Status: DC

## 2019-02-14 NOTE — Telephone Encounter (Signed)
Pt called stating that she took the Diflucan that was called into her pharmacy last week but doesn't feel like the infection is gone.  Will try Terazol 7 vaginal cream this time, If not any improvement she may have to come in for further testing.

## 2019-02-14 NOTE — Progress Notes (Signed)
Pt here for self swab per Dr Penne Lash.  Swab sent STAT

## 2019-02-14 NOTE — Progress Notes (Signed)
Patient ID: Julia Jimenez, female   DOB: Sep 16, 1986, 33 y.o.   MRN: [id number]   Pt c/o yeast symptoms.  Will treat with Diflcuan for now and aptima (full panel) will be sent today.  Pt will come to office today at 1 pm and self swab.

## 2019-02-15 LAB — CERVICOVAGINAL ANCILLARY ONLY
Bacterial vaginitis: NEGATIVE
Candida vaginitis: NEGATIVE
Chlamydia: NEGATIVE
Neisseria Gonorrhea: NEGATIVE
Trichomonas: NEGATIVE

## 2019-02-20 ENCOUNTER — Ambulatory Visit: Payer: BLUE CROSS/BLUE SHIELD | Admitting: Obstetrics & Gynecology

## 2019-02-20 ENCOUNTER — Other Ambulatory Visit: Payer: Self-pay

## 2019-02-20 ENCOUNTER — Encounter: Payer: Self-pay | Admitting: Obstetrics & Gynecology

## 2019-02-20 VITALS — BP 111/77 | HR 82 | Temp 97.7°F | Resp 16 | Ht 62.0 in | Wt 144.0 lb

## 2019-02-20 DIAGNOSIS — Z30430 Encounter for insertion of intrauterine contraceptive device: Secondary | ICD-10-CM

## 2019-02-20 DIAGNOSIS — Z3202 Encounter for pregnancy test, result negative: Secondary | ICD-10-CM

## 2019-02-20 DIAGNOSIS — Z113 Encounter for screening for infections with a predominantly sexual mode of transmission: Secondary | ICD-10-CM

## 2019-02-20 DIAGNOSIS — N898 Other specified noninflammatory disorders of vagina: Secondary | ICD-10-CM | POA: Diagnosis not present

## 2019-02-20 LAB — POCT URINE PREGNANCY: Preg Test, Ur: NEGATIVE

## 2019-02-20 MED ORDER — LEVONORGESTREL 19.5 MCG/DAY IU IUD
INTRAUTERINE_SYSTEM | Freq: Once | INTRAUTERINE | Status: AC
  Administered 2019-02-20: 10:00:00 via INTRAUTERINE

## 2019-02-20 NOTE — Progress Notes (Signed)
   Subjective:    Patient ID: Julia Jimenez, female    DOB: August 24, 1986, 33 y.o.   MRN: [id number]  HPI  Pt has been having days of vaginal burning, itching, and white thick discharge.  Pt did not get relief with Diflucan.  She started terazol 7 is feeling much better over the weekend.  She presents for exam and also would like an IUD>  She is on the placebo portion of yasmin.  GC/Chalm negative last week.  Review of Systems  Constitutional: Negative.   Respiratory: Negative.   Cardiovascular: Negative.   Genitourinary: Positive for vaginal discharge and vaginal pain. Negative for vaginal bleeding.       Objective:   Physical Exam Vitals signs reviewed.  Constitutional:      General: She is not in acute distress.    Appearance: She is well-developed.  HENT:     Head: Normocephalic and atraumatic.  Eyes:     Conjunctiva/sclera: Conjunctivae normal.  Cardiovascular:     Rate and Rhythm: Normal rate.  Pulmonary:     Effort: Pulmonary effort is normal.  Abdominal:     General: Abdomen is flat.     Palpations: Abdomen is soft.  Genitourinary:    Comments: Tanner V Vagina pale pink, small amount of white chunky discharge Cervix-no lesion, non tender  Skin:    General: Skin is warm and dry.  Neurological:     Mental Status: She is alert and oriented to person, place, and time.       Assessment & Plan:  33 yo female with signs and symptoms of yeast. 1.  Doing better with Terazol.  Aptima repeated and culture sent. 2.  Liletta inserted  IUD Procedure Note Patient identified, informed consent performed.  Discussed risks of irregular bleeding, cramping, infection, malpositioning or misplacement of the IUD outside the uterus which may require further procedures. Time out was performed.  Urine pregnancy test negative.  Speculum placed in the vagina.  Cervix visualized.  Cleaned with Betadine x 2.  Grasped anteriorly with a single tooth tenaculum.  Uterus sounded to 8 cm.   Liletta IUD placed per manufacturer's recommendations.  Strings trimmed to 2 cm. Tenaculum was removed, good hemostasis noted.  Patient tolerated procedure well.   Patient was given post-procedure instructions and the Liletta care card with expiration date.  Patient was also asked to check IUD strings periodically and follow up in 4-6 weeks for IUD check.

## 2019-02-21 LAB — CERVICOVAGINAL ANCILLARY ONLY
Bacterial vaginitis: NEGATIVE
Candida vaginitis: NEGATIVE
Chlamydia: NEGATIVE
Neisseria Gonorrhea: NEGATIVE
Trichomonas: NEGATIVE

## 2019-03-01 LAB — FUNGUS CULTURE W SMEAR
MICRO NUMBER:: 391474
SMEAR:: NONE SEEN
SPECIMEN QUALITY:: ADEQUATE

## 2019-03-16 ENCOUNTER — Ambulatory Visit: Payer: BLUE CROSS/BLUE SHIELD | Admitting: Obstetrics & Gynecology

## 2019-04-04 ENCOUNTER — Encounter: Payer: Self-pay | Admitting: Internal Medicine

## 2019-04-06 ENCOUNTER — Ambulatory Visit (INDEPENDENT_AMBULATORY_CARE_PROVIDER_SITE_OTHER): Payer: BLUE CROSS/BLUE SHIELD | Admitting: Obstetrics & Gynecology

## 2019-04-06 ENCOUNTER — Other Ambulatory Visit: Payer: Self-pay

## 2019-04-06 ENCOUNTER — Encounter: Payer: Self-pay | Admitting: Obstetrics & Gynecology

## 2019-04-06 VITALS — BP 121/84 | HR 91 | Ht 62.0 in | Wt 147.0 lb

## 2019-04-06 DIAGNOSIS — N760 Acute vaginitis: Secondary | ICD-10-CM

## 2019-04-06 DIAGNOSIS — B9689 Other specified bacterial agents as the cause of diseases classified elsewhere: Secondary | ICD-10-CM | POA: Diagnosis not present

## 2019-04-06 DIAGNOSIS — N898 Other specified noninflammatory disorders of vagina: Secondary | ICD-10-CM

## 2019-04-06 DIAGNOSIS — Z30431 Encounter for routine checking of intrauterine contraceptive device: Secondary | ICD-10-CM

## 2019-04-06 NOTE — Progress Notes (Signed)
   Subjective:    Patient ID: Julia Jimenez, female    DOB: 01/08/1986, 33 y.o.   MRN: [id number]  HPI 33 yo separated P0 here for string check. She is still having irregular spotting and some cramping.  She is also complaining of a vaginal odor.  Review of Systems Pap utd   Objective:   Physical Exam Breathing, conversing, and ambulating normally Well nourished, well hydrated White female, no apparent distress Speculum exam reveals no vaginal discharge, IUD strings about 2 cm long     Assessment & Plan:  Bleeding with Liletta- I gave her a sample of tatulaya OCPs to be used prn Vaginal odor- wet prep sent Rec boric acid supp prn

## 2019-04-07 LAB — CERVICOVAGINAL ANCILLARY ONLY
Bacterial vaginitis: POSITIVE — AB
Candida vaginitis: NEGATIVE

## 2019-04-10 DIAGNOSIS — B9689 Other specified bacterial agents as the cause of diseases classified elsewhere: Secondary | ICD-10-CM

## 2019-04-10 MED ORDER — METRONIDAZOLE 500 MG PO TABS: 500.0000 mg | ORAL_TABLET | Freq: Two times a day (BID) | ORAL | 0 refills | Status: DC

## 2019-04-10 NOTE — Telephone Encounter (Signed)
Pt notified through MyChart about positive BV. Flagyl sent to pharmacy.

## 2019-07-05 ENCOUNTER — Other Ambulatory Visit: Payer: Self-pay | Admitting: Internal Medicine

## 2019-07-12 ENCOUNTER — Encounter: Payer: Self-pay | Admitting: Internal Medicine

## 2019-07-24 ENCOUNTER — Encounter: Payer: Self-pay | Admitting: Orthopaedic Surgery

## 2019-07-24 ENCOUNTER — Other Ambulatory Visit: Payer: Self-pay

## 2019-07-24 ENCOUNTER — Ambulatory Visit: Payer: Self-pay

## 2019-07-24 ENCOUNTER — Ambulatory Visit (INDEPENDENT_AMBULATORY_CARE_PROVIDER_SITE_OTHER): Payer: BC Managed Care – PPO | Admitting: Orthopaedic Surgery

## 2019-07-24 DIAGNOSIS — M542 Cervicalgia: Secondary | ICD-10-CM | POA: Diagnosis not present

## 2019-07-24 MED ORDER — METHOCARBAMOL 500 MG PO TABS: 500.0000 mg | ORAL_TABLET | Freq: Four times a day (QID) | ORAL | 0 refills | Status: DC | PRN

## 2019-07-24 MED ORDER — METHYLPREDNISOLONE 4 MG PO TABS: ORAL_TABLET | ORAL | 0 refills | Status: DC

## 2019-07-24 NOTE — Progress Notes (Signed)
Office Visit Note   Patient: Julia Jimenez           Date of Birth: 25-Mar-1986           MRN: [id number] Visit Date: 07/24/2019              Requested by: Pincus SanesBurns, Stacy J, MD [address] [place],  [place] [id number] PCP: Pincus SanesBurns, Stacy J, MD   Assessment & Plan: Visit Diagnoses:  1. Neck pain     Plan: Based on her clinical exam combined with the x-ray findings an MRI is warranted of the cervical spine to rule out herniated disc.  We will also obtain nerve conduction studies of the right upper extremity to rule out a double crush phenomena that can be seen with cubital or carpal tunnel syndrome as well.  Given her overall numbness and weakness I do feel that both the studies are warranted.  We will send her to physical therapy for some cervical traction in the interim.  I am also going to try a steroid Dosepak and a muscle relaxant.  All question concerns were answered and addressed.  I will see her back after the studies.  Follow-Up Instructions: No follow-ups on file.   Orders:  Orders Placed This Encounter  Procedures  . XR Cervical Spine 2 or 3 views   Meds ordered this encounter  Medications  . methylPREDNISolone (MEDROL) 4 MG tablet    Sig: Medrol dose pack. Take as instructed    Dispense:  21 tablet    Refill:  0  . methocarbamol (ROBAXIN) 500 MG tablet    Sig: Take 1 tablet (500 mg total) by mouth every 6 (six) hours as needed for muscle spasms.    Dispense:  60 tablet    Refill:  0      Procedures: No procedures performed   Clinical Data: No additional findings.   Subjective: Chief Complaint  Patient presents with  . 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  This has been getting worse over the last few weeks to few months.  She is getting weakness in her right dominant hand as well.  She has been taking  anti-inflammatories and is been to massage therapy.  She feels tightness around her parascapular area.  She denies any shoulder weakness or pain in the shoulder itself.  She does report neck stiffness to the right side as well.  She denies any recent injuries.  HPI  Review of Systems She currently denies any headache, chest pain, shortness of breath, fever, chills, nausea, vomiting  Objective: Vital Signs: There were no vitals taken for this visit.  Physical Exam She is awake and alert and oriented x3 and in no acute distress Ortho Exam Examination of her neck shows quite a bit of stiffness to the right side with rotation and lateral bending.  She does have a positive Spurling sign to the right side.  There is parascapular pain.  She does have some numbness down the dorsal aspect of her right arm into her hand.  This is her dominant right side that is slightly weak with grip strength comparing the right and left sides.  She shows a little bit of irritation with a Tinel sign over the cubital tunnel and carpal tunnel.  There is no atrophy in the muscles of her hands or her  parascapular area and shoulder.  She has 5 out of 5 strength of the rotator cuff.  Her biceps and triceps strength are near normal on the right side. Specialty Comments:  No specialty comments available.  Imaging: Xr Cervical Spine 2 Or 3 Views  Result Date: 07/24/2019 2 views of the cervical spine show a loss of cervical lordosis on the lateral view.  There is even a slight kyphosis between C2 and C3-C3 and C4.  There is also a small anterior bone spur between C5 and C6    PMFS History: Patient Active Problem List   Diagnosis Date Noted  . Chronic maxillary sinusitis 11/10/2018  . Depression 09/10/2018  . Sleep difficulties 09/10/2018  . Cervical lymphadenopathy 09/24/2017  . Family history of diabetes mellitus 09/24/2017  . Skin lumps 08/13/2017  . 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 Vaginal yeast  infection 06/28/2017  . Bursitis of shoulder, left 05/04/2017  . Fatigue 08/25/2016  . Anxiety 09/22/2015  . Non-allergic rhinitis 06/28/2014   Past Medical History:  Diagnosis Date  . Allergic rhinitis   . Anemia    in high school  . Anxiety   . Elevated blood pressure reading without diagnosis of hypertension    in context of Adderall initiation  . History of chicken pox   . Infertility, female   . Ovarian cyst, right 2014    Family History  Problem Relation Age of Onset  . Hypertension Mother   . Depression Mother   . Anxiety disorder Mother   . Diabetes Father   . Hypertension Brother   . Depression Brother   . Diabetes Maternal Aunt   . Stroke Maternal Uncle   . Diabetes Maternal Uncle        x2  . Diabetes Paternal Grandmother   . Heart attack Paternal Grandmother   . Cancer Maternal Grandfather        Dec CA of unknown origin  . Hepatitis C Maternal Grandmother        Dec    Past Surgical History:  Procedure Laterality Date  . TONSILLECTOMY AND ADENOIDECTOMY    . WISDOM TOOTH EXTRACTION     Social History   Occupational History  . Occupation: Pharmacist, community  Tobacco Use  . Smoking status: Never Smoker  . Smokeless tobacco: Never Used  Substance and Sexual Activity  . Alcohol use: Yes    Alcohol/week: 2.0 standard drinks    Types: 2 Standard drinks or equivalent per week    Comment: occasionally  . Drug use: No  . Sexual activity: Yes    Partners: Male    Birth control/protection: None, Pill    Comment: Mirena IUD inserted 06-21-13

## 2019-07-25 ENCOUNTER — Other Ambulatory Visit: Payer: Self-pay

## 2019-07-25 DIAGNOSIS — M542 Cervicalgia: Secondary | ICD-10-CM

## 2019-07-28 DIAGNOSIS — H52223 Regular astigmatism, bilateral: Secondary | ICD-10-CM | POA: Diagnosis not present

## 2019-07-28 DIAGNOSIS — H5213 Myopia, bilateral: Secondary | ICD-10-CM | POA: Diagnosis not present

## 2019-08-04 DIAGNOSIS — M542 Cervicalgia: Secondary | ICD-10-CM | POA: Diagnosis not present

## 2019-08-04 DIAGNOSIS — F4323 Adjustment disorder with mixed anxiety and depressed mood: Secondary | ICD-10-CM | POA: Diagnosis not present

## 2019-08-07 DIAGNOSIS — M542 Cervicalgia: Secondary | ICD-10-CM | POA: Diagnosis not present

## 2019-08-09 ENCOUNTER — Other Ambulatory Visit: Payer: Self-pay | Admitting: Internal Medicine

## 2019-08-16 DIAGNOSIS — M542 Cervicalgia: Secondary | ICD-10-CM | POA: Diagnosis not present

## 2019-08-18 ENCOUNTER — Encounter: Payer: BC Managed Care – PPO | Admitting: Physical Medicine and Rehabilitation

## 2019-08-18 ENCOUNTER — Other Ambulatory Visit: Payer: BC Managed Care – PPO

## 2019-08-21 DIAGNOSIS — M542 Cervicalgia: Secondary | ICD-10-CM | POA: Diagnosis not present

## 2019-08-25 ENCOUNTER — Encounter: Payer: BC Managed Care – PPO | Admitting: Physical Medicine and Rehabilitation

## 2019-08-30 DIAGNOSIS — M545 Low back pain: Secondary | ICD-10-CM | POA: Diagnosis not present

## 2019-08-30 DIAGNOSIS — M542 Cervicalgia: Secondary | ICD-10-CM | POA: Diagnosis not present

## 2019-09-22 DIAGNOSIS — M542 Cervicalgia: Secondary | ICD-10-CM | POA: Diagnosis not present

## 2019-09-22 DIAGNOSIS — M545 Low back pain: Secondary | ICD-10-CM | POA: Diagnosis not present

## 2019-09-26 ENCOUNTER — Other Ambulatory Visit: Payer: Self-pay | Admitting: Internal Medicine

## 2019-10-19 DIAGNOSIS — L710 Perioral dermatitis: Secondary | ICD-10-CM | POA: Diagnosis not present

## 2019-10-20 DIAGNOSIS — M545 Low back pain: Secondary | ICD-10-CM | POA: Diagnosis not present

## 2019-10-20 DIAGNOSIS — M542 Cervicalgia: Secondary | ICD-10-CM | POA: Diagnosis not present

## 2019-11-01 ENCOUNTER — Other Ambulatory Visit: Payer: Self-pay | Admitting: Internal Medicine

## 2019-11-08 ENCOUNTER — Other Ambulatory Visit: Payer: Self-pay

## 2019-11-08 ENCOUNTER — Encounter: Payer: Self-pay | Admitting: Internal Medicine

## 2019-11-08 DIAGNOSIS — N760 Acute vaginitis: Secondary | ICD-10-CM

## 2019-11-08 DIAGNOSIS — B9689 Other specified bacterial agents as the cause of diseases classified elsewhere: Secondary | ICD-10-CM

## 2019-11-08 MED ORDER — METRONIDAZOLE 500 MG PO TABS: 500.0000 mg | ORAL_TABLET | Freq: Two times a day (BID) | ORAL | 0 refills | Status: DC

## 2019-11-08 NOTE — Progress Notes (Signed)
Flagyl sent to pharmacy per Dr Hulan Fray

## 2019-11-30 NOTE — Progress Notes (Signed)
Virtual Visit via Video Note  I connected with Julia Jimenez on 12/01/19 at  9:45 AM EST by a video enabled telemedicine application and verified that I am speaking with the correct person using two identifiers.   I discussed the limitations of evaluation and management by telemedicine and the availability of in person appointments. The patient expressed understanding and agreed to proceed.  Present for the visit:  Myself, Dr Billey Gosling, Axel Filler.  The patient is currently at home and I am in the office.    No referring provider.    History of Present Illness: This is an acute visit for right ear discomfort  She has had her first covid vaccine and the second will be next week.    Over the past month she had had a feeling of intermittent water in her right ear.  The past two days it has been persistent.  The pain is not severe, just discomfort that has also gotten worse.  She notes some mild hearing loss and some minimal sinus pressure.  She denies other symptoms.  She does take an allergy medication and tried a decongestant, but that did not help.  She was concerned about a possible ear infection.   Review of Systems  Constitutional: Negative for chills and fever.  HENT: Positive for ear pain, hearing loss and sinus pain (minimal). Negative for congestion and sore throat.   Respiratory: Negative for cough, shortness of breath and wheezing.   Neurological: Negative for dizziness and headaches.     Social History   Socioeconomic History  . Marital status: Legally Separated    Spouse name: Not on file  . Number of children: Not on file  . Years of education: Not on file  . Highest education level: Not on file  Occupational History  . Occupation: Pharmacist, community  Tobacco Use  . Smoking status: Never Smoker  . Smokeless tobacco: Never Used  Substance and Sexual Activity  . Alcohol use: Yes    Alcohol/week: 2.0 standard drinks    Types: 2 Standard drinks or equivalent per week     Comment: occasionally  . Drug use: No  . Sexual activity: Yes    Partners: Male    Birth control/protection: None, Pill    Comment: Mirena IUD inserted 06-21-13  Other Topics Concern  . Not on file  Social History Narrative   Married    2 people living in household   Employed    Highest education level DDS   Occupation Dentist   7-8 hours of sleep a night    Social Determinants of Health   Financial Resource Strain:   . Difficulty of Paying Living Expenses: Not on file  Food Insecurity:   . Worried About Charity fundraiser in the Last Year: Not on file  . Ran Out of Food in the Last Year: Not on file  Transportation Needs:   . Lack of Transportation (Medical): Not on file  . Lack of Transportation (Non-Medical): Not on file  Physical Activity:   . Days of Exercise per Week: Not on file  . Minutes of Exercise per Session: Not on file  Stress:   . Feeling of Stress : Not on file  Social Connections:   . Frequency of Communication with Friends and Family: Not on file  . Frequency of Social Gatherings with Friends and Family: Not on file  . Attends Religious Services: Not on file  . Active Member of Clubs or Organizations: Not on file  .  Attends Banker Meetings: Not on file  . Marital Status: Not on file     Observations/Objective: Appears well in NAD Breathing normally and speaking in full sentences Skin appears warm and dry  Assessment and Plan:  See Problem List for Assessment and Plan of chronic medical problems.   Follow Up Instructions:    I discussed the assessment and treatment plan with the patient. The patient was provided an opportunity to ask questions and all were answered. The patient agreed with the plan and demonstrated an understanding of the instructions.   The patient was advised to call back or seek an in-person evaluation if the symptoms worsen or if the condition fails to improve as anticipated.    Pincus Sanes, MD

## 2019-12-01 ENCOUNTER — Ambulatory Visit (INDEPENDENT_AMBULATORY_CARE_PROVIDER_SITE_OTHER): Payer: BC Managed Care – PPO | Admitting: Internal Medicine

## 2019-12-01 ENCOUNTER — Encounter: Payer: Self-pay | Admitting: Internal Medicine

## 2019-12-01 DIAGNOSIS — H6981 Other specified disorders of Eustachian tube, right ear: Secondary | ICD-10-CM

## 2019-12-01 DIAGNOSIS — F4323 Adjustment disorder with mixed anxiety and depressed mood: Secondary | ICD-10-CM | POA: Diagnosis not present

## 2019-12-01 MED ORDER — AMOXICILLIN-POT CLAVULANATE 875-125 MG PO TABS: 1.0000 | ORAL_TABLET | Freq: Two times a day (BID) | ORAL | 0 refills | Status: DC

## 2019-12-01 MED ORDER — FLUCONAZOLE 150 MG PO TABS: 150.0000 mg | ORAL_TABLET | Freq: Once | ORAL | 0 refills | Status: AC

## 2019-12-01 MED ORDER — FLUTICASONE PROPIONATE 50 MCG/ACT NA SUSP: 2.0000 | Freq: Every day | NASAL | 6 refills | Status: DC

## 2019-12-01 NOTE — Addendum Note (Signed)
Addended by: Pincus Sanes on: 12/01/2019 12:41 PM   Modules accepted: Orders

## 2019-12-01 NOTE — Assessment & Plan Note (Signed)
Probable right eustachian tube dysfunction with possible otitis media Unfortunately the visit is for sulci not able to look in her ear to know for sure if she has an infection, but I am concerned if she does not have when she is developing 1 Start Augmentin twice daily, Diflucan since she typically develops a yeast infection after antibiotics Flonase nasal spray Continue daily antihistamine  Call if symptoms do not improve or completely resolve

## 2019-12-05 ENCOUNTER — Other Ambulatory Visit: Payer: Self-pay | Admitting: Internal Medicine

## 2019-12-09 DIAGNOSIS — F4323 Adjustment disorder with mixed anxiety and depressed mood: Secondary | ICD-10-CM | POA: Diagnosis not present

## 2019-12-18 DIAGNOSIS — M542 Cervicalgia: Secondary | ICD-10-CM | POA: Diagnosis not present

## 2019-12-18 DIAGNOSIS — M545 Low back pain: Secondary | ICD-10-CM | POA: Diagnosis not present

## 2019-12-25 DIAGNOSIS — R2231 Localized swelling, mass and lump, right upper limb: Secondary | ICD-10-CM | POA: Diagnosis not present

## 2019-12-25 DIAGNOSIS — M79644 Pain in right finger(s): Secondary | ICD-10-CM | POA: Diagnosis not present

## 2020-01-01 DIAGNOSIS — M542 Cervicalgia: Secondary | ICD-10-CM | POA: Diagnosis not present

## 2020-01-01 DIAGNOSIS — M545 Low back pain: Secondary | ICD-10-CM | POA: Diagnosis not present

## 2020-01-22 DIAGNOSIS — M542 Cervicalgia: Secondary | ICD-10-CM | POA: Diagnosis not present

## 2020-01-22 DIAGNOSIS — M545 Low back pain: Secondary | ICD-10-CM | POA: Diagnosis not present

## 2020-02-06 ENCOUNTER — Other Ambulatory Visit: Payer: Self-pay | Admitting: Internal Medicine

## 2020-03-11 ENCOUNTER — Encounter: Payer: Self-pay | Admitting: Internal Medicine

## 2020-03-11 DIAGNOSIS — Z0000 Encounter for general adult medical examination without abnormal findings: Secondary | ICD-10-CM

## 2020-03-18 NOTE — Addendum Note (Signed)
Addended by: Pincus Sanes on: 03/18/2020 07:19 PM   Modules accepted: Orders

## 2020-03-20 ENCOUNTER — Other Ambulatory Visit (INDEPENDENT_AMBULATORY_CARE_PROVIDER_SITE_OTHER): Payer: BC Managed Care – PPO

## 2020-03-20 ENCOUNTER — Other Ambulatory Visit: Payer: Self-pay

## 2020-03-20 DIAGNOSIS — R110 Nausea: Secondary | ICD-10-CM | POA: Diagnosis not present

## 2020-03-20 DIAGNOSIS — Z0000 Encounter for general adult medical examination without abnormal findings: Secondary | ICD-10-CM | POA: Diagnosis not present

## 2020-03-20 LAB — CBC WITH DIFFERENTIAL/PLATELET
Basophils Absolute: 0.1 10*3/uL (ref 0.0–0.1)
Basophils Relative: 1.3 % (ref 0.0–3.0)
Eosinophils Absolute: 0.7 10*3/uL (ref 0.0–0.7)
Eosinophils Relative: 11.4 % — ABNORMAL HIGH (ref 0.0–5.0)
HCT: 40.5 % (ref 36.0–46.0)
Hemoglobin: 13.6 g/dL (ref 12.0–15.0)
Lymphocytes Relative: 35.1 % (ref 12.0–46.0)
Lymphs Abs: 2.1 10*3/uL (ref 0.7–4.0)
MCHC: 33.5 g/dL (ref 30.0–36.0)
MCV: 85.7 fl (ref 78.0–100.0)
Monocytes Absolute: 0.5 10*3/uL (ref 0.1–1.0)
Monocytes Relative: 7.8 % (ref 3.0–12.0)
Neutro Abs: 2.6 10*3/uL (ref 1.4–7.7)
Neutrophils Relative %: 44.4 % (ref 43.0–77.0)
Platelets: 183 10*3/uL (ref 150.0–400.0)
RBC: 4.72 Mil/uL (ref 3.87–5.11)
RDW: 13.7 % (ref 11.5–15.5)
WBC: 5.9 10*3/uL (ref 4.0–10.5)

## 2020-03-20 LAB — LIPID PANEL
Cholesterol: 162 mg/dL (ref 0–200)
HDL: 65.9 mg/dL (ref >39.00)
LDL Cholesterol: 85 mg/dL (ref 0–99)
NonHDL: 95.87
Total CHOL/HDL Ratio: 2
Triglycerides: 56 mg/dL (ref 0.0–149.0)
VLDL: 11.2 mg/dL (ref 0.0–40.0)

## 2020-03-20 LAB — COMPREHENSIVE METABOLIC PANEL
ALT: 13 U/L (ref 0–35)
AST: 17 U/L (ref 0–37)
Albumin: 4.5 g/dL (ref 3.5–5.2)
Alkaline Phosphatase: 34 U/L — ABNORMAL LOW (ref 39–117)
BUN: 11 mg/dL (ref 6–23)
CO2: 29 mEq/L (ref 19–32)
Calcium: 9.1 mg/dL (ref 8.4–10.5)
Chloride: 103 mEq/L (ref 96–112)
Creatinine, Ser: 0.77 mg/dL (ref 0.40–1.20)
GFR: 85.4 mL/min (ref >60.00)
Glucose, Bld: 125 mg/dL — ABNORMAL HIGH (ref 70–99)
Potassium: 3.8 mEq/L (ref 3.5–5.1)
Sodium: 138 mEq/L (ref 135–145)
Total Bilirubin: 0.5 mg/dL (ref 0.2–1.2)
Total Protein: 6.9 g/dL (ref 6.0–8.3)

## 2020-03-20 LAB — TSH: TSH: 1.27 u[IU]/mL (ref 0.35–4.50)

## 2020-03-20 LAB — HCG, SERUM, QUALITATIVE: Preg, Serum: NEGATIVE

## 2020-03-21 ENCOUNTER — Encounter: Payer: Self-pay | Admitting: Internal Medicine

## 2020-03-21 NOTE — Progress Notes (Signed)
Subjective:    Patient ID: Julia Jimenez, female    DOB: [birth year], 34 y.o.   MRN: [id number]  HPI She is here for a physical exam.   She has been having a weird stomach pain fro about three weeks and she is nauseous.  Her pregnancy tests have been negative.   It is usually mid day after she eats.  It occurs less at night.  Sometimes eating helps it go away.  She has coffee and breakfast and she has cramps and nausea and sometimes eating a little will often help.  The cramping is just under her diaphragm. She does get heartburn - about once a month.  There are no obvious food triggers.    Having back issues from work.   Has seen ortho and did PT.  Weekly goes to massage, adjustments.  Taking a muscle relaxer on occasion does help. She feels sciatica-like pain go down her right leg.  She does state some pain in her buttock region.   Medications and allergies reviewed with patient and updated if appropriate.  Patient Active Problem List   Diagnosis Date Noted  . Eustachian tube dysfunction, right 12/01/2019  . Chronic maxillary sinusitis 11/10/2018  . Depression 09/10/2018  . Sleep difficulties 09/10/2018  . Cervical lymphadenopathy 09/24/2017  . Family history of diabetes mellitus 09/24/2017  . Skin lumps 08/13/2017  . Bursitis of shoulder, left 05/04/2017  . Fatigue 08/25/2016  . Anxiety 09/22/2015  . 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 ALPRAZolam (XANAX) 0.5 MG tablet Take 0.5-1 tablets (0.25-0.5 mg total) by mouth at bedtime as needed for anxiety. 30 tablet 0  . Levonorgestrel (LILETTA) 19.5 MCG/DAY IUD IUD 1 each by Intrauterine route once.    . methocarbamol (ROBAXIN) 500 MG tablet Take 1 tablet (500 mg total) by mouth every 6 (six) hours as needed for muscle spasms. 60 tablet 0  . sertraline (ZOLOFT) 100 MG tablet TAKE 1 TABLET(100 MG) BY MOUTH DAILY 90 tablet 1  . traZODone  (DESYREL) 50 MG tablet TAKE 1/2 TO 1 TABLET BY MOUTH AT BEDTIME AS NEEDED FOR SLEEP 30 tablet 3   No current facility-administered medications on file prior to visit.    Past Medical History:  Diagnosis Date  . Allergic rhinitis   . Anemia    in high school  . Anxiety   . Elevated blood pressure reading without diagnosis of hypertension    in context of Adderall initiation  . History of chicken pox   . Infertility, female   . Ovarian cyst, right 2014    Past Surgical History:  Procedure Laterality Date  . TONSILLECTOMY AND ADENOIDECTOMY    . WISDOM TOOTH EXTRACTION      Social History   Socioeconomic History  . Marital status: Legally Separated    Spouse name: Not on file  . Number of children: Not on file  . Years of education: Not on file  . Highest education level: Not on file  Occupational History  . Occupation: Education officer, community  Tobacco Use  . Smoking status: Never Smoker  . Smokeless tobacco: Never Used  Substance and Sexual Activity  . Alcohol use: Yes    Alcohol/week: 2.0 standard drinks    Types: 2 Standard drinks or equivalent per week    Comment: occasionally  . Drug use: No  . Sexual activity: Yes    Partners: Male    Birth control/protection:  None, Pill    Comment: Mirena IUD inserted 06-21-13  Other Topics Concern  . Not on file  Social History Narrative   Married    2 people living in household   Employed    Highest education level DDS   Occupation Dentist   7-8 hours of sleep a night    Social Determinants of Health   Financial Resource Strain:   . Difficulty of Paying Living Expenses:   Food Insecurity:   . Worried About Programme researcher, broadcasting/film/video in the Last Year:   . Barista in the Last Year:   Transportation Needs:   . Freight forwarder (Medical):   Marland Kitchen Lack of Transportation (Non-Medical):   Physical Activity:   . Days of Exercise per Week:   . Minutes of Exercise per Session:   Stress:   . Feeling of Stress :   Social  Connections:   . Frequency of Communication with Friends and Family:   . Frequency of Social Gatherings with Friends and Family:   . Attends Religious Services:   . Active Member of Clubs or Organizations:   . Attends Banker Meetings:   Marland Kitchen Marital Status:     Family History  Problem Relation Age of Onset  . Hypertension Mother   . Depression Mother   . Anxiety disorder Mother   . Diabetes Father   . Hypertension Brother   . Depression Brother   . Diabetes Maternal Aunt   . Stroke Maternal Uncle   . Diabetes Maternal Uncle        x2  . Diabetes Paternal Grandmother   . Heart attack Paternal Grandmother   . Cancer Maternal Grandfather        Dec CA of unknown origin  . Hepatitis C Maternal Grandmother        Dec    Review of Systems  Constitutional: Negative for chills and fever.  HENT: Positive for postnasal drip.   Eyes: Negative for visual disturbance.  Respiratory: Positive for cough (allergies). Negative for shortness of breath and wheezing.   Cardiovascular: Negative for chest pain, palpitations and leg swelling.  Gastrointestinal: Positive for abdominal pain, diarrhea (separate from cramping) and nausea. Negative for blood in stool and constipation.       Occ GERD  Genitourinary: Negative for dysuria and hematuria.  Musculoskeletal: Positive for back pain.  Skin: Negative for color change and rash.  Neurological: Positive for headaches (allergies). Negative for light-headedness.  Psychiatric/Behavioral: Positive for dysphoric mood (controlled). The patient is nervous/anxious (controlled).        Objective:   Vitals:   03/22/20 0904  BP: 124/78  Pulse: 71  Resp: 16  Temp: 98.4 F (36.9 C)  SpO2: 99%   Filed Weights   03/22/20 0904  Weight: 146 lb (66.2 kg)   Body mass index is 26.7 kg/m.  BP Readings from Last 3 Encounters:  03/22/20 124/78  04/06/19 121/84  02/20/19 111/77    Wt Readings from Last 3 Encounters:  03/22/20 146 lb  (66.2 kg)  04/06/19 147 lb (66.7 kg)  02/20/19 144 lb (65.3 kg)     Physical Exam Constitutional: She appears well-developed and well-nourished. No distress.  HENT:  Head: Normocephalic and atraumatic.  Right Ear: External ear normal. Normal ear canal and TM Left Ear: External ear normal.  Normal ear canal and TM Mouth/Throat: Oropharynx is clear and moist.  Eyes: Conjunctivae and EOM are normal.  Neck: Neck supple. No tracheal deviation present.  No thyromegaly present.  No carotid bruit  Cardiovascular: Normal rate, regular rhythm and normal heart sounds.   No murmur heard.  No edema. Pulmonary/Chest: Effort normal and breath sounds normal. No respiratory distress. She has no wheezes. She has no rales.  Breast: deferred   Abdominal: Soft. She exhibits no distension. There is no tenderness.  Lymphadenopathy: She has no cervical adenopathy.  Skin: Skin is warm and dry. She is not diaphoretic.  Psychiatric: She has a normal mood and affect. Her behavior is normal.        Assessment & Plan:   Physical exam: Screening blood work    reviewed Immunizations  tdap today, others up to date Gyn  Up to date  Exercise  regular   Weight  Mildly overweight Substance abuse  none  See Problem List for Assessment and Plan of chronic medical problems.      This visit occurred during the SARS-CoV-2 public health emergency.  Safety protocols were in place, including screening questions prior to the visit, additional usage of staff PPE, and extensive cleaning of exam room while observing appropriate contact time as indicated for disinfecting solutions.

## 2020-03-21 NOTE — Patient Instructions (Addendum)
Blood work was reviewed.  An abdominal ultrasound was ordered.   All other Health Maintenance issues reviewed.   All recommended immunizations and age-appropriate screenings are up-to-date or discussed.  Tetanus immunization administered today.   Medications reviewed and updated.  Changes include :   Start pepcid 40 mg daily for heartburn.    Your prescription(s) have been submitted to your pharmacy. Please take as directed and contact our office if you believe you are having problem(s) with the medication(s).   Please followup in 1 year    Health Maintenance, Female Adopting a healthy lifestyle and getting preventive care are important in promoting health and wellness. Ask your health care provider about:  The right schedule for you to have regular tests and exams.  Things you can do on your own to prevent diseases and keep yourself healthy. What should I know about diet, weight, and exercise? Eat a healthy diet   Eat a diet that includes plenty of vegetables, fruits, low-fat dairy products, and lean protein.  Do not eat a lot of foods that are high in solid fats, added sugars, or sodium. Maintain a healthy weight Body mass index (BMI) is used to identify weight problems. It estimates body fat based on height and weight. Your health care provider can help determine your BMI and help you achieve or maintain a healthy weight. Get regular exercise Get regular exercise. This is one of the most important things you can do for your health. Most adults should:  Exercise for at least 150 minutes each week. The exercise should increase your heart rate and make you sweat (moderate-intensity exercise).  Do strengthening exercises at least twice a week. This is in addition to the moderate-intensity exercise.  Spend less time sitting. Even light physical activity can be beneficial. Watch cholesterol and blood lipids Have your blood tested for lipids and cholesterol at 35 years of age,  then have this test every 5 years. Have your cholesterol levels checked more often if:  Your lipid or cholesterol levels are high.  You are older than 35 years of age.  You are at high risk for heart disease. What should I know about cancer screening? Depending on your health history and family history, you may need to have cancer screening at various ages. This may include screening for:  Breast cancer.  Cervical cancer.  Colorectal cancer.  Skin cancer.  Lung cancer. What should I know about heart disease, diabetes, and high blood pressure? Blood pressure and heart disease  High blood pressure causes heart disease and increases the risk of stroke. This is more likely to develop in people who have high blood pressure readings, are of African descent, or are overweight.  Have your blood pressure checked: ? Every 3-5 years if you are 57-54 years of age. ? Every year if you are 71 years old or older. Diabetes Have regular diabetes screenings. This checks your fasting blood sugar level. Have the screening done:  Once every three years after age 43 if you are at a normal weight and have a low risk for diabetes.  More often and at a younger age if you are overweight or have a high risk for diabetes. What should I know about preventing infection? Hepatitis B If you have a higher risk for hepatitis B, you should be screened for this virus. Talk with your health care provider to find out if you are at risk for hepatitis B infection. Hepatitis C Testing is recommended for:  Everyone born  from [place] through 1965.  Anyone with known risk factors for hepatitis C. Sexually transmitted infections (STIs)  Get screened for STIs, including gonorrhea and chlamydia, if: ? You are sexually active and are younger than 35 years of age. ? You are older than 35 years of age and your health care provider tells you that you are at risk for this type of infection. ? Your sexual activity has  changed since you were last screened, and you are at increased risk for chlamydia or gonorrhea. Ask your health care provider if you are at risk.  Ask your health care provider about whether you are at high risk for HIV. Your health care provider may recommend a prescription medicine to help prevent HIV infection. If you choose to take medicine to prevent HIV, you should first get tested for HIV. You should then be tested every 3 months for as long as you are taking the medicine. Pregnancy  If you are about to stop having your period (premenopausal) and you may become pregnant, seek counseling before you get pregnant.  Take 400 to 800 micrograms (mcg) of folic acid every day if you become pregnant.  Ask for birth control (contraception) if you want to prevent pregnancy. Osteoporosis and menopause Osteoporosis is a disease in which the bones lose minerals and strength with aging. This can result in bone fractures. If you are 79 years old or older, or if you are at risk for osteoporosis and fractures, ask your health care provider if you should:  Be screened for bone loss.  Take a calcium or vitamin D supplement to lower your risk of fractures.  Be given hormone replacement therapy (HRT) to treat symptoms of menopause. Follow these instructions at home: Lifestyle  Do not use any products that contain nicotine or tobacco, such as cigarettes, e-cigarettes, and chewing tobacco. If you need help quitting, ask your health care provider.  Do not use street drugs.  Do not share needles.  Ask your health care provider for help if you need support or information about quitting drugs. Alcohol use  Do not drink alcohol if: ? Your health care provider tells you not to drink. ? You are pregnant, may be pregnant, or are planning to become pregnant.  If you drink alcohol: ? Limit how much you use to 0-1 drink a day. ? Limit intake if you are breastfeeding.  Be aware of how much alcohol is in  your drink. In the [place] one drink equals one 12 oz bottle of beer (355 mL), one 5 oz glass of wine (148 mL), or one 1 oz glass of hard liquor (44 mL). General instructions  Schedule regular health, dental, and eye exams.  Stay current with your vaccines.  Tell your health care provider if: ? You often feel depressed. ? You have ever been abused or do not feel safe at home. Summary  Adopting a healthy lifestyle and getting preventive care are important in promoting health and wellness.  Follow your health care provider's instructions about healthy diet, exercising, and getting tested or screened for diseases.  Follow your health care provider's instructions on monitoring your cholesterol and blood pressure. This information is not intended to replace advice given to you by your health care provider. Make sure you discuss any questions you have with your health care provider. Document Revised: 10/19/2018 Document Reviewed: 10/19/2018 Elsevier Patient Education  2020 [employer].

## 2020-03-22 ENCOUNTER — Ambulatory Visit (INDEPENDENT_AMBULATORY_CARE_PROVIDER_SITE_OTHER): Payer: BC Managed Care – PPO | Admitting: Internal Medicine

## 2020-03-22 ENCOUNTER — Encounter: Payer: Self-pay | Admitting: Internal Medicine

## 2020-03-22 ENCOUNTER — Other Ambulatory Visit: Payer: Self-pay

## 2020-03-22 VITALS — BP 124/78 | HR 71 | Temp 98.4°F | Resp 16 | Ht 62.0 in | Wt 146.0 lb

## 2020-03-22 DIAGNOSIS — M5416 Radiculopathy, lumbar region: Secondary | ICD-10-CM

## 2020-03-22 DIAGNOSIS — F419 Anxiety disorder, unspecified: Secondary | ICD-10-CM

## 2020-03-22 DIAGNOSIS — R1013 Epigastric pain: Secondary | ICD-10-CM

## 2020-03-22 DIAGNOSIS — Z0000 Encounter for general adult medical examination without abnormal findings: Secondary | ICD-10-CM

## 2020-03-22 DIAGNOSIS — G479 Sleep disorder, unspecified: Secondary | ICD-10-CM

## 2020-03-22 DIAGNOSIS — F3289 Other specified depressive episodes: Secondary | ICD-10-CM | POA: Diagnosis not present

## 2020-03-22 DIAGNOSIS — Z23 Encounter for immunization: Secondary | ICD-10-CM | POA: Diagnosis not present

## 2020-03-22 MED ORDER — METHOCARBAMOL 500 MG PO TABS: 500.0000 mg | ORAL_TABLET | Freq: Four times a day (QID) | ORAL | 1 refills | Status: DC | PRN

## 2020-03-22 MED ORDER — FAMOTIDINE 40 MG PO TABS
40.0000 mg | ORAL_TABLET | Freq: Every day | ORAL | 1 refills | Status: DC
Start: 2020-03-22 — End: 2020-11-28

## 2020-03-23 DIAGNOSIS — Z23 Encounter for immunization: Secondary | ICD-10-CM | POA: Insufficient documentation

## 2020-03-23 DIAGNOSIS — R1013 Epigastric pain: Secondary | ICD-10-CM | POA: Insufficient documentation

## 2020-03-23 DIAGNOSIS — M5416 Radiculopathy, lumbar region: Secondary | ICD-10-CM | POA: Insufficient documentation

## 2020-03-23 NOTE — Assessment & Plan Note (Signed)
Chronic Controlled, stable Continue current dose of medication Zoloft 100 mg daily    

## 2020-03-23 NOTE — Assessment & Plan Note (Signed)
Chronic Controlled, stable Continue trazodone 50 mg daily

## 2020-03-23 NOTE — Assessment & Plan Note (Signed)
Chronic Controlled, stable Continue current dose of medication Zoloft 100 mg daily

## 2020-03-23 NOTE — Assessment & Plan Note (Signed)
Acute She has been having symptoms suggestive of lumbar radiculopathy, possibly piriformis syndrome with sciatica She has seen orthopedics and done physical therapy Currently going for weekly massages and adjustments and that is helping She has to use a pillow at work throughout the day and that is likely the trigger Consider referral to sports medicine-she will let me know when and if she wants that referral

## 2020-03-23 NOTE — Assessment & Plan Note (Signed)
Acute Having intermittent epigastric pain/cramping for about 3 weeks.  This is associated with nausea.  Sometimes eating helps Occasional heartburn-maybe once a month No obvious triggers ?  Atypical GERD/gastritis or gallbladder Abdominal ultrasound Recent CBC, CMP within normal limits Start Pepcid 40 mg daily

## 2020-03-25 ENCOUNTER — Telehealth: Payer: Self-pay | Admitting: Internal Medicine

## 2020-03-25 NOTE — Telephone Encounter (Signed)
    Patient calling to report nausea, sharp stomach pain and diarrhea.  Seeking advice. Ultrasound not until 5/20

## 2020-03-25 NOTE — Telephone Encounter (Signed)
Pt scheduled at [facility] on 5/18 @ 3:00 - pt is aware

## 2020-03-25 NOTE — Telephone Encounter (Signed)
Is there any way to try to get her [place] sooner?

## 2020-03-26 DIAGNOSIS — R1013 Epigastric pain: Secondary | ICD-10-CM | POA: Diagnosis not present

## 2020-03-27 ENCOUNTER — Encounter: Payer: Self-pay | Admitting: Internal Medicine

## 2020-03-27 DIAGNOSIS — R1011 Right upper quadrant pain: Secondary | ICD-10-CM

## 2020-03-28 ENCOUNTER — Encounter: Payer: Self-pay | Admitting: Internal Medicine

## 2020-03-28 ENCOUNTER — Other Ambulatory Visit: Payer: BC Managed Care – PPO

## 2020-03-28 DIAGNOSIS — K8020 Calculus of gallbladder without cholecystitis without obstruction: Secondary | ICD-10-CM

## 2020-04-04 ENCOUNTER — Other Ambulatory Visit: Payer: BC Managed Care – PPO

## 2020-04-14 IMAGING — MG DIGITAL DIAGNOSTIC BILATERAL MAMMOGRAM WITH TOMO AND CAD
6 of 10 series · 6 of 30 positions shown · non-contrast
Comparison: Previous exam(s).

CLINICAL DATA: 32-year-old female with physician palpated right
breast lump.

EXAM:
DIGITAL DIAGNOSTIC BILATERAL MAMMOGRAM WITH CAD AND TOMO
ULTRASOUND BILATERAL BREAST

[R MLO synth-2D (1 of 2)]
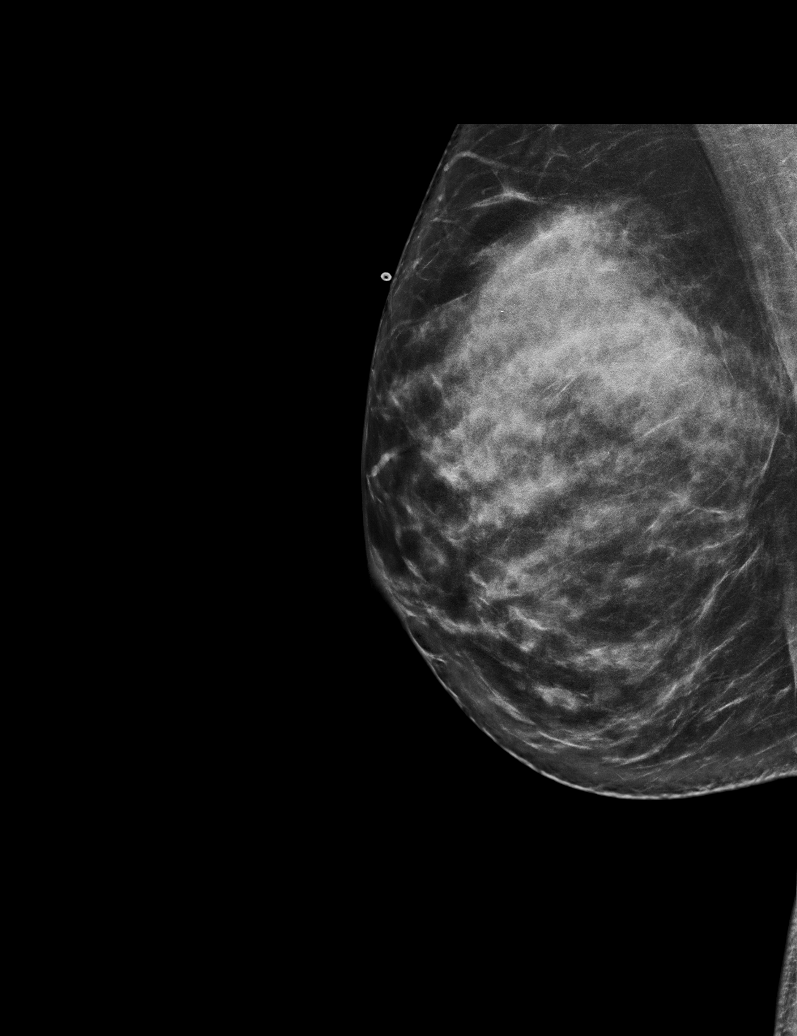

[R MLO synth-2D (2 of 2)]
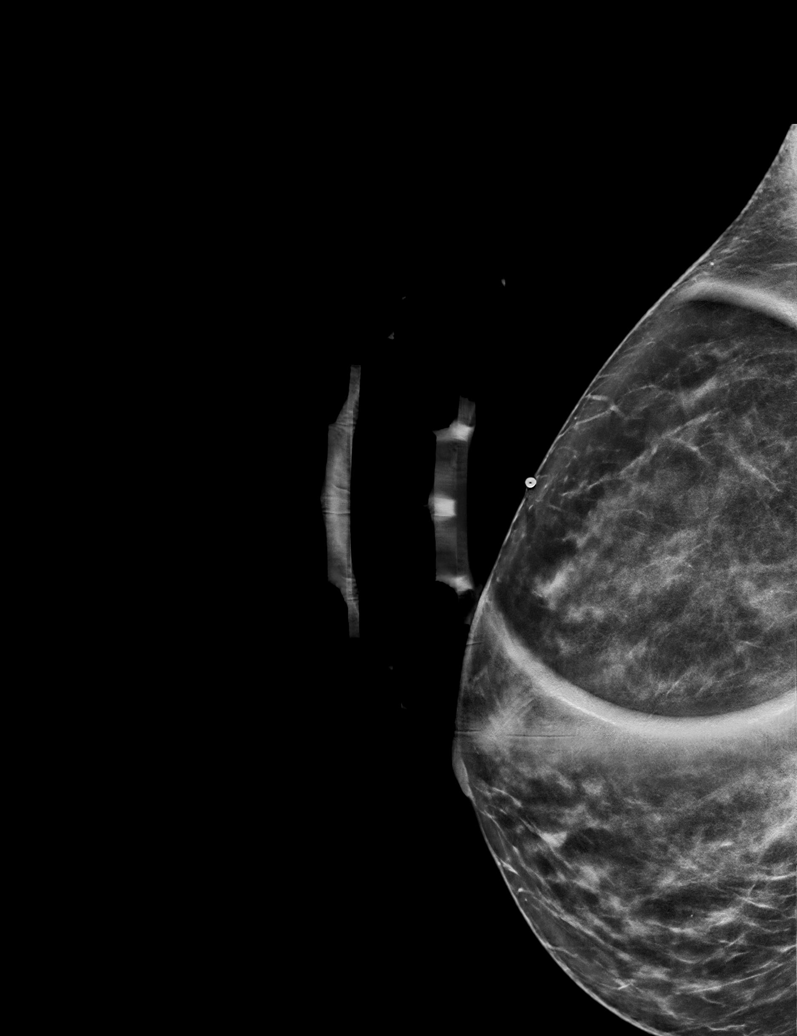

[L CC synth-2D]
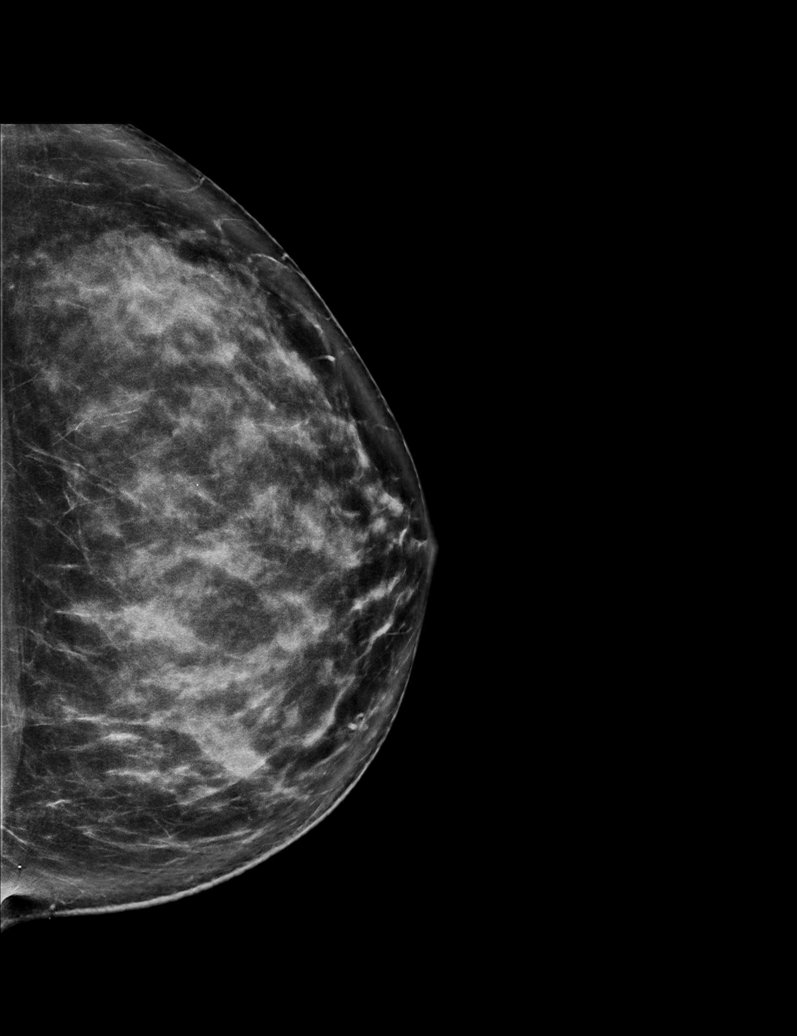

[L MLO synth-2D]
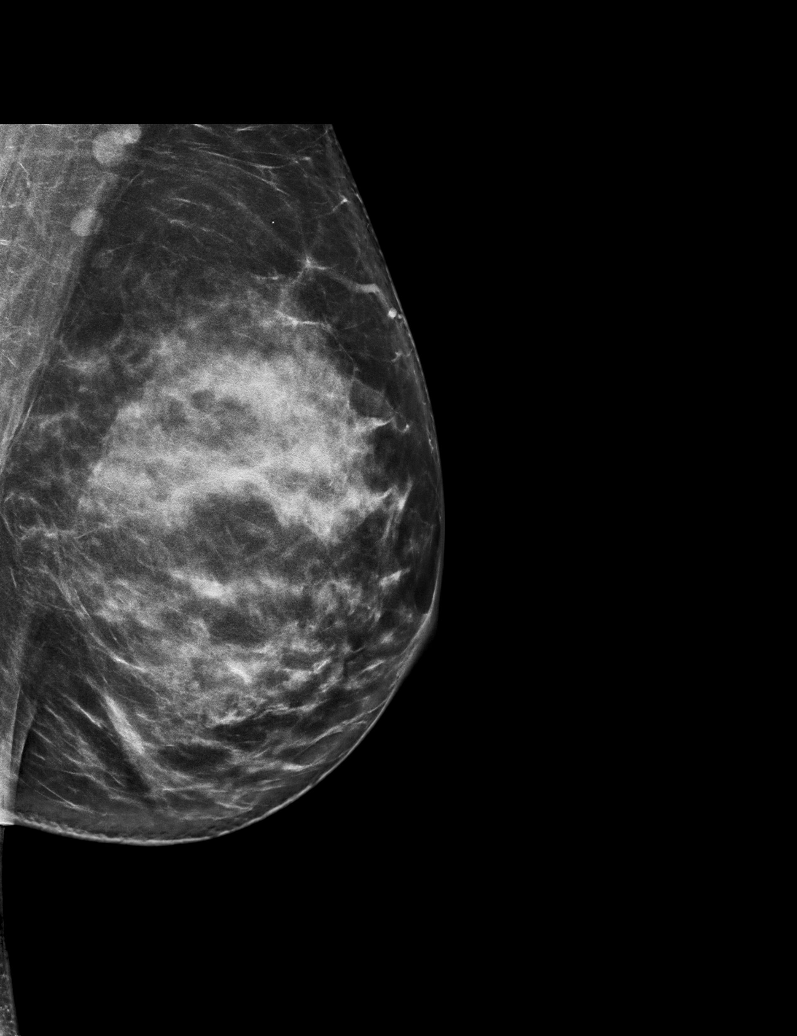

[R CC synth-2D]
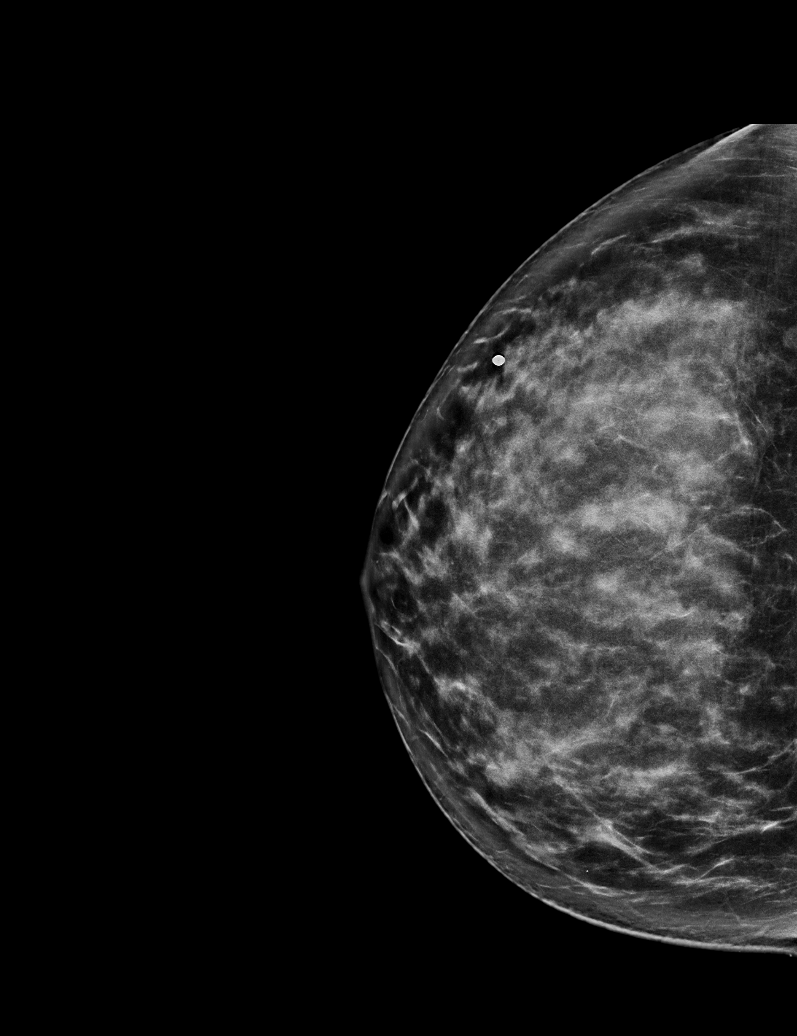

[R CC tomo · tomo slice 37/73.0]
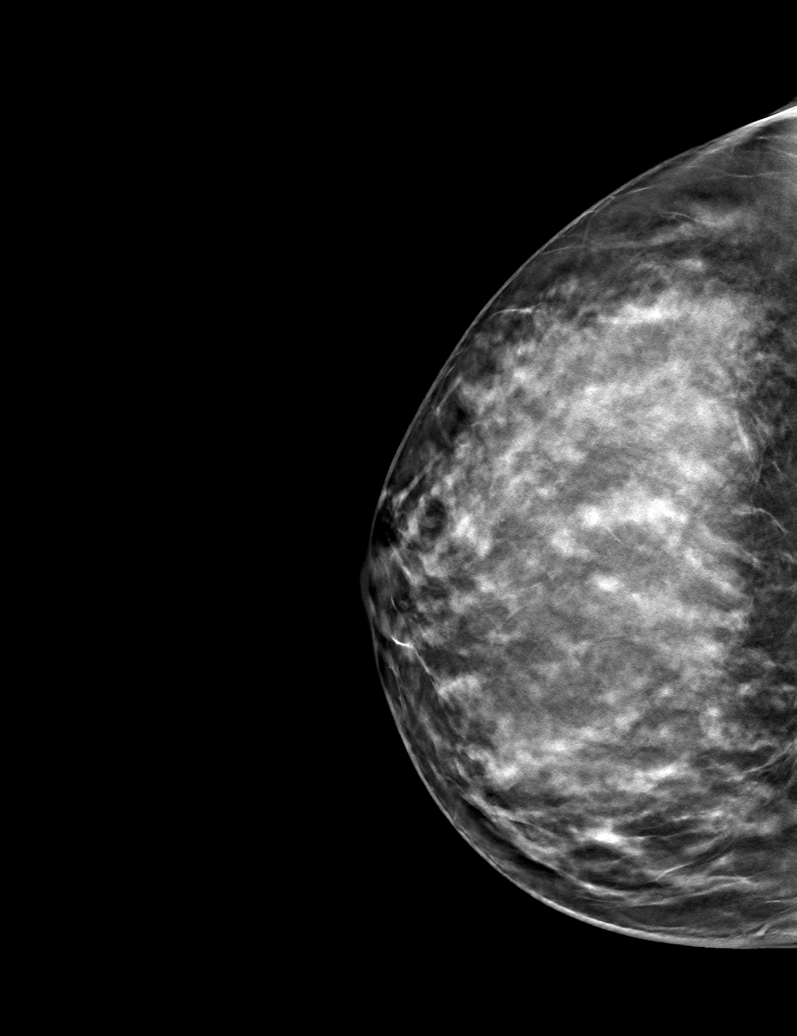

[6 of 30 positions shown; findings below may reference images not displayed]

ACR Breast Density Category d: The breast tissue is extremely dense,
which lowers the sensitivity of mammography.
FINDINGS: A radiopaque BB was placed at the site of the patient's physician
palpated lump in the upper outer right breast at middle depth. No
definite mammographic findings are seen deep to the radiopaque BB.
However, a round, circumscribed equal density mass is seen in the
far posterior upper outer right breast.

Several minimally prominent lymph nodes are noted in the left
axilla. No additional suspicious mammographic findings are
identified in either breast.

Mammographic images were processed with CAD.

On physical exam, I palpate dense fibroglandular tissue in the
upper-outer left breast.

Targeted ultrasound is performed, showing dense fibroglandular
tissue without focal or suspicious sonographic abnormality in the
upper outer quadrant of the left breast at the site of the physician
palpated abnormality. A 2.1 x 1.9 x 1.0 cm cyst is noted in the deep
10 o'clock position 6 cm from the nipple. There is no associated
vascularity with increased through transmission. This correlates
with the mammographic finding.

Precautionary evaluation of the left axilla demonstrates multiple
morphologically normal lymph nodes.
IMPRESSION: 1. No mammographic evidence of malignancy in either breast. No
additional suspicious sonographic findings are identified at the
site of the patient's physician palpated right breast abnormality.
2. Incidental benign right breast simple cyst.

RECOMMENDATION:
1. Clinical follow-up recommended for the palpable area of concern
in the right breast. Any further workup should be based on clinical
grounds.
2. Screening mammogram at age 40 unless there are persistent or
intervening clinical concerns. (Code:W1-0-X18)

I have discussed the findings and recommendations with the patient.
Results were also provided in writing at the conclusion of the
visit. If applicable, a reminder letter will be sent to the patient
regarding the next appointment.

BI-RADS CATEGORY  2: Benign.

## 2020-04-14 IMAGING — US US AXILLARY LEFT
1 series · 4 of 4 positions shown · non-contrast
Comparison: Previous exam(s).

CLINICAL DATA: 32-year-old female with physician palpated right
breast lump.

EXAM:
DIGITAL DIAGNOSTIC BILATERAL MAMMOGRAM WITH CAD AND TOMO
ULTRASOUND BILATERAL BREAST

[Series 1: us axillary left · 0.06mm/px · 4 of 4 slices shown]
[im 1/4]
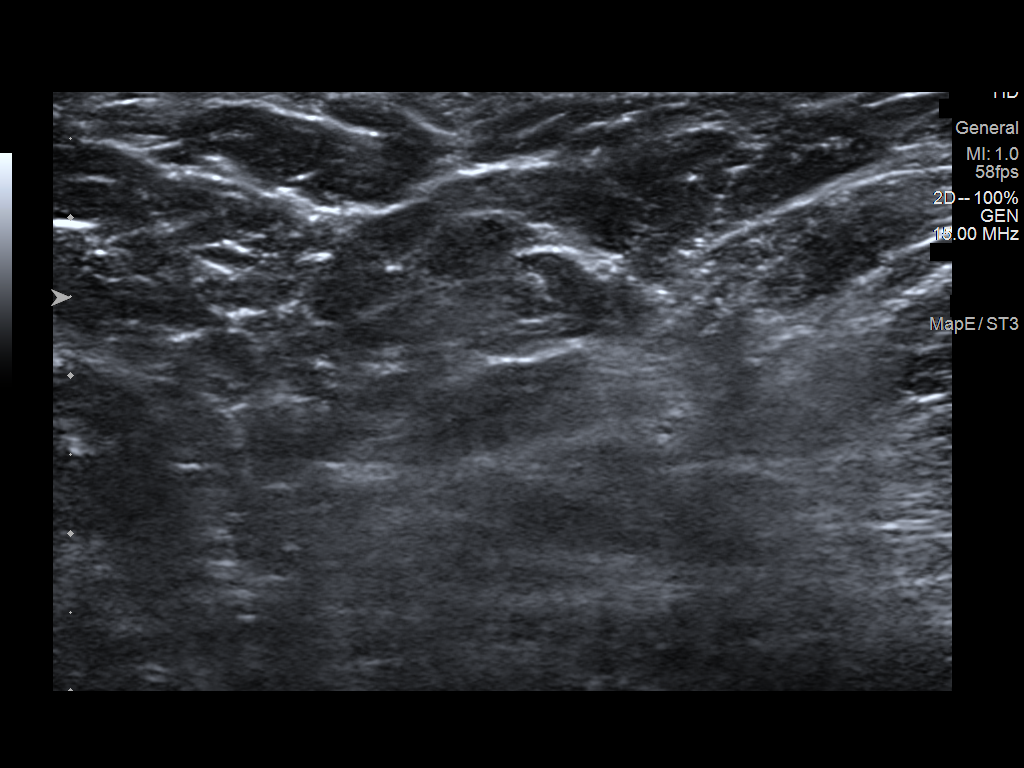
[im 2/4]
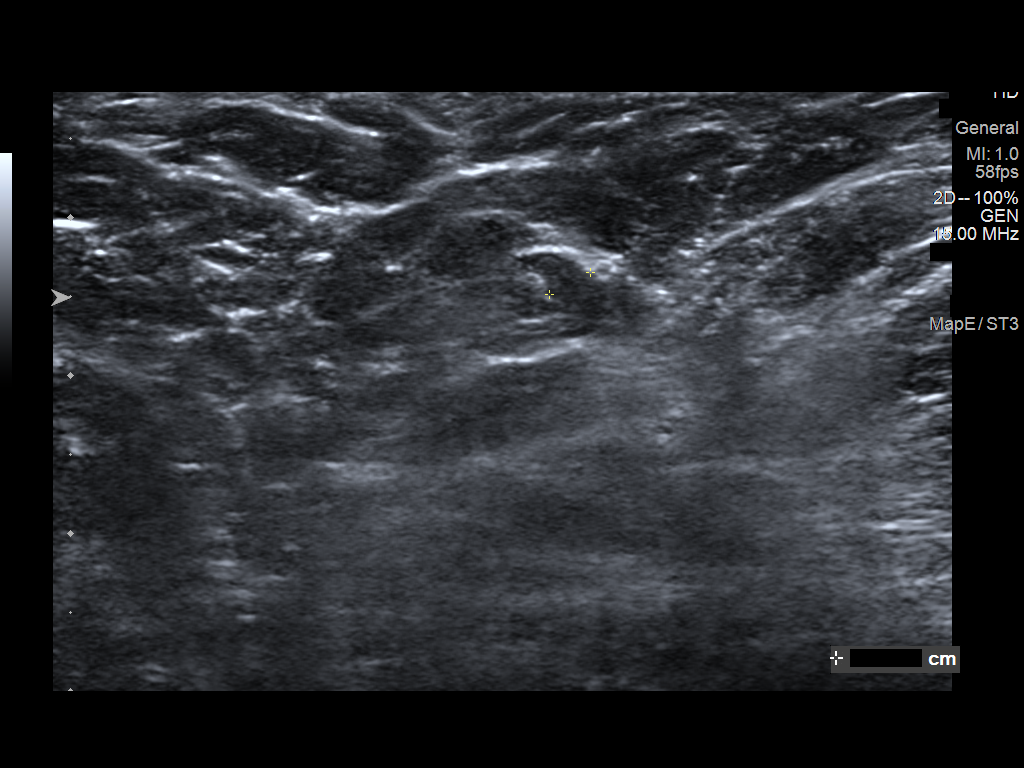
[im 3/4]
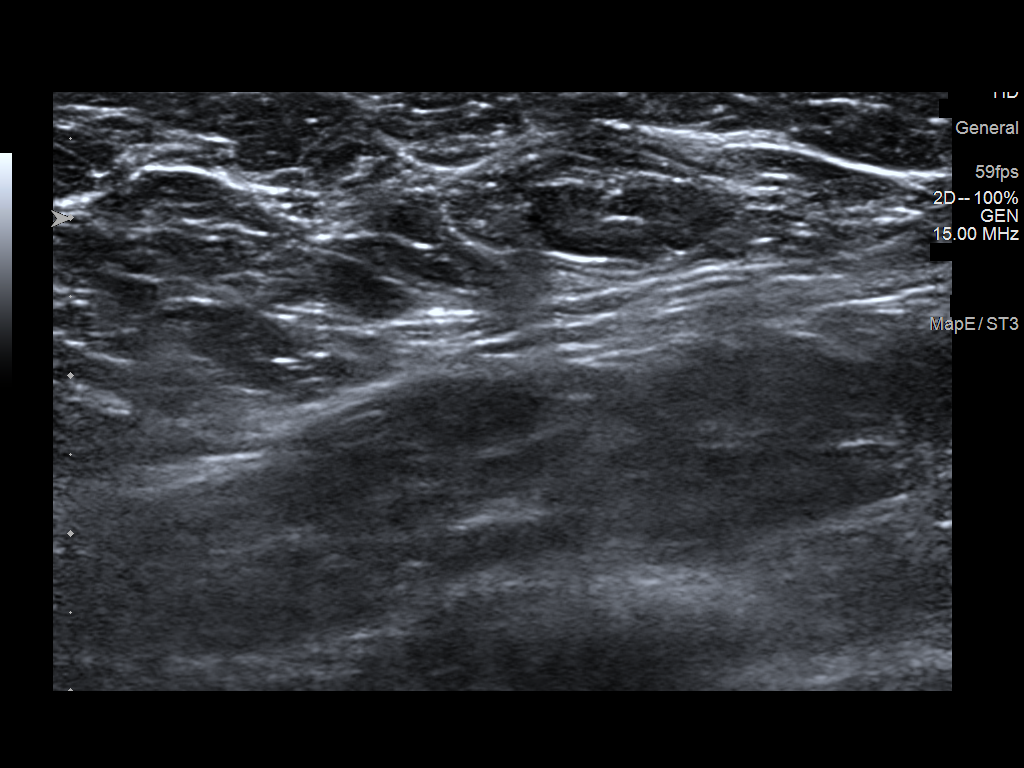
[im 4/4]
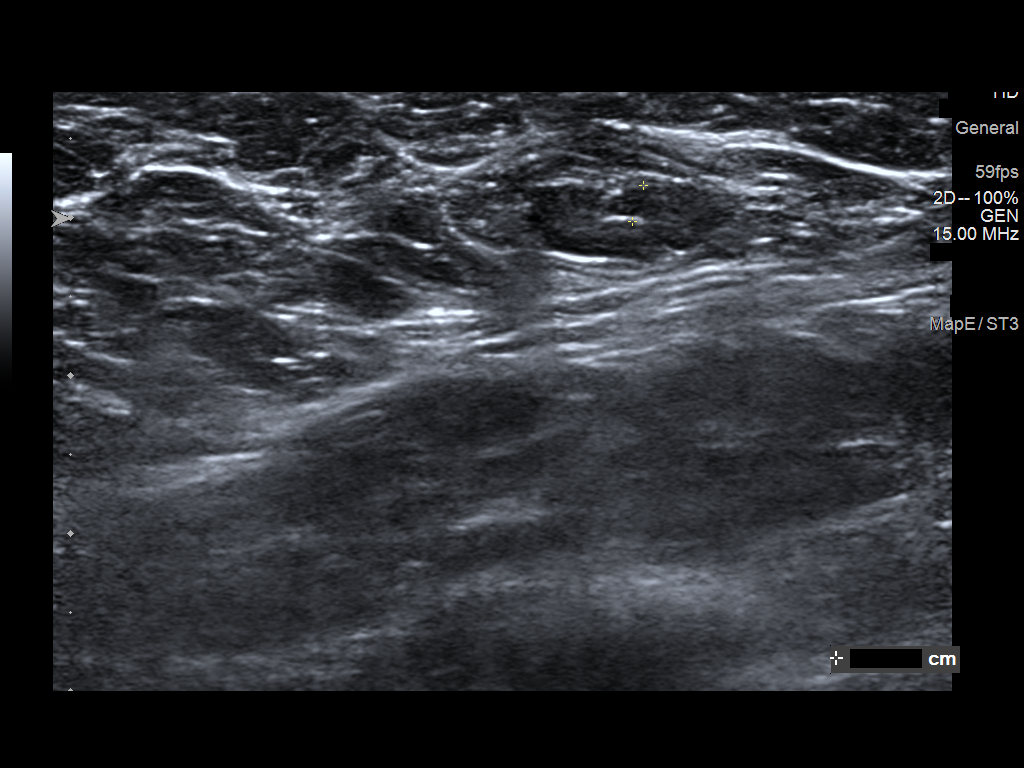

[4 of 4 positions shown; findings below may reference images not displayed]

ACR Breast Density Category d: The breast tissue is extremely dense,
which lowers the sensitivity of mammography.
FINDINGS: A radiopaque BB was placed at the site of the patient's physician
palpated lump in the upper outer right breast at middle depth. No
definite mammographic findings are seen deep to the radiopaque BB.
However, a round, circumscribed equal density mass is seen in the
far posterior upper outer right breast.

Several minimally prominent lymph nodes are noted in the left
axilla. No additional suspicious mammographic findings are
identified in either breast.

Mammographic images were processed with CAD.

On physical exam, I palpate dense fibroglandular tissue in the
upper-outer left breast.

Targeted ultrasound is performed, showing dense fibroglandular
tissue without focal or suspicious sonographic abnormality in the
upper outer quadrant of the left breast at the site of the physician
palpated abnormality. A 2.1 x 1.9 x 1.0 cm cyst is noted in the deep
10 o'clock position 6 cm from the nipple. There is no associated
vascularity with increased through transmission. This correlates
with the mammographic finding.

Precautionary evaluation of the left axilla demonstrates multiple
morphologically normal lymph nodes.
IMPRESSION: 1. No mammographic evidence of malignancy in either breast. No
additional suspicious sonographic findings are identified at the
site of the patient's physician palpated right breast abnormality.
2. Incidental benign right breast simple cyst.

RECOMMENDATION:
1. Clinical follow-up recommended for the palpable area of concern
in the right breast. Any further workup should be based on clinical
grounds.
2. Screening mammogram at age 40 unless there are persistent or
intervening clinical concerns. (Code:[id number])

I have discussed the findings and recommendations with the patient.
Results were also provided in writing at the conclusion of the
visit. If applicable, a reminder letter will be sent to the patient
regarding the next appointment.

BI-RADS CATEGORY  2: Benign.

## 2020-04-16 ENCOUNTER — Encounter (HOSPITAL_COMMUNITY): Payer: Self-pay | Admitting: Surgery

## 2020-04-16 ENCOUNTER — Ambulatory Visit (HOSPITAL_COMMUNITY): Payer: Self-pay | Admitting: Surgery

## 2020-04-16 DIAGNOSIS — K8010 Calculus of gallbladder with chronic cholecystitis without obstruction: Secondary | ICD-10-CM | POA: Diagnosis not present

## 2020-04-16 NOTE — H&P (Signed)
Domingo Sep Appointment: 04/16/2020 9:30 AM Location: [facility] Patient #: [id number] DOB: 10/29/1986 Divorced / Language: [place] / Race: White Female  History of Present Illness Julia Sportsman MD; 04/16/2020 10:10 AM) The patient is a 34 year old female who presents for evaluation of gall stones. Note for "Gall stones": ` ` ` Patient sent for surgical consultation at the request of Cheryll Cockayne, MD  Chief Complaint: Abdominal complaint and gallstones. 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 Had episodes of epigastric pain with nausea and bloating and near vomiting. Triggered by eating. Have been dealing with almonds and occasionally spicy food. She does have a history of heartburn and reflux. This did not seem like that. Over-the-counter pain medicines seem to help some of the attacks but kept recurring. Was concerned. Discussed with her primary care physician. No improvement on Pepcid prescription. Ultrasound done which showed gallstones. Surgical consultation offered.  Patient notes she had a rough month with these attacks. Has not had any issues the past couple of weeks. Trying to focus on a low fat diet. She usually moves her bowels once or twice a day. No episodes of bleeding her irregular bowels. She did no see her bowels were loose during the attacks. Walk several miles without difficulty. She's never had any abdominal surgery. Her mother has been diagnosed with irritable bowel but patient denies any alternating constipation/diarrhea type symptoms. She often gets full after a few bites and bloated. No history of diabetes. On chronic pain meds. She has had some low back pain issues with occasional sciatica but that is stabilized with on with orthopedics. She does not take nonsteroidals. Only occasionally drinks alcohol. No binging. No personal nor family history of GI/colon cancer, inflammatory bowel disease, allergy such as Celiac Sprue,  dietary/dairy problems, colitis, ulcers nor gastritis. No recent sick contacts/gastroenteritis. No travel outside the country. No changes in diet. No dysphagia to solids or liquids. No significant heartburn or reflux. No melena, hematemesis, coffee ground emesis. No evidence of prior gastric/peptic ulceration.  (Review of systems as stated in this history (HPI) or in the review of systems. Otherwise all other 12 point ROS are negative) ` ` `  This patient encounter took 30 minutes today to perform the following: obtain history, perform exam, review outside records, interpret tests & imaging, counsel the patient on their diagnosis; and, document this encounter, including findings & plan in the electronic health record (EHR).   Past Surgical History Ethlyn Gallery, CMA; 04/16/2020 9:39 AM) Oral Surgery Tonsillectomy  Diagnostic Studies History Elease Hashimoto Spillers, CMA; 04/16/2020 9:39 AM) Colonoscopy never Mammogram 1-3 years ago Pap Smear 1-5 years ago  Allergies Elease Hashimoto Spillers, CMA; 04/16/2020 9:40 AM) No Known Drug Allergies [04/16/2020]:  Medication History (Alisha Spillers, CMA; 04/16/2020 9:40 AM) Fluticasone Propionate (50MCG/ACT Suspension, Nasal) Active. Methocarbamol (500MG  Tablet, Oral) Active. Sertraline HCl (100MG  Tablet, Oral) Active. traZODone HCl (50MG  Tablet, Oral) Active. ALPRAZolam (0.25MG  Tablet, Oral) Active. Medications Reconciled  Social History , CMA; 04/16/2020 9:39 AM) Alcohol use Moderate alcohol use. Caffeine use Coffee. No drug use Tobacco use Never smoker.  Family History , CMA; 04/16/2020 9:39 AM) Alcohol Abuse Brother, Family Members In General. Arthritis Mother. Colon Polyps Father, Mother. Depression Brother. Diabetes Mellitus Father. Heart Disease Family Members In General. Hypertension Brother. Ischemic Bowel Disease Mother. Malignant Neoplasm Of Pancreas Family Members In  General. Melanoma Family Members In General.  Pregnancy / Birth History 06/16/2020, CMA; 04/16/2020 9:39 AM) Age at menarche 48  years. Contraceptive History Intrauterine device, Oral contraceptives. Gravida 0 Para 0 Regular periods  Other Problems Elease Hashimoto Spillers, CMA; 04/16/2020 9:39 AM) Anxiety Disorder Back Pain Gastroesophageal Reflux Disease Lump In Breast     Review of Systems (Alisha Spillers CMA; 04/16/2020 9:39 AM) General Not Present- Appetite Loss, Chills, Fatigue, Fever, Night Sweats, Weight Gain and Weight Loss. Skin Not Present- Change in Wart/Mole, Dryness, Hives, Jaundice, New Lesions, Non-Healing Wounds, Rash and Ulcer. HEENT Present- Seasonal Allergies and Wears glasses/contact lenses. Not Present- Earache, Hearing Loss, Hoarseness, Nose Bleed, Oral Ulcers, Ringing in the Ears, Sinus Pain, Sore Throat, Visual Disturbances and Yellow Eyes. Respiratory Not Present- Bloody sputum, Chronic Cough, Difficulty Breathing, Snoring and Wheezing. Breast Not Present- Breast Mass, Breast Pain, Nipple Discharge and Skin Changes. Cardiovascular Not Present- Chest Pain, Difficulty Breathing Lying Down, Leg Cramps, Palpitations, Rapid Heart Rate, Shortness of Breath and Swelling of Extremities. Gastrointestinal Present- Abdominal Pain, Change in Bowel Habits and Nausea. Not Present- Bloating, Bloody Stool, Chronic diarrhea, Constipation, Difficulty Swallowing, Excessive gas, Gets full quickly at meals, Hemorrhoids, Indigestion, Rectal Pain and Vomiting. Female Genitourinary Not Present- Frequency, Nocturia, Painful Urination, Pelvic Pain and Urgency. Musculoskeletal Present- Back Pain and Muscle Pain. Not Present- Joint Pain, Joint Stiffness, Muscle Weakness and Swelling of Extremities. Neurological Not Present- Decreased Memory, Fainting, Headaches, Numbness, Seizures, Tingling, Tremor, Trouble walking and Weakness. Psychiatric Present- Anxiety. Not Present- Bipolar, Change  in Sleep Pattern, Depression, Fearful and Frequent crying. Endocrine Not Present- Cold Intolerance, Excessive Hunger, Hair Changes, Heat Intolerance, Hot flashes and New Diabetes. Hematology Not Present- Blood Thinners, Easy Bruising, Excessive bleeding, Gland problems, HIV and Persistent Infections.  Vitals (Alisha Spillers CMA; 04/16/2020 9:39 AM) 04/16/2020 9:39 AM Weight: 148 lb Height: 62in Body Surface Area: 1.68 m Body Mass Index: 27.07 kg/m  Pulse: 97 (Regular)  BP: 122/82(Sitting, Left Arm, Standard)        Physical Exam Julia Sportsman MD; 04/16/2020 10:07 AM)  General Mental Status-Alert. General Appearance-Not in acute distress, Not Sickly. Orientation-Oriented X3. Hydration-Well hydrated. Voice-Normal.  Integumentary Global Assessment Upon inspection and palpation of skin surfaces of the - Axillae: non-tender, no inflammation or ulceration, no drainage. and Distribution of scalp and body hair is normal. General Characteristics Temperature - normal warmth is noted.  Head and Neck Head-normocephalic, atraumatic with no lesions or palpable masses. Face Global Assessment - atraumatic, no absence of expression. Neck Global Assessment - no abnormal movements, no bruit auscultated on the right, no bruit auscultated on the left, no decreased range of motion, non-tender. Trachea-midline. Thyroid Gland Characteristics - non-tender.  Eye Eyeball - Left-Extraocular movements intact, No Nystagmus - Left. Eyeball - Right-Extraocular movements intact, No Nystagmus - Right. Cornea - Left-No Hazy - Left. Cornea - Right-No Hazy - Right. Sclera/Conjunctiva - Left-No scleral icterus, No Discharge - Left. Sclera/Conjunctiva - Right-No scleral icterus, No Discharge - Right. Pupil - Left-Direct reaction to light normal. Pupil - Right-Direct reaction to light normal.  ENMT Ears Pinna - Left - no drainage observed, no generalized  tenderness observed. Pinna - Right - no drainage observed, no generalized tenderness observed. Nose and Sinuses External Inspection of the Nose - no destructive lesion observed. Inspection of the nares - Left - quiet respiration. Inspection of the nares - Right - quiet respiration. Mouth and Throat Lips - Upper Lip - no fissures observed, no pallor noted. Lower Lip - no fissures observed, no pallor noted. Nasopharynx - no discharge present. Oral Cavity/Oropharynx - Tongue - no dryness observed. Oral Mucosa - no cyanosis  observed. Hypopharynx - no evidence of airway distress observed.  Chest and Lung Exam Inspection Movements - Normal and Symmetrical. Accessory muscles - No use of accessory muscles in breathing. Palpation Palpation of the chest reveals - Non-tender. Auscultation Breath sounds - Normal and Clear.  Cardiovascular Auscultation Rhythm - Regular. Murmurs & Other Heart Sounds - Auscultation of the heart reveals - No Murmurs and No Systolic Clicks.  Abdomen Inspection Inspection of the abdomen reveals - No Visible peristalsis and No Abnormal pulsations. Umbilicus - No Bleeding, No Urine drainage. Palpation/Percussion Palpation and Percussion of the abdomen reveal - Soft, Non Tender, No Rebound tenderness, No Rigidity (guarding) and No Cutaneous hyperesthesia. Note: Abdomen soft. Not severely distended. No diastasis recti. No umbilical or other anterior abdominal wall hernias  Female Genitourinary Sexual Maturity Tanner 5 - Adult hair pattern. Note: No vaginal bleeding nor discharge  Peripheral Vascular Upper Extremity Inspection - Left - No Cyanotic nailbeds - Left, Not Ischemic. Inspection - Right - No Cyanotic nailbeds - Right, Not Ischemic.  Neurologic Neurologic evaluation reveals -normal attention span and ability to concentrate, able to name objects and repeat phrases. Appropriate fund of knowledge , normal sensation and normal coordination. Mental  Status Affect - not angry, not paranoid. Cranial Nerves-Normal Bilaterally. Gait-Normal.  Neuropsychiatric Mental status exam performed with findings of-able to articulate well with normal speech/language, rate, volume and coherence, thought content normal with ability to perform basic computations and apply abstract reasoning and no evidence of hallucinations, delusions, obsessions or homicidal/suicidal ideation.  Musculoskeletal Global Assessment Spine, Ribs and Pelvis - no instability, subluxation or laxity. Right Upper Extremity - no instability, subluxation or laxity.  Lymphatic Head & Neck  General Head & Neck Lymphatics: Bilateral - Description - No Localized lymphadenopathy. Axillary  General Axillary Region: Bilateral - Description - No Localized lymphadenopathy. Femoral & Inguinal  Generalized Femoral & Inguinal Lymphatics: Left - Description - No Localized lymphadenopathy. Right - Description - No Localized lymphadenopathy.    Assessment & Plan Julia Sportsman MD; 04/16/2020 10:11 AM)  CHRONIC CHOLECYSTITIS WITH CALCULUS (K80.10) Impression: Rather classic history for biliary colic and chronic cholecystitis with stones. The risks of the differential diagnosis seems unlikely.  I think she would benefit from cholecystectomy. Laparoscopic. Reasonable to start out with a single site approach. Discuss nonoperative options but they are usually inadequate. We did discuss about seeing gastroenterology, but I think that is low yield. She does have issues with heartburn and reflux. This does not seem like this. She did not have improvement being on a regular antacid medication for a few weeks. She agrees with proceeding with surgery.  She works as a Education officer, community. I cautioned against her trying to run a full office right away after surgery. At least 2 if not 3 weeks. Depends how things go. She is otherwise very healthy and active, so hopefully surgery and recovery will be not too  challenging. We will see.  We will work to coordinate a convenient time.  Current Plans You are being scheduled for surgery- Our schedulers will call you.  You should hear from our office's scheduling department within 5 working days about the location, date, and time of surgery. We try to make accommodations for patient's preferences in scheduling surgery, but sometimes the OR schedule or the surgeon's schedule prevents [place] from making those accommodations.  If you have not heard from our office [phone]) in 5 working days, call the office and ask for your surgeon's nurse.  If you have other questions about  your diagnosis, plan, or surgery, call the office and ask for your surgeon's nurse.  Written instructions provided Pt Education - Pamphlet Given - Laparoscopic Gallbladder Surgery: discussed with patient and provided information. The anatomy & physiology of hepatobiliary & pancreatic function was discussed. The pathophysiology of gallbladder dysfunction was discussed. Natural history risks without surgery was discussed. I feel the risks of no intervention will lead to serious problems that outweigh the operative risks; therefore, I recommended cholecystectomy to remove the pathology. I explained laparoscopic techniques with possible need for an open approach. Probable cholangiogram to evaluate the bilary tract was explained as well.  Risks such as bleeding, infection, abscess, leak, injury to other organs, need for further treatment, heart attack, death, and other risks were discussed. I noted a good likelihood this will help address the problem. Possibility that this will not correct all abdominal symptoms was explained. Goals of post-operative recovery were discussed as well. We will work to minimize complications. An educational handout further explaining the pathology and treatment options was given as well. Questions were answered. The patient expresses understanding &  wishes to proceed with surgery.  Pt Education - [facility] Laparosopic Post Op HCI (Moncerrath Berhe) Pt Education - [facility] Good Bowel Health (Constantine Ruddick) Pt Education - Laparoscopic Cholecystectomy: gallbladder  Adin Hector, MD, FACS, MASCRS Gastrointestinal and Minimally Invasive Surgery  [facility] [address] [place], [place] [address] [phone] Fax [phone] Main/Paging  CONTACT INFORMATION: Weekday (9AM-5PM) concerns: Call [facility] main office at [phone] Weeknight (5PM-9AM) or Weekend/Holiday concerns: Check [email or web address] for General Surgery [facility] coverage (Please, do not use SecureChat as it is not reliable communication to operating surgeons for immediate patient care)

## 2020-05-29 ENCOUNTER — Other Ambulatory Visit: Payer: Self-pay | Admitting: Surgery

## 2020-05-29 DIAGNOSIS — K811 Chronic cholecystitis: Secondary | ICD-10-CM | POA: Diagnosis not present

## 2020-06-05 ENCOUNTER — Other Ambulatory Visit: Payer: Self-pay | Admitting: Internal Medicine

## 2020-06-09 ENCOUNTER — Other Ambulatory Visit: Payer: Self-pay | Admitting: Internal Medicine

## 2020-08-02 DIAGNOSIS — H52223 Regular astigmatism, bilateral: Secondary | ICD-10-CM | POA: Diagnosis not present

## 2020-08-02 DIAGNOSIS — H5213 Myopia, bilateral: Secondary | ICD-10-CM | POA: Diagnosis not present

## 2020-09-04 ENCOUNTER — Telehealth: Payer: Self-pay | Admitting: *Deleted

## 2020-09-04 NOTE — Telephone Encounter (Signed)
Left patient a message to call and schedule appointment for 12/03 or 10/25/2020 at her request.

## 2020-09-10 ENCOUNTER — Other Ambulatory Visit: Payer: Self-pay | Admitting: Internal Medicine

## 2020-09-18 ENCOUNTER — Other Ambulatory Visit: Payer: Self-pay | Admitting: Orthopaedic Surgery

## 2020-09-18 DIAGNOSIS — M542 Cervicalgia: Secondary | ICD-10-CM

## 2020-10-02 ENCOUNTER — Ambulatory Visit: Payer: BC Managed Care – PPO | Admitting: Internal Medicine

## 2020-10-11 ENCOUNTER — Encounter: Payer: Self-pay | Admitting: Certified Nurse Midwife

## 2020-10-11 ENCOUNTER — Other Ambulatory Visit: Payer: Self-pay

## 2020-10-11 ENCOUNTER — Ambulatory Visit: Payer: BC Managed Care – PPO | Admitting: Certified Nurse Midwife

## 2020-10-11 VITALS — BP 117/72 | HR 75 | Resp 16 | Ht 62.0 in | Wt 150.0 lb

## 2020-10-11 DIAGNOSIS — Z30432 Encounter for removal of intrauterine contraceptive device: Secondary | ICD-10-CM | POA: Diagnosis not present

## 2020-10-11 DIAGNOSIS — Z3169 Encounter for other general counseling and advice on procreation: Secondary | ICD-10-CM

## 2020-10-11 NOTE — Progress Notes (Signed)
   GYNECOLOGY OFFICE VISIT NOTE  History:  34 y.o. G0P0000 here today for IUD removal. She is getting married in 1 week and would like to become pregnant. She denies any abnormal vaginal discharge, bleeding, pelvic pain. She is also concerned about a cyst in her right breast that was found a few years ago. Mammogram was benign. She can feel it at times. No other concerns.  Past Medical History:  Diagnosis Date  . Allergic rhinitis   . Anemia    in high school  . Anxiety   . Elevated blood pressure reading without diagnosis of hypertension    in context of Adderall initiation  . History of chicken pox   . Infertility, female   . Ovarian cyst, right 2014    Past Surgical History:  Procedure Laterality Date  . TONSILLECTOMY AND ADENOIDECTOMY    . WISDOM TOOTH EXTRACTION      The following portions of the patient's history were reviewed and updated as appropriate: allergies, current medications, past family history, past medical history, past social history, past surgical history and problem list.   Health Maintenance:  Normal pap and negative HRHPV on 2019.    Review of Systems:  Negative except noted in HPI Genito-Urinary ROS: negative   Objective:  Physical Exam BP 117/72   Pulse 75   Resp 16   Ht 5\' 2"  (1.575 m)   Wt 150 lb (68 kg)   BMI 27.44 kg/m  CONSTITUTIONAL: Well-developed, well-nourished female in no acute distress.  HENT:  Normocephalic, atraumatic EYES: Conjunctivae and EOM are normal NECK: Normal range of motion SKIN: Skin is warm and dry NEUROLOGIC: Alert and oriented to person, place, and time PSYCHIATRIC: Normal mood and affect CARDIOVASCULAR: Normal heart rate noted Breasts: breasts appear normal, no suspicious masses, no skin or nipple changes or axillary nodes. RESPIRATORY: Effort and rate normal ABDOMEN: Soft, no distention  PELVIC: Normal appearing external genitalia; normal appearing vaginal mucosa and cervix.  No abnormal discharge noted.     MUSCULOSKELETAL: Normal range of motion  Procedure Note: Pt consented for IUD removal. Pt placed in lithotomy position. Speculum inserted, cervix visualized. Strings not seen therefore IUD hook used to bring string out of cervical canal. Strings grasped with long kelly and IUD removed intact without difficulty. Tolerated well.  Assessment & Plan:   1. Encounter for IUD removal   2. Pre-conception counseling   - return to regular menses varies - start PNV daily  Follow up for annual or prn  Total face-to-face time with patient: 15 minutes   , Donette Larry 10/11/2020 10:32 AM

## 2020-11-20 DIAGNOSIS — M545 Low back pain, unspecified: Secondary | ICD-10-CM | POA: Diagnosis not present

## 2020-11-21 ENCOUNTER — Encounter: Payer: Self-pay | Admitting: Internal Medicine

## 2020-11-28 NOTE — Patient Instructions (Addendum)
  Medications changes include :    none  Your prescription(s) have been submitted to your pharmacy. Please take as directed and contact our office if you believe you are having problem(s) with the medication(s).   A referral was ordered for [facility]	[place].       Someone from their office will call you to schedule an appointment.    Please followup in the fall

## 2020-11-28 NOTE — Progress Notes (Signed)
Subjective:    Patient ID: Julia Jimenez, female    DOB: 1986/02/10, 35 y.o.   MRN: [id number]  HPI The patient is here for follow up of their chronic medical problems.  She was pregnant and thinks she just miscarried.  She had a home pregnancy test that was positive and passed a blood clot and bled.  She later had a home test that was negative.    Poor sleep, anxiety, depression:  She is taking all of her medications as prescribed. When she thought she was pregnant she did get off of the trazodone, but admits it was very difficult for her to sleep. She did take Unisom which she notes is safe during pregnancy and that did help some. Since having the miscarriage she is back on trazodone. She would like to get off of it over the next few weeks.  She has had a cough for more than 7 days. She has some other mild cold symptoms. She has been tested for COVID and it is negative.    Medications and allergies reviewed with patient and updated if appropriate.  Patient Active Problem List   Diagnosis Date Noted  . Epigastric cramping 03/23/2020  . Lumbar radiculopathy 03/23/2020  . Eustachian tube dysfunction, right 12/01/2019  . Chronic maxillary sinusitis 11/10/2018  . Depression 09/10/2018  . Sleep difficulties 09/10/2018  . Cervical lymphadenopathy 09/24/2017  . Family history of diabetes mellitus 09/24/2017  . Skin lumps 08/13/2017  . Bursitis of shoulder, left 05/04/2017  . Anxiety 09/22/2015  . 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 ALPRAZolam (XANAX) 0.5 MG tablet Take 0.5-1 tablets (0.25-0.5 mg total) by mouth at bedtime as needed for anxiety. 30 tablet 0  . methocarbamol (ROBAXIN) 500 MG tablet Take 1 tablet (500 mg total) by mouth every 6 (six) hours as needed for muscle spasms. 60 tablet 1  . sertraline (ZOLOFT) 100 MG tablet TAKE 1 TABLET(100 MG) BY MOUTH DAILY 90 tablet 1  .  traZODone (DESYREL) 50 MG tablet TAKE 1/2 TO 1 TABLET BY MOUTH AT BEDTIME AS NEEDED FOR SLEEP 30 tablet 5   No current facility-administered medications on file prior to visit.    Past Medical History:  Diagnosis Date  . Allergic rhinitis   . Anemia    in high school  . Anxiety   . Elevated blood pressure reading without diagnosis of hypertension    in context of Adderall initiation  . History of chicken pox   . Infertility, female   . Ovarian cyst, right 2014    Past Surgical History:  Procedure Laterality Date  . TONSILLECTOMY AND ADENOIDECTOMY    . WISDOM TOOTH EXTRACTION      Social History   Socioeconomic History  . Marital status: Significant Other    Spouse name: Not on file  . Number of children: Not on file  . Years of education: Not on file  . Highest education level: Professional school degree (e.g., MD, DDS, DVM, JD)  Occupational History  . Occupation: Education officer, community  Tobacco Use  . Smoking status: Never Smoker  . Smokeless tobacco: Never Used  Vaping Use  . Vaping Use: Never used  Substance and Sexual Activity  . Alcohol use: Yes    Alcohol/week: 2.0 standard drinks    Types: 2 Standard drinks or equivalent per week    Comment: occasionally  . Drug use: No  . Sexual activity: Yes  Partners: Male    Birth control/protection: I.U.D.    Comment: Mirena IUD inserted 06-21-13  Other Topics Concern  . Not on file  Social History Narrative   Married    2 people living in household   Employed    Highest education level DDS   Occupation Dentist   7-8 hours of sleep a night    Social Determinants of Health   Financial Resource Strain: Not on file  Food Insecurity: Not on file  Transportation Needs: Not on file  Physical Activity: Not on file  Stress: Not on file  Social Connections: Not on file    Family History  Problem Relation Age of Onset  . Hypertension Mother   . Depression Mother   . Anxiety disorder Mother   . Diabetes Father   .  Hypertension Brother   . Depression Brother   . Diabetes Maternal Aunt   . Stroke Maternal Uncle   . Diabetes Maternal Uncle        x2  . Diabetes Paternal Grandmother   . Heart attack Paternal Grandmother   . Cancer Maternal Grandfather        Dec CA of unknown origin  . Hepatitis C Maternal Grandmother        Dec    Review of Systems  Constitutional: Negative for fever.  HENT: Positive for congestion and postnasal drip. Negative for sinus pain.   Respiratory: Positive for cough and shortness of breath (a little). Negative for wheezing.   Cardiovascular: Negative for chest pain, palpitations and leg swelling.  Neurological: Positive for headaches. Negative for light-headedness.       Objective:   Vitals:   11/29/20 1027  BP: 116/74  Pulse: 70  Temp: 98.2 F (36.8 C)  SpO2: 98%   BP Readings from Last 3 Encounters:  11/29/20 116/74  10/11/20 117/72  03/22/20 124/78   Wt Readings from Last 3 Encounters:  11/29/20 149 lb 6.4 oz (67.8 kg)  10/11/20 150 lb (68 kg)  03/22/20 146 lb (66.2 kg)   Body mass index is 27.33 kg/m.   Depression screen [facility] 2/9 11/29/2020 09/09/2018  Decreased Interest 0 0  Down, Depressed, Hopeless 0 0  PHQ - 2 Score 0 0  Altered sleeping 1 2  Tired, decreased energy 1 1  Change in appetite 0 0  Feeling bad or failure about yourself  0 1  Trouble concentrating 1 1  Moving slowly or fidgety/restless 0 0  Suicidal thoughts 0 0  PHQ-9 Score 3 5  Difficult doing work/chores Not difficult at all -      Physical Exam    Constitutional: Appears well-developed and well-nourished. No distress.  HENT:  Head: Normocephalic and atraumatic.  Neck: Neck supple. No tracheal deviation present. No thyromegaly present.  No cervical lymphadenopathy Cardiovascular: Normal rate, regular rhythm and normal heart sounds.   No murmur heard. No carotid bruit .  No edema Pulmonary/Chest: Effort normal and breath sounds normal. No respiratory distress. No  has no wheezes. No rales.  Skin: Skin is warm and dry. Not diaphoretic.  Psychiatric: Normal mood and affect. Behavior is normal.      Assessment & Plan:    See Problem List for Assessment and Plan of chronic medical problems.    This visit occurred during the SARS-CoV-2 public health emergency.  Safety protocols were in place, including screening questions prior to the visit, additional usage of staff PPE, and extensive cleaning of exam room while observing appropriate contact time as  indicated for disinfecting solutions.    

## 2020-11-29 ENCOUNTER — Ambulatory Visit: Payer: BC Managed Care – PPO | Admitting: Internal Medicine

## 2020-11-29 ENCOUNTER — Encounter: Payer: Self-pay | Admitting: Internal Medicine

## 2020-11-29 ENCOUNTER — Other Ambulatory Visit: Payer: Self-pay

## 2020-11-29 VITALS — BP 116/74 | HR 70 | Temp 98.2°F | Ht 62.0 in | Wt 149.4 lb

## 2020-11-29 DIAGNOSIS — F419 Anxiety disorder, unspecified: Secondary | ICD-10-CM | POA: Diagnosis not present

## 2020-11-29 DIAGNOSIS — G479 Sleep disorder, unspecified: Secondary | ICD-10-CM | POA: Diagnosis not present

## 2020-11-29 DIAGNOSIS — J069 Acute upper respiratory infection, unspecified: Secondary | ICD-10-CM

## 2020-11-29 DIAGNOSIS — G8929 Other chronic pain: Secondary | ICD-10-CM

## 2020-11-29 DIAGNOSIS — M545 Low back pain, unspecified: Secondary | ICD-10-CM | POA: Diagnosis not present

## 2020-11-29 DIAGNOSIS — F3289 Other specified depressive episodes: Secondary | ICD-10-CM

## 2020-11-29 MED ORDER — SERTRALINE HCL 100 MG PO TABS: ORAL_TABLET | ORAL | 1 refills | Status: DC

## 2020-11-29 NOTE — Assessment & Plan Note (Signed)
Chronic, intermittent Has lower back pain and intermittent spasms Continue methocarbamol 500 mg daily as needed for spasms She is limited how far she can walk because of her back pain She has done physical therapy and it helps Will refer to sports medicine for further evaluation-Karl Fields

## 2020-11-29 NOTE — Assessment & Plan Note (Signed)
Acute Likely viral in nature Continue symptomatic treatment She will call if her symptoms do not improve

## 2020-11-29 NOTE — Assessment & Plan Note (Signed)
Chronic Taking trazodone 50 mg daily and that has worked very well for her She would like to come off of this because she does feel dependent on it and would like to get pregnant sometime this year Can stop cold [place] or can try to taper off slowly Can use Unisom if needed initially Continue good sleep hygiene which he does I do think that she can go without medication but discussed that she will go through time. Where it would be difficult to sleep

## 2020-11-29 NOTE — Assessment & Plan Note (Addendum)
Chronic Depression screening-PHQ-9 done today with a score of 3. Most of her positives are regarding sleeping and feeling tired Controlled, stable Continue sertraline 100 mg daily

## 2020-11-29 NOTE — Assessment & Plan Note (Addendum)
Chronic GAD-7 score is a 3. She admits she does worry a lot Overall she feels her anxiety is controlled and stable and she is happy with her medications Continue sertraline 100 mg daily and alprazolam 0.25-0.5 mg daily as needed

## 2020-12-02 DIAGNOSIS — M545 Low back pain, unspecified: Secondary | ICD-10-CM | POA: Diagnosis not present

## 2020-12-06 ENCOUNTER — Other Ambulatory Visit: Payer: Self-pay

## 2020-12-06 ENCOUNTER — Encounter: Payer: Self-pay | Admitting: Family Medicine

## 2020-12-06 ENCOUNTER — Ambulatory Visit: Payer: BC Managed Care – PPO | Admitting: Family Medicine

## 2020-12-06 VITALS — BP 110/78 | Ht 62.0 in | Wt 146.0 lb

## 2020-12-06 DIAGNOSIS — M545 Low back pain, unspecified: Secondary | ICD-10-CM | POA: Diagnosis not present

## 2020-12-06 DIAGNOSIS — G8929 Other chronic pain: Secondary | ICD-10-CM | POA: Diagnosis not present

## 2020-12-06 NOTE — Progress Notes (Signed)
[facility] Attending Note: I have seen and examined this patient. I have discussed this patient with the resident and reviewed the assessment and plan as documented above. I agree with the resident's findings and plan. Musculoskeletal back pain.  I am not concerned for disc pathology.  She has very tight muscles and is inflexible.  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  I would focus on active rehab of back and hamstrings and we have given her home exercise program.  We discussed other options such as deep tissue massage, acupressure etc.  I did give her the name of a therapist who is experienced in both of these who also has experience with massage and pregnancy as she is planning on starting a family in the near future.  Follow-up 4 to 6 weeks.

## 2020-12-06 NOTE — Progress Notes (Signed)
    SUBJECTIVE:   CHIEF COMPLAINT / HPI: Low back pain   Julia Jimenez is an otherwise healthy 35 year old female presenting for evaluation of acute on chronic low back pain.  She reports she initially experienced low back pain, predominantly right sided, approximately 1.5 years ago insidiously.  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  She reports that as constant aching and "feeling tight" mainly when she is walking and standing for long periods of time.  She is already started physical therapy for the past several weeks however does not feel that it is helping very much, doing some stretches in their office once weekly.  She gets deep tissue massages every 3 weeks that does substantially improve her discomfort for about 1-2 days afterwards (then returns).   She ultimately decided to seek further evaluation after noting worsening left-sided low back pain for the past few weeks.  She reports an occasional tingling/different feeling that will go down her left leg on the side and posterior aspect, mainly at night.  States it almost feels like restless leg syndrome and that she has to move her leg.  If she moves a certain way she will feel a sharp pain in her left back.  Denies any preceding injury or trauma, overt weakness, numbness, bowel/bladder troubles.  She does Orange theory approximately 3 times weekly and additional strengthening exercises at home.  PERTINENT  PMH / PSH: Chronic low back pain, previous left shoulder bursitis, anxiety  OBJECTIVE:   BP 110/78   Ht 5\' 2"  (1.575 m)   Wt 146 lb (66.2 kg)   BMI 26.70 kg/m   General: Alert, NAD HEENT: NCAT, MMM Lungs: No increased WOB   Lumbar spine: - Inspection: no gross deformity or asymmetry, swelling or ecchymosis - Palpation: TTP over bilateral lumbar paraspinal musculature, L>R.  No SI joint tenderness.  Tender to palpation of bilateral piriformis.  Musculature throughout low back is  tense. - ROM: Limited active ROM of the lumbar spine in extension, full active ROM in flexion - Strength: 5/5 strength of lower extremity in L4-S1 nerve root distributions b/l; normal gait, pain in low back pain elicted with hip extension and knee flexion on the left.  - Neuro: sensation intact in the L4-S1 nerve root distribution b/l, 2+ L4 and S1 reflexes - Special testing: Negative straight leg raise  ASSESSMENT/PLAN:   Chronic low back pain without sciatica Suspect long-term positioning with her job has led to significant muscular balances throughout her low back/hamstrings.  While she may have irritated a nerve due to her muscular tension, less concern for herniated disc/lumbar radiculopathy given presentation.  Set expectations with her that given the duration and extent, it may take quite some time to see improvement.  Recommended increasing deep tissue massage to weekly, provided massage therapist to contact.  Additionally provided strengthening exercises/stretches, does not to continue with formal PT as she is had minimal benefit.  Limit painful activities.  Ice/heat, analgesics as needed.    Follow-up in approximately 4 to 6 weeks or sooner if needed, especially development of any weakness or numbness.  , DO [facility]

## 2020-12-06 NOTE — Assessment & Plan Note (Addendum)
Suspect long-term positioning with her job has led to significant muscular balances throughout her low back/hamstrings.  While she may have irritated a nerve due to her muscular tension, less concern for herniated disc/lumbar radiculopathy given presentation.  Set expectations with her that given the duration and extent, it may take quite some time to see improvement.  Recommended increasing deep tissue massage to weekly, provided massage therapist to contact.  Additionally provided strengthening exercises/stretches, does not to continue with formal PT as she is had minimal benefit.  Limit painful activities.  Ice/heat, analgesics as needed.

## 2020-12-09 DIAGNOSIS — M545 Low back pain, unspecified: Secondary | ICD-10-CM | POA: Diagnosis not present

## 2020-12-12 ENCOUNTER — Other Ambulatory Visit: Payer: Self-pay | Admitting: Internal Medicine

## 2020-12-16 DIAGNOSIS — M545 Low back pain, unspecified: Secondary | ICD-10-CM | POA: Diagnosis not present

## 2020-12-23 DIAGNOSIS — M545 Low back pain, unspecified: Secondary | ICD-10-CM | POA: Diagnosis not present

## 2021-01-03 ENCOUNTER — Ambulatory Visit: Payer: BC Managed Care – PPO | Admitting: Family Medicine

## 2021-01-06 DIAGNOSIS — M545 Low back pain, unspecified: Secondary | ICD-10-CM | POA: Diagnosis not present

## 2021-01-10 ENCOUNTER — Encounter (HOSPITAL_COMMUNITY): Payer: Self-pay | Admitting: Obstetrics & Gynecology

## 2021-01-10 ENCOUNTER — Telehealth: Payer: Self-pay | Admitting: *Deleted

## 2021-01-10 ENCOUNTER — Inpatient Hospital Stay (HOSPITAL_COMMUNITY)
Admission: AD | Admit: 2021-01-10 | Discharge: 2021-01-10 | Disposition: A | Discharge status: Home / Self Care | Payer: BC Managed Care – PPO | Attending: Obstetrics & Gynecology | Admitting: Obstetrics & Gynecology

## 2021-01-10 ENCOUNTER — Other Ambulatory Visit: Payer: Self-pay

## 2021-01-10 ENCOUNTER — Inpatient Hospital Stay (HOSPITAL_COMMUNITY): Payer: BC Managed Care – PPO

## 2021-01-10 DIAGNOSIS — Z3A00 Weeks of gestation of pregnancy not specified: Secondary | ICD-10-CM | POA: Diagnosis not present

## 2021-01-10 DIAGNOSIS — O4691 Antepartum hemorrhage, unspecified, first trimester: Secondary | ICD-10-CM | POA: Diagnosis not present

## 2021-01-10 DIAGNOSIS — Z3A01 Less than 8 weeks gestation of pregnancy: Secondary | ICD-10-CM | POA: Insufficient documentation

## 2021-01-10 DIAGNOSIS — Z79899 Other long term (current) drug therapy: Secondary | ICD-10-CM | POA: Insufficient documentation

## 2021-01-10 DIAGNOSIS — O209 Hemorrhage in early pregnancy, unspecified: Secondary | ICD-10-CM | POA: Insufficient documentation

## 2021-01-10 DIAGNOSIS — O09521 Supervision of elderly multigravida, first trimester: Secondary | ICD-10-CM | POA: Diagnosis not present

## 2021-01-10 DIAGNOSIS — Z8759 Personal history of other complications of pregnancy, childbirth and the puerperium: Secondary | ICD-10-CM | POA: Insufficient documentation

## 2021-01-10 DIAGNOSIS — O3680X0 Pregnancy with inconclusive fetal viability, not applicable or unspecified: Secondary | ICD-10-CM | POA: Diagnosis not present

## 2021-01-10 LAB — CBC
HCT: 39.1 % (ref 36.0–46.0)
Hemoglobin: 13.6 g/dL (ref 12.0–15.0)
MCH: 29.3 pg (ref 26.0–34.0)
MCHC: 34.8 g/dL (ref 30.0–36.0)
MCV: 84.3 fL (ref 80.0–100.0)
Platelets: 195 10*3/uL (ref 150–400)
RBC: 4.64 MIL/uL (ref 3.87–5.11)
RDW: 12.5 % (ref 11.5–15.5)
WBC: 4.7 10*3/uL (ref 4.0–10.5)
nRBC: 0 % (ref 0.0–0.2)

## 2021-01-10 LAB — HCG, QUANTITATIVE, PREGNANCY: hCG, Beta Chain, Quant, S: 161 m[IU]/mL — ABNORMAL HIGH (ref <5)

## 2021-01-10 LAB — URINALYSIS, ROUTINE W REFLEX MICROSCOPIC
Bilirubin Urine: NEGATIVE
Glucose, UA: NEGATIVE mg/dL
Ketones, ur: NEGATIVE mg/dL
Leukocytes,Ua: NEGATIVE
Nitrite: NEGATIVE
Protein, ur: NEGATIVE mg/dL
Specific Gravity, Urine: 1.003 — ABNORMAL LOW (ref 1.005–1.030)
pH: 7 (ref 5.0–8.0)

## 2021-01-10 LAB — POCT PREGNANCY, URINE: Preg Test, Ur: POSITIVE — AB

## 2021-01-10 LAB — ABO/RH: ABO/RH(D): A POS

## 2021-01-10 NOTE — Telephone Encounter (Signed)
Patient states that she is having bleeding with clots. New OB appointment scheduled for 01/23/2021 at [facility]. Patient advised to go to MAU for evaluation due to early pregnancy. Patient agreed to treatmaent plan.

## 2021-01-10 NOTE — MAU Note (Signed)
Presents with c/o spotting and passed 2 nickel sized clots this morning. Reports feels like she's going to start menstrual cycle, feeling "crampy".  LMP 11/26/2020. +HPT.

## 2021-01-10 NOTE — MAU Provider Note (Addendum)
History     CSN: [id number]  Arrival date and time: 01/10/21 1037   Event Date/Time   First Provider Initiated Contact with Patient 01/10/21 1136      Chief Complaint  Patient presents with   Vaginal Bleeding   HPI Julia Jimenez is a 35 y.o. G2P0010 at [redacted]w[redacted]d who presents with vaginal bleeding. Symptoms started last night. Initially was bright red spotting. This morning passed 2 nickel sized clots. Since then has continued to see dark red spotting. Only sees on toilet paper; not saturating or using pads. Denies abdominal pain but does endorse abdominal bloating. Had intercourse earlier this week. Reports miscarriage in mid January with negative pregnancy test on 1/18.    OB History     Gravida  2   Para  0   Term  0   Preterm  0   AB  1   Living  0      SAB  1   IAB  0   Ectopic  0   Multiple  0   Live Births              Past Medical History:  Diagnosis Date   Allergic rhinitis    Anemia    in high school   Anxiety    Elevated blood pressure reading without diagnosis of hypertension    in context of Adderall initiation   History of chicken pox    Infertility, female    Ovarian cyst, right 2014    Past Surgical History:  Procedure Laterality Date   CHOLECYSTECTOMY, LAPAROSCOPIC     TONSILLECTOMY AND ADENOIDECTOMY     WISDOM TOOTH EXTRACTION      Family History  Problem Relation Age of Onset   Hypertension Mother    Depression Mother    Anxiety disorder Mother    Diabetes Father    Hypertension Brother    Depression Brother    Diabetes Maternal Aunt    Stroke Maternal Uncle    Diabetes Maternal Uncle        x2   Diabetes Paternal Grandmother    Heart attack Paternal Grandmother    Cancer Maternal Grandfather        Dec CA of unknown origin   Hepatitis C Maternal Grandmother        Dec    Social History   Tobacco Use   Smoking status: Never Smoker   Smokeless tobacco: Never Used  Building services engineer Use: Never used   Substance Use Topics   Alcohol use: Not Currently    Alcohol/week: 2.0 standard drinks    Types: 2 Standard drinks or equivalent per week    Comment: occasionally   Drug use: No    Allergies: No Known Allergies  Medications Prior to Admission  Medication Sig Dispense Refill Last Dose   doxylamine, Sleep, (UNISOM) 25 MG tablet Take 25 mg by mouth at bedtime as needed.   01/09/2021 at Unknown time   Prenatal Vit-Fe Fumarate-FA (PRENATAL MULTIVITAMIN) TABS tablet Take 1 tablet by mouth daily at 12 noon.   01/09/2021 at Unknown time   sertraline (ZOLOFT) 100 MG tablet TAKE 1 TABLET(100 MG) BY MOUTH DAILY 90 tablet 1 01/09/2021 at Unknown time   ALPRAZolam (XANAX) 0.5 MG tablet Take 0.5-1 tablets (0.25-0.5 mg total) by mouth at bedtime as needed for anxiety. (Patient not taking: Reported on 12/06/2020) 30 tablet 0    methocarbamol (ROBAXIN) 500 MG tablet Take 1 tablet (500 mg total) by mouth  every 6 (six) hours as needed for muscle spasms. (Patient not taking: Reported on 12/06/2020) 60 tablet 1    traZODone (DESYREL) 50 MG tablet TAKE 1/2 TO 1 TABLET BY MOUTH AT BEDTIME AS NEEDED FOR SLEEP 30 tablet 5     Review of Systems  Constitutional: Negative.   Gastrointestinal: Negative.   Genitourinary: Positive for vaginal bleeding.   Physical Exam   Blood pressure 125/72, pulse 78, temperature 98.2 F (36.8 C), temperature source Oral, resp. rate 20, height 5\' 2"  (1.575 m), weight 68.4 kg, last menstrual period 11/26/2020, SpO2 99 %.  Physical Exam Vitals and nursing note reviewed. Exam conducted with a chaperone present.  Constitutional:      General: She is not in acute distress.    Appearance: Normal appearance. She is normal weight.  HENT:     Head: Normocephalic and atraumatic.  Pulmonary:     Effort: Pulmonary effort is normal. No respiratory distress.  Abdominal:     General: Abdomen is flat. There is no distension.     Palpations: Abdomen is soft.     Tenderness: There is no  abdominal tenderness. There is no guarding.  Genitourinary:    General: Normal vulva.     Exam position: Lithotomy position.     Vagina: Bleeding present.     Cervix: No cervical motion tenderness or friability.     Uterus: Normal.      Comments: Scant amount of dark red mucoid blood in vault. No active bleeding. Cervix closed.  Skin:    General: Skin is warm and dry.  Neurological:     Mental Status: She is alert.  Psychiatric:        Mood and Affect: Mood normal.        Behavior: Behavior normal.     MAU Course  Procedures Results for orders placed or performed during the hospital encounter of 01/10/21 (from the past 24 hour(s))  Pregnancy, urine POC     Status: Abnormal   Collection Time: 01/10/21 10:59 AM  Result Value Ref Range   Preg Test, Ur POSITIVE (A) NEGATIVE  Urinalysis, Routine w reflex microscopic     Status: Abnormal   Collection Time: 01/10/21 11:03 AM  Result Value Ref Range   Color, Urine STRAW (A) YELLOW   APPearance CLEAR CLEAR   Specific Gravity, Urine 1.003 (L) 1.005 - 1.030   pH 7.0 5.0 - 8.0   Glucose, UA NEGATIVE NEGATIVE mg/dL   Hgb urine dipstick MODERATE (A) NEGATIVE   Bilirubin Urine NEGATIVE NEGATIVE   Ketones, ur NEGATIVE NEGATIVE mg/dL   Protein, ur NEGATIVE NEGATIVE mg/dL   Nitrite NEGATIVE NEGATIVE   Leukocytes,Ua NEGATIVE NEGATIVE   RBC / HPF 0-5 0 - 5 RBC/hpf   WBC, UA 0-5 0 - 5 WBC/hpf   Bacteria, UA RARE (A) NONE SEEN   Squamous Epithelial / LPF 0-5 0 - 5  CBC     Status: None   Collection Time: 01/10/21 11:37 AM  Result Value Ref Range   WBC 4.7 4.0 - 10.5 K/uL   RBC 4.64 3.87 - 5.11 MIL/uL   Hemoglobin 13.6 12.0 - 15.0 g/dL   HCT 03/12/21 69.4 - 85.4 %   MCV 84.3 80.0 - 100.0 fL   MCH 29.3 26.0 - 34.0 pg   MCHC 34.8 30.0 - 36.0 g/dL   RDW 62.7 03.5 - 00.9 %   Platelets 195 150 - 400 K/uL   nRBC 0.0 0.0 - 0.2 %  ABO/Rh  Status: None   Collection Time: 01/10/21 11:37 AM  Result Value Ref Range   ABO/RH(D) A POS    No rh  immune globuloin      NOT A RH IMMUNE GLOBULIN CANDIDATE, PT RH POSITIVE Performed at [facility], [address] [place], [place] [address]   hCG, quantitative, pregnancy     Status: Abnormal   Collection Time: 01/10/21 11:37 AM  Result Value Ref Range   hCG, Beta Chain, Quant, S 161 (H) <5 mIU/mL   US OB LESS THAN 14 WEEKS WITH OB TRANSVAGINAL  Result Date: 01/10/2021 CLINICAL DATA:  35 year old with vaginal bleeding in first trimester pregnancy. EXAM: OBSTETRIC <14 WK [place] AND TRANSVAGINAL OB US TECHNIQUE: Both transabdominal and transvaginal ultrasound examinations were performed for complete evaluation of the gestation as well as the maternal uterus, adnexal regions, and pelvic cul-de-sac. Transvaginal technique was performed to assess early pregnancy. COMPARISON:  None. FINDINGS: Intrauterine gestational sac: None Yolk sac:  Not Visualized. Embryo:  Not Visualized. Subchorionic hemorrhage:  None visualized. Maternal uterus/adnexae: Normal appearance of the right ovary measuring 2.3 x 1.5 x 2.3 cm. Normal appearance of the left ovary measuring 2.0 x 1.7 x 2.4 cm. Trace pelvic fluid. No evidence for an intrauterine gestational sac. Mild thickening and heterogeneity involving the endometrium. IMPRESSION: No evidence for an intrauterine pregnancy at this time. Mild heterogeneity in the endometrium is indeterminate. Electronically Signed   By: Richarda Overlie M.D.   On: 01/10/2021 12:35    MDM +UPT UA, CBC, ABO/Rh, quant hCG, and [place] today to rule out ectopic pregnancy which can be life threatening.   Minimal bleeding on exam. Cervix closed.  RH positive  Ultrasound shows no IUP or adnexal mass. HCG today is 161. Patient reports her first positive pregnancy test was 2 weeks ago. Discussed this is concerning for miscarriage due to symptoms & low HCG but can't exclude ectopic pregnancy. Less likely this is normal pregnancy too early to identify on ultrasound. Will bring back on Sunday for repeat  HCG.   Assessment and Plan   1. Pregnancy of unknown anatomic location   2. Vaginal bleeding in pregnancy, first trimester   3. [redacted] weeks gestation of pregnancy    -reviewed ectopic & bleeding precautions -return to MAU Sunday for repeat HCG  Judeth Horn 01/10/2021, 2:12 PM   Attestation of Attending Supervision of Advanced Practice Provider (PA/CNM/NP): Evaluation and management procedures were performed by the Advanced Practice Provider under my supervision and collaboration.  I have reviewed the Advanced Practice Provider's note and chart, and I agree with the management and plan. I have also made any necessary editorial changes.   Sharon Seller, DO Attending Obstetrician & Gynecologist, [facility] for [facility], [facility] 01/10/2021 4:12 PM

## 2021-01-10 NOTE — Discharge Instructions (Signed)
Return to care   If you have heavier bleeding that soaks through more that 2 pads per hour for an hour or more  If you bleed so much that you feel like you might pass out or you do pass out  If you have significant abdominal pain that is not improved with Tylenol      Vaginal Bleeding During Pregnancy, First Trimester A small amount of bleeding from the vagina, or spotting, is common during early pregnancy. Some bleeding may be related to the pregnancy, and some may not. In many cases, the bleeding is normal and is not a problem. However, bleeding can also be a sign of something serious. Normal things that may cause bleeding during the first trimester:  Implantation of the fertilized egg in the lining of the uterus.  Rapid changes in blood vessels. This is caused by changes that are happening to the body during pregnancy.  Sex.  Pelvic exams. Abnormal things that may cause bleeding during the first trimester include:  Infection or inflammation of the cervix.  Growths or polyps on the cervix.  Miscarriage or threatened miscarriage.  Pregnancy that is growing outside of the uterus (ectopic pregnancy).  A fertilized egg that becomes a mass of tissue (molar pregnancy). Tell your health care provider right away if there is any bleeding from your vagina. Follow these instructions at home: Monitoring your bleeding Monitor your bleeding.  Pay attention to any changes in your symptoms. Let your health care provider know about any concerns.  Try to understand when the bleeding occurs. Does the bleeding start on its own, or does it start after something is done, such as sex or a pelvic exam?  Use a diary to record the things you see about your bleeding, including: ? The kind of bleeding you are having. Does the bleeding start and stop irregularly, or is it a constant flow? ? The severity of your bleeding. Is the bleeding heavy or light? ? The number of pads you use each day, how  often you change them, and how soaked they are.  Tell your health care provider if you pass tissue. He or she may want to see it.   Activity  Follow instructions from your health care provider about limiting your activity. Ask what activities are safe for you.  Do not have sex until your health care provider says that this is safe.  If needed, make plans for someone to help with your regular activities. General instructions  Take over-the-counter and prescription medicines only as told by your health care provider.  Do not take aspirin because it can cause bleeding.  Do not use tampons or douche.  Keep all follow-up visits. This is important. Contact a health care provider if:  You have vaginal bleeding during any part of your pregnancy.  You have cramps or labor pains.  You have a fever or chills. Get help right away if:  You have severe cramps in your back or abdomen.  You pass large clots or a large amount of tissue from your vagina.  Your bleeding increases.  You feel light-headed or weak, or you faint.  You are leaking fluid or have a gush of fluid from your vagina. Summary  A small amount of bleeding from the vagina is common during early pregnancy.  Be sure to tell your health care provider about any vaginal bleeding right away.  Try to understand when bleeding occurs. Does bleeding occur on its own, or does it occur after   something is done, such as sex or pelvic exams?  Keep all follow-up visits. This is important. This information is not intended to replace advice given to you by your health care provider. Make sure you discuss any questions you have with your health care provider. Document Revised: 07/18/2020 Document Reviewed: 07/18/2020 Elsevier Patient Education  2021 Elsevier Inc.  

## 2021-01-12 ENCOUNTER — Telehealth: Payer: Self-pay

## 2021-01-12 NOTE — Telephone Encounter (Signed)
HIPPA compliant message left requesting call back and encouraged patient to come to MAU as scheduled for repeat HCG.   Rolm Bookbinder, CNM 01/12/21 6:17 PM

## 2021-01-13 ENCOUNTER — Telehealth: Payer: Self-pay | Admitting: *Deleted

## 2021-01-13 ENCOUNTER — Other Ambulatory Visit: Payer: Self-pay | Admitting: *Deleted

## 2021-01-13 ENCOUNTER — Other Ambulatory Visit: Payer: Self-pay

## 2021-01-13 DIAGNOSIS — O209 Hemorrhage in early pregnancy, unspecified: Secondary | ICD-10-CM | POA: Diagnosis not present

## 2021-01-13 NOTE — Telephone Encounter (Signed)
Returned call from 9:24 AM. Left patient a message with new appointment type and lab information.

## 2021-01-14 LAB — BETA HCG QUANT (REF LAB): hCG Quant: 69 m[IU]/mL

## 2021-01-23 ENCOUNTER — Ambulatory Visit: Payer: BC Managed Care – PPO | Admitting: Obstetrics & Gynecology

## 2021-01-23 ENCOUNTER — Encounter: Payer: Self-pay | Admitting: Obstetrics & Gynecology

## 2021-01-23 ENCOUNTER — Other Ambulatory Visit: Payer: Self-pay

## 2021-01-23 VITALS — BP 105/65 | HR 87 | Resp 16 | Ht 62.0 in | Wt 152.0 lb

## 2021-01-23 DIAGNOSIS — Z31430 Encounter of female for testing for genetic disease carrier status for procreative management: Secondary | ICD-10-CM | POA: Diagnosis not present

## 2021-01-23 DIAGNOSIS — N96 Recurrent pregnancy loss: Secondary | ICD-10-CM

## 2021-01-24 LAB — CARDIOLIPIN ANTIBODIES, IGG, IGM, IGA
Anticardiolipin IgA: <2 APL-U/mL
Anticardiolipin IgG: <2 GPL-U/mL
Anticardiolipin IgM: <2 MPL-U/mL

## 2021-01-24 LAB — CBC
HCT: 39.6 % (ref 35.0–45.0)
Hemoglobin: 13 g/dL (ref 11.7–15.5)
MCH: 28.8 pg (ref 27.0–33.0)
MCHC: 32.8 g/dL (ref 32.0–36.0)
MCV: 87.8 fL (ref 80.0–100.0)
MPV: 11.5 fL (ref 7.5–12.5)
Platelets: 215 10*3/uL (ref 140–400)
RBC: 4.51 10*6/uL (ref 3.80–5.10)
RDW: 12.7 % (ref 11.0–15.0)
WBC: 6.9 10*3/uL (ref 3.8–10.8)

## 2021-01-24 LAB — TISSUE TRANSGLUTAMINASE, IGA: (tTG) Ab, IgA: <1 U/mL

## 2021-01-24 LAB — TSH: TSH: 2.05 mIU/L

## 2021-01-24 LAB — HEMOGLOBIN A1C
Hgb A1c MFr Bld: 5.4 % of total Hgb (ref <5.7)
Mean Plasma Glucose: 108 mg/dL
eAG (mmol/L): 6 mmol/L

## 2021-01-24 LAB — IGA: Immunoglobulin A: 84 mg/dL (ref 47–310)

## 2021-01-25 NOTE — Progress Notes (Addendum)
   Subjective:    Patient ID: Julia Jimenez, female    DOB: 1986/05/13, 35 y.o.   MRN: [id number]  HPI  35 yo female presents for two miscarriages in the past 3 months.  Pt recently married and conceived quickly.  Pt has one miscarriage in January and one miscarriage early march.  Pt was about 6 weeks with each miscarriage.  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  Pt concerned about recurrent miscarriage and needing initial work up.  Pt has regular menstrual cycles.  No family history of thrombophilia, recurrent miscarriages, chromosomal abnormalities.   Review of Systems  Constitutional: Positive for fatigue.  Respiratory: Negative.   Cardiovascular: Negative.   Gastrointestinal: Positive for nausea.  Genitourinary: Negative.        Objective:   Physical Exam Vitals reviewed.  Constitutional:      General: She is not in acute distress.    Appearance: She is well-developed.  HENT:     Head: Normocephalic and atraumatic.  Eyes:     Conjunctiva/sclera: Conjunctivae normal.  Cardiovascular:     Rate and Rhythm: Normal rate.  Pulmonary:     Effort: Pulmonary effort is normal.  Skin:    General: Skin is warm and dry.  Neurological:     Mental Status: She is alert and oriented to person, place, and time.   Reviewed medications--pt stops trazadone once + pregnancy test.     Assessment & Plan:  35 yo female with two first-trimester miscarriages.  No problems with conception   1. TSH, HgbA1C 2.  APLA panel 3.  PAI-1 4/5 gene 4.  Horizon panel extended 5.  Beta HCG due to fatigue and nausea 5.  Celiac disease screening  30 minutes spent with patient during visit, review of records. Coordination of care and documentation.

## 2021-01-29 LAB — LUPUS ANTICOAGULANT EVAL W/ REFLEX
PTT-LA Screen: 39 s (ref <=40)
dRVVT: 38 s (ref <=45)

## 2021-01-29 LAB — PLASMINOGEN ACTIVATOR INHIBITOR-1 (PAI-1) 4G/5G

## 2021-01-29 LAB — BETA-2 GLYCOPROTEIN ANTIBODIES
Beta-2 Glyco 1 IgA: <2 U/mL
Beta-2 Glyco 1 IgM: <2 U/mL
Beta-2 Glyco I IgG: <2 U/mL

## 2021-01-29 LAB — HCG, QUANTITATIVE, PREGNANCY: HCG, Total, QN: <3 m[IU]/mL

## 2021-01-31 ENCOUNTER — Other Ambulatory Visit: Payer: Self-pay | Admitting: *Deleted

## 2021-01-31 DIAGNOSIS — N96 Recurrent pregnancy loss: Secondary | ICD-10-CM

## 2021-02-03 ENCOUNTER — Encounter: Payer: Self-pay | Admitting: *Deleted

## 2021-02-03 DIAGNOSIS — M542 Cervicalgia: Secondary | ICD-10-CM | POA: Diagnosis not present

## 2021-02-03 DIAGNOSIS — M545 Low back pain, unspecified: Secondary | ICD-10-CM | POA: Diagnosis not present

## 2021-02-08 LAB — PLASMINOGEN ACTIVATOR INHIBITOR 1(PAI-1) 4G/5G POLYMORPHISM

## 2021-02-10 ENCOUNTER — Encounter: Payer: Self-pay | Admitting: Obstetrics & Gynecology

## 2021-02-10 DIAGNOSIS — N96 Recurrent pregnancy loss: Secondary | ICD-10-CM | POA: Insufficient documentation

## 2021-02-12 DIAGNOSIS — M542 Cervicalgia: Secondary | ICD-10-CM | POA: Diagnosis not present

## 2021-02-12 DIAGNOSIS — M545 Low back pain, unspecified: Secondary | ICD-10-CM | POA: Diagnosis not present

## 2021-02-17 DIAGNOSIS — M542 Cervicalgia: Secondary | ICD-10-CM | POA: Diagnosis not present

## 2021-02-17 DIAGNOSIS — M545 Low back pain, unspecified: Secondary | ICD-10-CM | POA: Diagnosis not present

## 2021-02-24 DIAGNOSIS — M542 Cervicalgia: Secondary | ICD-10-CM | POA: Diagnosis not present

## 2021-02-24 DIAGNOSIS — M545 Low back pain, unspecified: Secondary | ICD-10-CM | POA: Diagnosis not present

## 2021-03-04 ENCOUNTER — Other Ambulatory Visit: Payer: Self-pay | Admitting: Internal Medicine

## 2021-03-12 DIAGNOSIS — M542 Cervicalgia: Secondary | ICD-10-CM | POA: Diagnosis not present

## 2021-03-12 DIAGNOSIS — M545 Low back pain, unspecified: Secondary | ICD-10-CM | POA: Diagnosis not present

## 2021-03-14 ENCOUNTER — Other Ambulatory Visit: Payer: Self-pay

## 2021-03-14 ENCOUNTER — Ambulatory Visit
Admission: RE | Admit: 2021-03-14 | Discharge: 2021-03-14 | Disposition: A | Discharge status: Home / Self Care | Payer: BC Managed Care – PPO | Source: Ambulatory Visit | Attending: Family Medicine | Admitting: Family Medicine

## 2021-03-14 ENCOUNTER — Ambulatory Visit: Payer: BC Managed Care – PPO | Admitting: Family Medicine

## 2021-03-14 VITALS — BP 104/70 | Ht 62.0 in | Wt 150.0 lb

## 2021-03-14 DIAGNOSIS — G8929 Other chronic pain: Secondary | ICD-10-CM

## 2021-03-14 DIAGNOSIS — M545 Low back pain, unspecified: Secondary | ICD-10-CM

## 2021-03-14 MED ORDER — CYCLOBENZAPRINE HCL 5 MG PO TABS: 5.0000 mg | ORAL_TABLET | Freq: Every evening | ORAL | 2 refills | Status: DC | PRN

## 2021-03-14 NOTE — Progress Notes (Signed)
[facility]: Attending Note: I have reviewed the chart, discussed wit the Sports Medicine Fellow. I agree with assessment and treatment plan as detailed in the Fellow's note.  

## 2021-03-14 NOTE — Progress Notes (Signed)
Office Visit Note   Patient: Julia Jimenez           Date of Birth: [birth year]           MRN: [id number] Visit Date: 03/14/2021 Requested by: Pincus Sanes, MD [address] [place],  [place] [address] PCP: Pincus Sanes, MD  Subjective: CC: Chronic back pain  HPI: 35 year old, very active female, presenting to clinic with concerns of many years of nonradiating lower back pain.  Patient states that she has tried physical therapy, dry needling, massage, spinal adjustments, and medications-none of which has offered significant improvement in her symptoms.  She states the pain is located primarily in her lower lumbar spine and bilateral SI joints.  It will occasionally go into her glutes, but never down into her lower legs.  She denies any numbness or weakness in the feet.  No bowel or bladder dysfunctions.  She remains very active with exercise, and states that she does her physical therapy regimen multiple times weekly.  She is also involved in rigorous gym activities in an attempt to strengthen her core and improve her flexibility.              ROS:   All other systems were reviewed and are negative.  Objective: Vital Signs: BP 104/70   Ht 5\' 2"  (1.575 m)   Wt 150 lb (68 kg)   BMI 27.44 kg/m  No flowsheet data found.   No flowsheet data found.  Physical Exam:  General:  Alert and oriented, in no acute distress. Pulm:  Breathing unlabored. Psy:  Normal mood, congruent affect. Skin: Lower back without bruises, rashes, or erythema. Overlying skin intact.   BACK EXAMINATION: Normal Gait.  Normal Spinal curvature, without excessive lumbar lordosis, thoracic kyphosis, or scoliosis.  ROM: No Pain with forward flexion. Able to achieve Toe-Touch.  Endorses a "pinching" sensation in her left SI joint with extension.  This is worsened with single leg stance on the left leg.  Despite this, extension range of motion is intact. Palpation: Tenderness to palpation at bilateral SI  joints.  Tenderness at lower lumbar paraspinal muscles, approximately L4-L5.  There is also some tenderness at bilateral piriformis and gluteal musculature.  No midline tenderness, no deformity or step-offs.  Osteopathic Examination: Left on left forward sacral torsion.  Positive sacral springs Strength: Hip flexion (L1), Hip Aduction (L2), Knee Extension (L3) are 5/5 Bilaterally Foot Inversion (L4), Dorsiflexion (L5), and Eversion (S1) 5/5 Bilaterally Sensation: Intact to light touch medial and lateral aspects of lower extremities, and lateral, dorsal, and medial aspects of foot.   Special Tests:  FABER: No SI Joint Pain FADIR: No deep groin pain, or posterior hip pain. SLR: No radiation down Ipilateral or contralateral leg bilaterally Limb Length: Hips Aligned, No obvious discrepancy at medial malleolus    Imaging: No results found.  Assessment & Plan: 35 year old female with chronic lower back pain involving bilateral SI joints who is presenting to clinic having failed numerous conservative treatments.  Given her duration of symptoms, and lack of improvement with multiple conservative modalities, will obtain x-rays for initial radiographic evaluation.  If these x-rays are negative, could consider MRI in the future. -Given her desire to become pregnant, will switch from methocarbamol to Flexeril to be taking sparingly at night.  She is instructed to discontinue this medication if she becomes pregnant, and was cleared by obstetrics to continue. -Continue physical therapy exercises as previously prescribed. -Follow-up pending imaging results. -Patient no further questions  or concerns today.     Procedures: No procedures performed

## 2021-03-18 ENCOUNTER — Encounter: Payer: Self-pay | Admitting: Internal Medicine

## 2021-03-19 ENCOUNTER — Other Ambulatory Visit: Payer: Self-pay | Admitting: Internal Medicine

## 2021-04-04 DIAGNOSIS — F4323 Adjustment disorder with mixed anxiety and depressed mood: Secondary | ICD-10-CM | POA: Diagnosis not present

## 2021-04-08 ENCOUNTER — Other Ambulatory Visit: Payer: Self-pay | Admitting: *Deleted

## 2021-04-08 ENCOUNTER — Encounter: Payer: Self-pay | Admitting: *Deleted

## 2021-04-08 MED ORDER — HEPARIN SODIUM (PORCINE) 5000 UNIT/ML IJ SOLN
5000.0000 [IU] | Freq: Two times a day (BID) | INTRAMUSCULAR | 3 refills | Status: DC
  Filled 2021-04-09: qty 60, 30d supply, fill #0
  Filled 2021-05-02: qty 60, 30d supply, fill #1

## 2021-04-08 NOTE — Progress Notes (Signed)
RX for Heparin 5000 units sent to [facility] on [address] in [place] per VO of Dr Penne Lash.Marland Kitchen

## 2021-04-09 ENCOUNTER — Other Ambulatory Visit (HOSPITAL_COMMUNITY): Payer: Self-pay

## 2021-04-09 ENCOUNTER — Telehealth: Payer: Self-pay | Admitting: *Deleted

## 2021-04-09 MED ORDER — "INSULIN SYRINGE-NEEDLE U-100 30G X 5/16"" 1 ML MISC"
3 refills | Status: DC
  Filled 2021-04-09: qty 60, 30d supply, fill #0
  Filled 2021-05-02: qty 60, 30d supply, fill #1
  Filled 2021-06-04: qty 10, 5d supply, fill #2

## 2021-04-09 NOTE — Telephone Encounter (Signed)
[facility] does not have the Heparin in stock so RX was transferred to [facility].  Vials and syringes are available instead of the PF syringes of 5000 units BID.

## 2021-04-29 ENCOUNTER — Encounter: Payer: Self-pay | Admitting: *Deleted

## 2021-04-29 DIAGNOSIS — O09529 Supervision of elderly multigravida, unspecified trimester: Secondary | ICD-10-CM | POA: Insufficient documentation

## 2021-04-29 DIAGNOSIS — O0990 Supervision of high risk pregnancy, unspecified, unspecified trimester: Secondary | ICD-10-CM | POA: Insufficient documentation

## 2021-05-02 ENCOUNTER — Other Ambulatory Visit (HOSPITAL_COMMUNITY): Payer: Self-pay

## 2021-05-05 ENCOUNTER — Ambulatory Visit (INDEPENDENT_AMBULATORY_CARE_PROVIDER_SITE_OTHER): Payer: BC Managed Care – PPO | Admitting: Obstetrics & Gynecology

## 2021-05-05 ENCOUNTER — Other Ambulatory Visit (HOSPITAL_COMMUNITY)
Admission: RE | Admit: 2021-05-05 | Discharge: 2021-05-05 | Disposition: A | Discharge status: Home / Self Care | Payer: BC Managed Care – PPO | Source: Ambulatory Visit | Attending: Obstetrics & Gynecology | Admitting: Obstetrics & Gynecology

## 2021-05-05 ENCOUNTER — Other Ambulatory Visit: Payer: Self-pay

## 2021-05-05 VITALS — BP 118/71 | HR 92 | Wt 152.0 lb

## 2021-05-05 DIAGNOSIS — O09529 Supervision of elderly multigravida, unspecified trimester: Secondary | ICD-10-CM

## 2021-05-05 DIAGNOSIS — O0990 Supervision of high risk pregnancy, unspecified, unspecified trimester: Secondary | ICD-10-CM | POA: Insufficient documentation

## 2021-05-05 DIAGNOSIS — N96 Recurrent pregnancy loss: Secondary | ICD-10-CM

## 2021-05-05 DIAGNOSIS — Z1589 Genetic susceptibility to other disease: Secondary | ICD-10-CM

## 2021-05-05 NOTE — Progress Notes (Signed)
PHQ-8 GAD 10 DATING AND VIABILITY SONOGRAM   Michaeline Kristl Morioka is a 35 y.o. year old G3P0020 with LMP Patient's last menstrual period was 03/10/2021. which would correlate to  [redacted]w[redacted]d weeks gestation.  She has regular menstrual cycles.   She is here today for a confirmatory initial sonogram.    GESTATION: SINGLETON yes     FETAL ACTIVITY:          Heart rate         171          The fetus is active.  PLACENTA LOCALIZATION:        ADNEXA: The ovaries are normal.With a Left CL   GESTATIONAL AGE AND  BIOMETRICS:  Gestational criteria: Estimated Date of Delivery: 12/15/21 by LMP now at [redacted]w[redacted]d  Previous Scans:0      CROWN RUMP LENGTH           14.26 mm         [redacted]w[redacted]d weeks                                                                               AVERAGE EGA(BY THIS SCAN):  8 weeks  WORKING EDD( LMP ):  12/15/21     TECHNICIAN COMMENTS:  Single IUP with FHT of 171 BPM.  Lt CL noted and pt's bladder full after going to bathroom. A copy of this report including all images has been saved and backed up to a second source for retrieval if needed. All measures and details of the anatomical scan, placentation, fluid volume and pelvic anatomy are contained in that report.  Granville Lewis 05/05/2021 4:00 PM

## 2021-05-05 NOTE — Progress Notes (Signed)
Mb ref Subjective:    Julia Jimenez is a [id number] [redacted]w[redacted]d being seen today for her first obstetrical visit.  Her obstetrical history is significant for advanced maternal age and PAI 5G/5G  . Patient does intend to breast feed. Pregnancy history fully reviewed.  Patient reports fatigue and nausea.  Vitals:   05/05/21 1539  BP: 118/71  Pulse: 92  Weight: 152 lb (68.9 kg)    HISTORY: OB History  Gravida Para Term Preterm AB Living  3 0 0 0 2 0  SAB IAB Ectopic Multiple Live Births  2 0 0 0      # Outcome Date GA Lbr Len/2nd Weight Sex Delivery Anes PTL Lv  3 Current           2 SAB 11/2020          1 SAB            Past Medical History:  Diagnosis Date   Allergic rhinitis    Anemia    in high school   Anxiety    Elevated blood pressure reading without diagnosis of hypertension    in context of Adderall initiation   History of chicken pox    Infertility, female    Ovarian cyst, right 2014   Past Surgical History:  Procedure Laterality Date   CHOLECYSTECTOMY, LAPAROSCOPIC     TONSILLECTOMY AND ADENOIDECTOMY     WISDOM TOOTH EXTRACTION     Family History  Problem Relation Age of Onset   Hypertension Mother    Depression Mother    Anxiety disorder Mother    Diabetes Father    Hypertension Brother    Depression Brother    Diabetes Maternal Aunt    Stroke Maternal Uncle    Diabetes Maternal Uncle        x2   Diabetes Paternal Grandmother    Heart attack Paternal Grandmother    Cancer Maternal Grandfather        Dec CA of unknown origin   Hepatitis C Maternal Grandmother        Dec     Exam    Uterus:     Pelvic Exam:    Perineum: No Hemorrhoids   Vulva: normal   Vagina:  normal mucosa   pH: N/a   Cervix: no bleeding following Pap   Adnexa: normal adnexa   Bony Pelvis: average  System: Breast:  normal appearance, no masses or tenderness   Skin: normal coloration and turgor, no rashes    Neurologic: oriented, normal mood   Extremities: no  deformities   HEENT sclera clear, anicteric, oropharynx clear, no lesions, neck supple with midline trachea, thyroid without masses, and trachea midline   Mouth/Teeth mucous membranes moist, pharynx normal without lesions and dental hygiene good   Neck supple and no masses   Cardiovascular: regular rate and rhythm   Respiratory:  appears well, vitals normal, no respiratory distress, acyanotic, normal RR, chest clear, no wheezing, crepitations, rhonchi, normal symmetric air entry   Abdomen: soft, non-tender; bowel sounds normal; no masses,  no organomegaly   Urinary: urethral meatus normal      Assessment:    Pregnancy: G3P0020 at [redacted] weeks EGA by LMP.  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 Prenatal vitamins. Problem list reviewed and updated.  Baby Asa at 13 weeks Hgb A1C and CMP NIPS at 12 weeks Baby Rx optimized schedule TSH, CMP in addition to initial labs PAI 5G/5G needing prophylactic anticoagulation; heparin in 1st trimester and lovenox in 2nd trimester.  It can be d/c'd at 36 weeks or earlier if medically necessary.   Julia Jimenez 05/05/2021

## 2021-05-06 LAB — COMPREHENSIVE METABOLIC PANEL
AG Ratio: 1.8 (calc) (ref 1.0–2.5)
ALT: 30 U/L — ABNORMAL HIGH (ref 6–29)
AST: 21 U/L (ref 10–30)
Albumin: 4.5 g/dL (ref 3.6–5.1)
Alkaline phosphatase (APISO): 33 U/L (ref 31–125)
BUN: 13 mg/dL (ref 7–25)
CO2: 26 mmol/L (ref 20–32)
Calcium: 9.4 mg/dL (ref 8.6–10.2)
Chloride: 102 mmol/L (ref 98–110)
Creat: 0.66 mg/dL (ref 0.50–1.10)
Globulin: 2.5 g/dL (calc) (ref 1.9–3.7)
Glucose, Bld: 88 mg/dL (ref 65–99)
Potassium: 4.1 mmol/L (ref 3.5–5.3)
Sodium: 136 mmol/L (ref 135–146)
Total Bilirubin: 0.3 mg/dL (ref 0.2–1.2)
Total Protein: 7 g/dL (ref 6.1–8.1)

## 2021-05-06 LAB — HEMOGLOBIN A1C
Hgb A1c MFr Bld: 5.3 % of total Hgb (ref <5.7)
Mean Plasma Glucose: 105 mg/dL
eAG (mmol/L): 5.8 mmol/L

## 2021-05-06 LAB — CYTOLOGY - PAP
Chlamydia: NEGATIVE
Comment: NEGATIVE
Comment: NEGATIVE
Comment: NORMAL
Diagnosis: NEGATIVE
High risk HPV: NEGATIVE
Neisseria Gonorrhea: NEGATIVE

## 2021-05-06 LAB — TSH: TSH: 1.03 mIU/L

## 2021-05-07 ENCOUNTER — Encounter: Payer: Self-pay | Admitting: Obstetrics & Gynecology

## 2021-05-07 LAB — URINE CULTURE, OB REFLEX

## 2021-05-07 LAB — CULTURE, OB URINE

## 2021-05-08 LAB — OBSTETRIC PANEL
Absolute Monocytes: 539 cells/uL (ref 200–950)
Antibody Screen: NOT DETECTED
Basophils Absolute: 46 cells/uL (ref 0–200)
Basophils Relative: 0.6 %
Eosinophils Absolute: 100 cells/uL (ref 15–500)
Eosinophils Relative: 1.3 %
HCT: 40.8 % (ref 35.0–45.0)
Hemoglobin: 13.3 g/dL (ref 11.7–15.5)
Hepatitis B Surface Ag: NONREACTIVE
Lymphs Abs: 2187 cells/uL (ref 850–3900)
MCH: 28.7 pg (ref 27.0–33.0)
MCHC: 32.6 g/dL (ref 32.0–36.0)
MCV: 87.9 fL (ref 80.0–100.0)
MPV: 11.5 fL (ref 7.5–12.5)
Monocytes Relative: 7 %
Neutro Abs: 4828 cells/uL (ref 1500–7800)
Neutrophils Relative %: 62.7 %
Platelets: 229 10*3/uL (ref 140–400)
RBC: 4.64 10*6/uL (ref 3.80–5.10)
RDW: 12.9 % (ref 11.0–15.0)
RPR Ser Ql: NONREACTIVE
Rubella: 7.67 Index
Total Lymphocyte: 28.4 %
WBC: 7.7 10*3/uL (ref 3.8–10.8)

## 2021-05-08 LAB — HEMOGLOBINOPATHY EVALUATION
Fetal Hemoglobin Testing: <1 % (ref 0.0–1.9)
HCT: 41.3 % (ref 35.0–45.0)
Hemoglobin A2 - HGBRFX: 2.8 % (ref 2.2–3.2)
Hemoglobin: 13 g/dL (ref 11.7–15.5)
Hgb A: 97.2 % (ref >96.0)
MCH: 28 pg (ref 27.0–33.0)
MCV: 89 fL (ref 80.0–100.0)
RBC: 4.64 10*6/uL (ref 3.80–5.10)
RDW: 12.9 % (ref 11.0–15.0)

## 2021-05-08 LAB — HIV ANTIBODY (ROUTINE TESTING W REFLEX): HIV 1&2 Ab, 4th Generation: NONREACTIVE

## 2021-06-02 ENCOUNTER — Other Ambulatory Visit (HOSPITAL_COMMUNITY): Payer: Self-pay

## 2021-06-02 ENCOUNTER — Encounter: Payer: Self-pay | Admitting: Obstetrics & Gynecology

## 2021-06-02 ENCOUNTER — Ambulatory Visit (INDEPENDENT_AMBULATORY_CARE_PROVIDER_SITE_OTHER): Payer: BC Managed Care – PPO | Admitting: Obstetrics & Gynecology

## 2021-06-02 ENCOUNTER — Other Ambulatory Visit: Payer: Self-pay

## 2021-06-02 VITALS — BP 122/70 | HR 100 | Wt 154.0 lb

## 2021-06-02 DIAGNOSIS — R7989 Other specified abnormal findings of blood chemistry: Secondary | ICD-10-CM | POA: Diagnosis not present

## 2021-06-02 DIAGNOSIS — O09511 Supervision of elderly primigravida, first trimester: Secondary | ICD-10-CM | POA: Diagnosis not present

## 2021-06-02 DIAGNOSIS — O09529 Supervision of elderly multigravida, unspecified trimester: Secondary | ICD-10-CM

## 2021-06-02 DIAGNOSIS — Z1589 Genetic susceptibility to other disease: Secondary | ICD-10-CM

## 2021-06-02 DIAGNOSIS — Z3A12 12 weeks gestation of pregnancy: Secondary | ICD-10-CM

## 2021-06-02 MED ORDER — ENOXAPARIN SODIUM 40 MG/0.4ML IJ SOSY
40.0000 mg | PREFILLED_SYRINGE | INTRAMUSCULAR | 4 refills | Status: DC
  Filled 2021-06-02: qty 36, 90d supply, fill #0
  Filled 2021-09-01: qty 36, 90d supply, fill #1

## 2021-06-02 NOTE — Progress Notes (Signed)
NIPTS and CMP drawn

## 2021-06-02 NOTE — Progress Notes (Signed)
   PRENATAL VISIT NOTE  Subjective:  Julia Jimenez is a 35 y.o. G3P0020 at [redacted]w[redacted]d being seen today for ongoing prenatal care.  She is currently monitored for the following issues for this high-risk pregnancy and has Non-allergic rhinitis; Anxiety; Bursitis of shoulder, left; Family history of diabetes mellitus; Depression; Sleep difficulties; Chronic maxillary sinusitis; Lumbar radiculopathy; Chronic low back pain without sciatica; History of recurrent miscarriages; Encounter for supervision of high risk multigravida of advanced maternal age, antepartum; and Homozygous for 5G insertion allele on their problem list.  Patient reports no complaints.   . Vag. Bleeding: None.   . Denies leaking of fluid.   The following portions of the patient's history were reviewed and updated as appropriate: allergies, current medications, past family history, past medical history, past social history, past surgical history and problem list.   Objective:   Vitals:   06/02/21 1523  BP: 122/70  Pulse: 100  Weight: 154 lb (69.9 kg)    Fetal Status: Fetal Heart Rate (bpm): 156         General:  Alert, oriented and cooperative. Patient is in no acute distress.  Skin: Skin is warm and dry. No rash noted.   Cardiovascular: Normal heart rate noted  Respiratory: Normal respiratory effort, no problems with respiration noted  Abdomen: Soft, gravid, appropriate for gestational age.        Pelvic: Cervical exam deferred        Extremities: Normal range of motion.  Edema: None  Mental Status: Normal mood and affect. Normal behavior. Normal judgment and thought content.   Assessment and Plan:  Pregnancy: G3P0020 at [redacted]w[redacted]d 1. Elevated ALT on 05/05/29 ALT 30 then, will follow up results and manage accordingly. - Comprehensive metabolic panel  2. Homozygous for 5G insertion allele On Heparin 5000 u bid, will switch over to daily Lovenox. Will continue until 6 weeks postpartum. - enoxaparin (LOVENOX) 40 MG/0.4ML  injection; Inject 0.4 mLs (40 mg total) into the skin daily.  Dispense: 36 mL; Refill: 4 - [place] MFM OB DETAIL +14 WK; Future  3. [redacted] weeks gestation of pregnancy 4. Encounter for supervision of high risk multigravida of advanced maternal age, antepartum NIPS being done today, MFM scan ordered. - Genetic Screening - [place] MFM OB DETAIL +14 WK; Future No other complaints or concerns.  Routine obstetric precautions reviewed. On optimized BRX schedule, continue weekly BP and weight entries.  Please refer to After Visit Summary for other counseling recommendations.   Return in about 8 weeks (around 07/28/2021) for 20 week OFFICE OB VISIT (MD only) and possible AFP screen.    Jaynie Collins, MD

## 2021-06-02 NOTE — Patient Instructions (Addendum)
Second Trimester of Pregnancy  The second trimester of pregnancy is from week 13 through week 27. This is months 4 through 6 of pregnancy. The second trimester is often a time when you feel your best. Your body has adjusted to being pregnant, and you begin to feelbetter physically. During the second trimester: Morning sickness has lessened or stopped completely. You may have more energy. You may have an increase in appetite. The second trimester is also a time when the unborn baby (fetus) is growing rapidly. At the end of the sixth month, the fetus may be up to 12 inches long and weigh about 1 pounds. You will likely begin to feel the baby move (quickening) between 16 and 20 weeks of pregnancy. Body changes during your second trimester Your body continues to go through many changes during your second trimester.The changes vary and generally return to normal after the baby is born. Physical changes Your weight will continue to increase. You will notice your lower abdomen bulging out. You may begin to get stretch marks on your hips, abdomen, and breasts. Your breasts will continue to grow and to become tender. Dark spots or blotches (chloasma or mask of pregnancy) may develop on your face. A dark line from your belly button to the pubic area (linea nigra) may appear. You may have changes in your hair. These can include thickening of your hair, rapid growth, and changes in texture. Some people also have hair loss during or after pregnancy, or hair that feels dry or thin. Health changes You may develop headaches. You may have heartburn. You may develop constipation. You may develop hemorrhoids or swollen, bulging veins (varicose veins). Your gums may bleed and may be sensitive to brushing and flossing. You may urinate more often because the fetus is pressing on your bladder. You may have back pain. This is caused by: Weight gain. Pregnancy hormones that are relaxing the joints in your  pelvis. A shift in weight and the muscles that support your balance. Follow these instructions at home: Medicines Follow your health care provider's instructions regarding medicine use. Specific medicines may be either safe or unsafe to take during pregnancy. Do not take any medicines unless approved by your health care provider. Take a prenatal vitamin that contains at least 600 micrograms (mcg) of folic acid. Eating and drinking Eat a healthy diet that includes fresh fruits and vegetables, whole grains, good sources of protein such as meat, eggs, or tofu, and low-fat dairy products. Avoid raw meat and unpasteurized juice, milk, and cheese. These carry germs that can harm you and your baby. You may need to take these actions to prevent or treat constipation: Drink enough fluid to keep your urine pale yellow. Eat foods that are high in fiber, such as beans, whole grains, and fresh fruits and vegetables. Limit foods that are high in fat and processed sugars, such as fried or sweet foods. Activity Exercise only as directed by your health care provider. Most people can continue their usual exercise routine during pregnancy. Try to exercise for 30 minutes at least 5 days a week. Stop exercising if you develop contractions in your uterus. Stop exercising if you develop pain or cramping in the lower abdomen or lower back. Avoid exercising if it is very hot or humid or if you are at a high altitude. Avoid heavy lifting. If you choose to, you may have sex unless your health care provider tells you not to. Relieving pain and discomfort Wear a supportive bra to   prevent discomfort from breast tenderness. Take warm sitz baths to soothe any pain or discomfort caused by hemorrhoids. Use hemorrhoid cream if your health care provider approves. Rest with your legs raised (elevated) if you have leg cramps or low back pain. If you develop varicose veins: Wear support hose as told by your health care  provider. Elevate your feet for 15 minutes, 3-4 times a day. Limit salt in your diet. Safety Wear your seat belt at all times when driving or riding in a car. Talk with your health care provider if someone is verbally or physically abusive to you. Lifestyle Do not use hot tubs, steam rooms, or saunas. Do not douche. Do not use tampons or scented sanitary pads. Avoid cat litter boxes and soil used by cats. These carry germs that can cause birth defects in the baby and possibly loss of the fetus by miscarriage or stillbirth. Do not use herbal remedies, alcohol, illegal drugs, or medicines that are not approved by your health care provider. Chemicals in these products can harm your baby. Do not use any products that contain nicotine or tobacco, such as cigarettes, e-cigarettes, and chewing tobacco. If you need help quitting, ask your health care provider. General instructions During a routine prenatal visit, your health care provider will do a physical exam and other tests. He or she will also discuss your overall health. Keep all follow-up visits. This is important. Ask your health care provider for a referral to a local prenatal education class. Ask for help if you have counseling or nutritional needs during pregnancy. Your health care provider can offer advice or refer you to specialists for help with various needs. Where to find more information American Pregnancy Association: americanpregnancy.org [employer]: https://www.todd-brady.net/ Office on [employer] Health: MightyReward.[employer].nz Contact a health care provider if you have: A headache that does not go away when you take medicine. Vision changes or you see spots in front of your eyes. Mild pelvic cramps, pelvic pressure, or nagging pain in the abdominal area. Persistent nausea, vomiting, or diarrhea. A bad-smelling vaginal discharge or foul-smelling urine. Pain when you  urinate. Sudden or extreme swelling of your face, hands, ankles, feet, or legs. A fever. Get help right away if you: Have fluid leaking from your vagina. Have spotting or bleeding from your vagina. Have severe abdominal cramping or pain. Have difficulty breathing. Have chest pain. Have fainting spells. Have not felt your baby move for the time period told by your health care provider. Have new or increased pain, swelling, or redness in an arm or leg. Summary The second trimester of pregnancy is from week 13 through week 27 (months 4 through 6). Do not use herbal remedies, alcohol, illegal drugs, or medicines that are not approved by your health care provider. Chemicals in these products can harm your baby. Exercise only as directed by your health care provider. Most people can continue their usual exercise routine during pregnancy. Keep all follow-up visits. This is important. This information is not intended to replace advice given to you by your health care provider. Make sure you discuss any questions you have with your healthcare provider. Document Revised: 04/03/2020 Document Reviewed: 02/08/2020 Elsevier Patient Education  2022 Else[employer] Inc. Alpha-Fetoprotein Test Why am I having this test? The alpha-fetoprotein test is a lab test most commonly used for pregnant women to help screen for birth defects in their unborn baby. It can be used to screen for chromosome (DNA) abnormalities, problems with the brain or  spinal cord, or problems with the abdominal wall of the unborn baby (fetus). The alpha-fetoprotein test may also be done for men or nonpregnant women tocheck for certain cancers. What is being tested? This test measures the amount of alpha-fetoprotein (AFP) in your blood. AFP is a protein that is made by the liver. Levels can be detected in the mother's blood during pregnancy, starting at 10 weeks and peaking at 16-18 weeks of the pregnancy. Abnormal levels can sometimes be a sign  of a birth defect in thebaby. Certain cancers can cause a high level of AFP in men and nonpregnant women. What kind of sample is taken?  A blood sample is required for this test. It is usually collected by insertinga needle into a blood vessel. How are the results reported? Your test results will be reported as values. Your health care provider will compare your results to normal ranges that were established after testing a large group of people (reference values). Reference values may vary among labs and hospitals. For this test, common reference values are: Adult: Less than 40 ng/mL or less than 40 mcg/L (SI units). Child younger than 1 year: Less than 30 ng/mL. If you are pregnant, the values may also vary based on how long you have beenpregnant. What do the results mean? Results that are above the reference values in pregnant women may indicate the following for the baby: Neural tube defects, such as abnormalities of the spinal cord or brain. Abdominal wall defects. Multiple pregnancy such as twins. Fetal distress or fetal death. Results that are above the reference values in men or nonpregnant women may indicate: Reproductive cancers, such as ovarian or testicular cancer. Liver cancer. Liver cell death. Other types of cancer. Very low levels of AFP in pregnant women may indicate Down syndrome for thebaby. Talk with your health care provider about what your results mean. Questions to ask your health care provider Ask your health care provider, or the department that is doing the test: When will my results be ready? How will I get my results? What are my treatment options? What other tests do I need? What are my next steps? Summary The alpha-fetoprotein test is done on pregnant women to help screen for birth defects in their unborn baby. Certain cancers can cause a high level of AFP in men and nonpregnant women. For this test, a blood sample is usually collected by inserting a  needle into a blood vessel. Talk with your health care provider about what your results mean. This information is not intended to replace advice given to you by your health care provider. Make sure you discuss any questions you have with your healthcare provider. Document Revised: 05/17/2020 Document Reviewed: 05/17/2020 Elsevier Patient Education  2022 [employer].

## 2021-06-03 ENCOUNTER — Other Ambulatory Visit (HOSPITAL_COMMUNITY): Payer: Self-pay

## 2021-06-03 LAB — COMPREHENSIVE METABOLIC PANEL
AG Ratio: 1.9 (calc) (ref 1.0–2.5)
ALT: 20 U/L (ref 6–29)
AST: 11 U/L (ref 10–30)
Albumin: 4.5 g/dL (ref 3.6–5.1)
Alkaline phosphatase (APISO): 25 U/L — ABNORMAL LOW (ref 31–125)
BUN: 11 mg/dL (ref 7–25)
CO2: 26 mmol/L (ref 20–32)
Calcium: 9.7 mg/dL (ref 8.6–10.2)
Chloride: 101 mmol/L (ref 98–110)
Creat: 0.53 mg/dL (ref 0.50–0.97)
Globulin: 2.4 g/dL (calc) (ref 1.9–3.7)
Glucose, Bld: 85 mg/dL (ref 65–99)
Potassium: 4 mmol/L (ref 3.5–5.3)
Sodium: 136 mmol/L (ref 135–146)
Total Bilirubin: 0.3 mg/dL (ref 0.2–1.2)
Total Protein: 6.9 g/dL (ref 6.1–8.1)

## 2021-06-04 ENCOUNTER — Other Ambulatory Visit (HOSPITAL_COMMUNITY): Payer: Self-pay

## 2021-06-12 ENCOUNTER — Other Ambulatory Visit (HOSPITAL_COMMUNITY): Payer: Self-pay

## 2021-06-16 ENCOUNTER — Encounter: Payer: Self-pay | Admitting: *Deleted

## 2021-06-16 ENCOUNTER — Other Ambulatory Visit: Payer: Self-pay | Admitting: Internal Medicine

## 2021-06-16 DIAGNOSIS — O09529 Supervision of elderly multigravida, unspecified trimester: Secondary | ICD-10-CM

## 2021-06-16 NOTE — Progress Notes (Signed)
Panorama results scanned and pt aware of results

## 2021-07-25 ENCOUNTER — Encounter: Payer: Self-pay | Admitting: *Deleted

## 2021-07-25 ENCOUNTER — Other Ambulatory Visit: Payer: Self-pay | Admitting: *Deleted

## 2021-07-25 ENCOUNTER — Ambulatory Visit: Payer: BC Managed Care – PPO | Attending: Obstetrics & Gynecology

## 2021-07-25 ENCOUNTER — Ambulatory Visit: Payer: BC Managed Care – PPO | Attending: Pediatrics | Admitting: Obstetrics

## 2021-07-25 ENCOUNTER — Other Ambulatory Visit: Payer: Self-pay

## 2021-07-25 ENCOUNTER — Ambulatory Visit: Payer: BC Managed Care – PPO | Admitting: *Deleted

## 2021-07-25 VITALS — BP 127/73 | HR 89

## 2021-07-25 DIAGNOSIS — Z1589 Genetic susceptibility to other disease: Secondary | ICD-10-CM

## 2021-07-25 DIAGNOSIS — O09522 Supervision of elderly multigravida, second trimester: Secondary | ICD-10-CM

## 2021-07-25 DIAGNOSIS — O99112 Other diseases of the blood and blood-forming organs and certain disorders involving the immune mechanism complicating pregnancy, second trimester: Secondary | ICD-10-CM

## 2021-07-25 DIAGNOSIS — D6859 Other primary thrombophilia: Secondary | ICD-10-CM

## 2021-07-25 DIAGNOSIS — Z3A19 19 weeks gestation of pregnancy: Secondary | ICD-10-CM

## 2021-07-25 DIAGNOSIS — O09529 Supervision of elderly multigravida, unspecified trimester: Secondary | ICD-10-CM

## 2021-07-25 NOTE — Progress Notes (Signed)
MFM Note  Julia Jimenez was seen for a detailed fetal anatomy scan due to advanced maternal age.  The patient has a history of two prior miscarriages.  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  She denies any personal or family history of a prior thromboembolic event.  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 She denies any other significant past medical history and denies any problems in her current pregnancy.    She had a cell free DNA test earlier in her pregnancy which indicated a low risk for trisomy 62, 22, and 13. A female fetus is predicted.   She was informed that the fetal growth and amniotic fluid level were appropriate for her gestational age.   There were no obvious fetal anomalies noted on today's ultrasound exam.  The patient was informed that anomalies may be missed due to technical limitations. If the fetus is in a suboptimal position or maternal habitus is increased, visualization of the fetus in the maternal uterus may be impaired.  The following were discussed today:  Advanced maternal age in pregnancy  The increased risk of fetal aneuploidy due to advanced maternal age was discussed.   Due to advanced maternal age, the patient was offered and declined an amniocentesis today for definitive diagnosis of fetal aneuploidy.  She is comfortable with today's normal ultrasound and her negative cell free DNA test results.  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  As she has already been started on prophylactic Lovenox, she should continue taking prophylactic Lovenox (40 mg daily) for the duration of her pregnancy.  In  regards to the management of her anticoagulation and delivery, she may continue the prophylactic Lovenox up until the day before her scheduled delivery date.    Alternatively, she may be switched to twice daily subcu heparin (10,000 units twice daily) at around 35 to 36 weeks.    The benefit of switching to subcutaneous heparin is that it can be reversed with protamine sulfate.  The downside of subcu heparin is that she would have to give herself injections twice a day.  As she is only treated with a prophylactic dose of anticoagulation, she may be eligible to receive regional anesthesia if it has been 12 hours or more since she received the anticoagulation medication.   As she is only being treated with anticoagulation therapy for prevention of miscarriage and as she does not have any personal or family history of a prior thromboembolic event, she will probably not require anticoagulation after delivery.    The small association of thrombophilias with fetal growth restriction later in pregnancy was discussed.  Due to this indication, we will continue to follow her with growth ultrasounds throughout her pregnancy.  The patient stated that all of her questions had been answered.    A follow-up growth scan was scheduled in 4 weeks.   A total of 30 minutes was spent counseling and coordinating the care for this patient.  Greater than 50% of the time was spent in direct face-to-face contact.  Recommendations:  Continue prophylactic Lovenox for the duration of pregnancy An alternative regimen is switching to subcutaneous heparin at 36 weeks She will probably not  require anticoagulation after delivery Monthly growth ultrasounds

## 2021-07-29 NOTE — Progress Notes (Signed)
   PRENATAL VISIT NOTE  Subjective:  Julia Jimenez is a 35 y.o. G3P0020 at [redacted]w[redacted]d being seen today for ongoing prenatal care.  She is currently monitored for the following issues for this low-risk pregnancy and has Non-allergic rhinitis; Anxiety; Bursitis of shoulder, left; Family history of diabetes mellitus; Depression; Sleep difficulties; Chronic maxillary sinusitis; Lumbar radiculopathy; Chronic low back pain without sciatica; History of recurrent miscarriages; Encounter for supervision of high risk multigravida of advanced maternal age, antepartum; and Homozygous for 5G insertion allele on their problem list.  Patient reports no complaints.  Contractions: Not present. Vag. Bleeding: None.  Movement: Absent. Denies leaking of fluid.   The following portions of the patient's history were reviewed and updated as appropriate: allergies, current medications, past family history, past medical history, past social history, past surgical history and problem list.   Objective:   Vitals:   07/31/21 1505  BP: 104/66  Pulse: 94  Weight: 165 lb (74.8 kg)    Fetal Status: Fetal Heart Rate (bpm): 145   Movement: Absent     General:  Alert, oriented and cooperative. Patient is in no acute distress.  Skin: Skin is warm and dry. No rash noted.   Cardiovascular: Normal heart rate noted  Respiratory: Normal respiratory effort, no problems with respiration noted  Abdomen: Soft, gravid, appropriate for gestational age.  Pain/Pressure: Absent     Pelvic: Cervical exam deferred        Extremities: Normal range of motion.  Edema: Trace  Mental Status: Normal mood and affect. Normal behavior. Normal judgment and thought content.   Assessment and Plan:  Pregnancy: G3P0020 at [redacted]w[redacted]d 1. Encounter for supervision of high risk multigravida of advanced maternal age, antepartum - ALT resolved to wnl - NIPs normal - AFP recommended and pt accepts - Anatomy done on 9/16 and incomplete. Has f/u on 10/14  for ductal arch, lower extremities. - She already had flu shot.   2. 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 May not need prophylaxis following delivery.  - She desires IOL at 39w and plans to likely stay on lovenox  until delivery  Preterm labor symptoms and general obstetric precautions including but not limited to vaginal bleeding, contractions, leaking of fluid and fetal movement were reviewed in detail with the patient. Please refer to After Visit Summary for other counseling recommendations.   Return in about 1 month (around 08/30/2021) for OB VISIT, MD only.  Future Appointments  Date Time Provider Department Center  08/22/2021  3:15 PM [facility] NURSE [facility]  08/22/2021  3:30 PM [facility] US3 [facility]    Milas Hock, MD

## 2021-07-31 ENCOUNTER — Ambulatory Visit (INDEPENDENT_AMBULATORY_CARE_PROVIDER_SITE_OTHER): Payer: BC Managed Care – PPO | Admitting: Obstetrics and Gynecology

## 2021-07-31 ENCOUNTER — Encounter: Payer: Self-pay | Admitting: Obstetrics and Gynecology

## 2021-07-31 ENCOUNTER — Other Ambulatory Visit: Payer: Self-pay

## 2021-07-31 VITALS — BP 104/66 | HR 94 | Wt 165.0 lb

## 2021-07-31 DIAGNOSIS — Z1589 Genetic susceptibility to other disease: Secondary | ICD-10-CM

## 2021-07-31 DIAGNOSIS — O09529 Supervision of elderly multigravida, unspecified trimester: Secondary | ICD-10-CM | POA: Diagnosis not present

## 2021-08-01 LAB — ALPHA FETOPROTEIN, MATERNAL
AFP MoM: 1.78
AFP, Serum: 108.2 ng/mL
Calc'd Gestational Age: 20.4 weeks
Maternal Wt: 154 [lb_av]
Risk for ONTD: 1
Twins-AFP: 1

## 2021-08-08 DIAGNOSIS — H5213 Myopia, bilateral: Secondary | ICD-10-CM | POA: Diagnosis not present

## 2021-08-22 ENCOUNTER — Other Ambulatory Visit: Payer: Self-pay

## 2021-08-22 ENCOUNTER — Ambulatory Visit: Payer: BC Managed Care – PPO | Admitting: *Deleted

## 2021-08-22 ENCOUNTER — Encounter: Payer: Self-pay | Admitting: *Deleted

## 2021-08-22 ENCOUNTER — Ambulatory Visit: Payer: BC Managed Care – PPO | Attending: Obstetrics

## 2021-08-22 ENCOUNTER — Other Ambulatory Visit: Payer: Self-pay | Admitting: Obstetrics

## 2021-08-22 VITALS — BP 120/76 | HR 88

## 2021-08-22 DIAGNOSIS — O09529 Supervision of elderly multigravida, unspecified trimester: Secondary | ICD-10-CM | POA: Insufficient documentation

## 2021-08-22 DIAGNOSIS — Z3A23 23 weeks gestation of pregnancy: Secondary | ICD-10-CM | POA: Diagnosis not present

## 2021-08-22 DIAGNOSIS — O09522 Supervision of elderly multigravida, second trimester: Secondary | ICD-10-CM | POA: Diagnosis not present

## 2021-08-22 DIAGNOSIS — Z1589 Genetic susceptibility to other disease: Secondary | ICD-10-CM | POA: Diagnosis not present

## 2021-08-25 ENCOUNTER — Other Ambulatory Visit: Payer: Self-pay | Admitting: *Deleted

## 2021-08-25 DIAGNOSIS — O09523 Supervision of elderly multigravida, third trimester: Secondary | ICD-10-CM

## 2021-09-01 ENCOUNTER — Other Ambulatory Visit: Payer: Self-pay

## 2021-09-01 ENCOUNTER — Encounter: Payer: Self-pay | Admitting: Obstetrics and Gynecology

## 2021-09-01 ENCOUNTER — Telehealth (INDEPENDENT_AMBULATORY_CARE_PROVIDER_SITE_OTHER): Payer: BC Managed Care – PPO | Admitting: Obstetrics and Gynecology

## 2021-09-01 VITALS — BP 120/75 | Wt 173.0 lb

## 2021-09-01 DIAGNOSIS — O09529 Supervision of elderly multigravida, unspecified trimester: Secondary | ICD-10-CM

## 2021-09-01 DIAGNOSIS — Z1589 Genetic susceptibility to other disease: Secondary | ICD-10-CM

## 2021-09-01 DIAGNOSIS — O09522 Supervision of elderly multigravida, second trimester: Secondary | ICD-10-CM

## 2021-09-01 DIAGNOSIS — Z3A25 25 weeks gestation of pregnancy: Secondary | ICD-10-CM

## 2021-09-01 NOTE — Progress Notes (Signed)
Pt states her feet ache a lot- minimal swelling Pt c/o increase in pelvic pressure

## 2021-09-01 NOTE — Progress Notes (Signed)
OBSTETRICS PRENATAL VIRTUAL VISIT ENCOUNTER NOTE  Provider location: [facility] for [facility] [place]   Patient location: Home  I connected with Shawnee Knapp on 09/01/21 at  3:10 PM EDT by MyChart Video Encounter and verified that I am speaking with the correct person using two identifiers. I discussed the limitations, risks, security and privacy concerns of performing an evaluation and management service virtually and the availability of in person appointments. I also discussed with the patient that there may be a patient responsible charge related to this service. The patient expressed understanding and agreed to proceed. Subjective:  Jaicee Michelotti is a 35 y.o. G3P0020 at [redacted]w[redacted]d being seen today for ongoing prenatal care.  She is currently monitored for the following issues for this high-risk pregnancy and has Non-allergic rhinitis; Anxiety; Bursitis of shoulder, left; Family history of diabetes mellitus; Depression; Sleep difficulties; Chronic maxillary sinusitis; Lumbar radiculopathy; Chronic low back pain without sciatica; History of recurrent miscarriages; Encounter for supervision of high risk multigravida of advanced maternal age, antepartum; and Homozygous for 5G insertion allele on their problem list.  Patient reports  URI and pelvic pressure intermittently .  Contractions: Not present. Vag. Bleeding: None.  Movement: Present. Denies any leaking of fluid.   The following portions of the patient's history were reviewed and updated as appropriate: allergies, current medications, past family history, past medical history, past social history, past surgical history and problem list.   Objective:   Vitals:   09/01/21 1454  BP: 120/75  Weight: 173 lb (78.5 kg)    Fetal Status:     Movement: Present     General:  Alert, oriented and cooperative. Patient is in no acute distress.  Respiratory: Normal respiratory effort, no problems with respiration noted   Mental Status: Normal mood and affect. Normal behavior. Normal judgment and thought content.  Rest of physical exam deferred due to type of encounter  Imaging: [place] MFM OB Transvaginal  Result Date: 08/22/2021 ----------------------------------------------------------------------  OBSTETRICS REPORT                       (Signed Final 08/22/2021 05:09 pm) ---------------------------------------------------------------------- Patient Info  ID #:       [id number]                          D.O.B.:  06-12-1986 (35 yrs)  Name:       Julia Jimenez             Visit Date: 08/22/2021 03:09 pm ---------------------------------------------------------------------- Performed By  Attending:        Noralee Space MD        Ref. Address:     [address]                                                             [place], [place]  [address]  Performed By:     Clayton Lefort RDMS       [facility]  Referred By:      Tereso Newcomer MD ---------------------------------------------------------------------- Orders  #  Description                           Code        Ordered By  1  [place] MFM OB FOLLOW UP                   76816.01    YU FANG  2  [place] MFM OB TRANSVAGINAL                16553.7     YU FANG ----------------------------------------------------------------------  #  Order #                     Accession #                Episode #  1  [id number]                   [id number]                 [id number]  2  [id number]                   [id number]                 [id number] ---------------------------------------------------------------------- Indications   Advanced maternal age multigravida 29+,        O4.522  second trimester  [redacted] weeks gestation of pregnancy                Z3A.23  Medical complication of pregnancy -            O26.90  Homozygous for 5G insertion allele- on  lovenox  LR NIPS, Neg Horizon ---------------------------------------------------------------------- Fetal Evaluation  Num Of Fetuses:         1  Fetal Heart Rate(bpm):  147  Cardiac Activity:       Observed  Presentation:           Breech  Placenta:  Anterior  P. Cord Insertion:      Previously Visualized  Amniotic Fluid  AFI FV:      Within normal limits                              Largest Pocket(cm)                              6.06 ---------------------------------------------------------------------- Biometry  BPD:      59.1  mm     G. Age:  24w 1d         66  %    CI:        68.78   %    70 - 86                                                          FL/HC:      18.2   %    18.7 - 20.9  HC:      227.7  mm     G. Age:  24w 6d         79  %    HC/AC:      1.12        1.05 - 1.21  AC:      203.1  mm     G. Age:  24w 6d         82  %    FL/BPD:     70.2   %    71 - 87  FL:       41.5  mm     G. Age:  23w 3d         35  %    FL/AC:      20.4   %    20 - 24  Est. FW:     686  gm      1 lb 8 oz     78  % ---------------------------------------------------------------------- OB History  Gravidity:    3          SAB:   2  Living:       0 ---------------------------------------------------------------------- Gestational Age  LMP:           23w 4d        Date:  03/10/21                 EDD:   12/15/21  U/S Today:     24w 2d                                        EDD:   12/10/21  Best:          23w 4d     Det. By:  LMP  (03/10/21)          EDD:   12/15/21 ---------------------------------------------------------------------- Anatomy  Cranium:               Appears normal         Aortic Arch:            Previously seen  Cavum:  Previously seen        Ductal Arch:             Appears normal  Ventricles:            Appears normal         Diaphragm:              Previously seen  Choroid Plexus:        Previously seen        Stomach:                Appears normal, left                                                                        sided  Cerebellum:            Previously seen        Abdomen:                Previously seen  Posterior Fossa:       Previously seen        Abdominal Wall:         Previously seen  Nuchal Fold:           Previously seen        Cord Vessels:           Previously seen  Face:                  Orbits and profile     Kidneys:                Appear normal                         previously seen  Lips:                  Previously seen        Bladder:                Appears normal  Thoracic:              Previously seen        Spine:                  Previously seen  Heart:                 Appears normal         Upper Extremities:      Previously seen                         (4CH, axis, and                         situs)  RVOT:                  Previously seen        Lower Extremities:      Appears normal  LVOT:                  Previously seen  Other:  Fetus appears to be a female. Heels and 5th digit  previously visualized.          VC seen, 3VV and 3VTV previously visualized. ---------------------------------------------------------------------- Cervix Uterus Adnexa  Cervix  Length:            3.4  cm.  Normal appearance by transabdominal scan.  Right Ovary  Not visualized.  Left Ovary  Not visualized. ---------------------------------------------------------------------- Impression  Patient returned for completion of fetal anatomy .Amniotic  fluid is normal and good fetal activity is seen .Fetal biometry  is consistent with her previously-established dates .Fetal  anatomical survey was completed and appears normal.  Patient has symptoms of pelvic pressure since morning.  No  history of vaginal bleeding.  We performed transvaginal  ultrasound to evaluate the  cervix.  The cervix measures 3.4  cm, which is within normal range.  We reassured the patient  of the findings.  She has a history of early miscarriages and on screening  found to have PAI-1 mutation.  She takes Lovenox and would  like to continue Lovenox throughout her pregnancy. ---------------------------------------------------------------------- Recommendations  -An appointment was made for her to return in 9 weeks for  fetal growth assessment. ----------------------------------------------------------------------                  Noralee Space, MD Electronically Signed Final Report   08/22/2021 05:09 pm ----------------------------------------------------------------------  [place] MFM OB FOLLOW UP  Result Date: 08/22/2021 ----------------------------------------------------------------------  OBSTETRICS REPORT                       (Signed Final 08/22/2021 05:09 pm) ---------------------------------------------------------------------- Patient Info  ID #:       [id number]                          D.O.B.:  June 14, 1986 (35 yrs)  Name:       SHONDELL FABEL Jimenez             Visit Date: 08/22/2021 03:09 pm ---------------------------------------------------------------------- Performed By  Attending:        Noralee Space MD        Ref. Address:     [address]                                                             [place], [place]                                                             [address]  Performed By:     Clayton Lefort RDMS       [facility]  Referred By:      Tereso Newcomer MD ---------------------------------------------------------------------- Orders  #  Description                            Code        Ordered By  1  [place] MFM OB FOLLOW UP                   [phone]    YU FANG  2  [place] MFM OB TRANSVAGINAL                53614.4     YU FANG ----------------------------------------------------------------------  #  Order #                     Accession #                Episode #  1  [id number]                   [id number]                 [id number]  2  [id number]                   [id number]                 [id number] ---------------------------------------------------------------------- Indications  Advanced maternal age multigravida 73+,        O69.522  second trimester  [redacted] weeks gestation of pregnancy                Z3A.23  Medical complication of pregnancy -            O26.90  Homozygous for 5G insertion allele- on  lovenox  LR NIPS, Neg Horizon ---------------------------------------------------------------------- Fetal Evaluation  Num Of Fetuses:         1  Fetal Heart Rate(bpm):  147  Cardiac Activity:       Observed  Presentation:           Breech  Placenta:               Anterior  P. Cord Insertion:      Previously Visualized  Amniotic Fluid  AFI FV:      Within normal limits                              Largest Pocket(cm)  6.06 ---------------------------------------------------------------------- Biometry  BPD:      59.1  mm     G. Age:  24w 1d         66  %    CI:        68.78   %    70 - 86                                                          FL/HC:      18.2   %    18.7 - 20.9  HC:      227.7  mm     G. Age:  24w 6d         79  %    HC/AC:      1.12        1.05 - 1.21  AC:      203.1  mm     G. Age:  24w 6d         82  %    FL/BPD:     70.2   %    71 - 87  FL:       41.5  mm     G. Age:  23w 3d         35  %    FL/AC:      20.4   %    20 - 24  Est. FW:     686  gm      1 lb 8 oz     78  % ---------------------------------------------------------------------- OB History  Gravidity:    3          SAB:   2  Living:       0  ---------------------------------------------------------------------- Gestational Age  LMP:           23w 4d        Date:  03/10/21                 EDD:   12/15/21  U/S Today:     24w 2d                                        EDD:   12/10/21  Best:          23w 4d     Det. By:  LMP  (03/10/21)          EDD:   12/15/21 ---------------------------------------------------------------------- Anatomy  Cranium:               Appears normal         Aortic Arch:            Previously seen  Cavum:                 Previously seen        Ductal Arch:            Appears normal  Ventricles:            Appears normal         Diaphragm:              Previously seen  Choroid Plexus:  Previously seen        Stomach:                Appears normal, left                                                                        sided  Cerebellum:            Previously seen        Abdomen:                Previously seen  Posterior Fossa:       Previously seen        Abdominal Wall:         Previously seen  Nuchal Fold:           Previously seen        Cord Vessels:           Previously seen  Face:                  Orbits and profile     Kidneys:                Appear normal                         previously seen  Lips:                  Previously seen        Bladder:                Appears normal  Thoracic:              Previously seen        Spine:                  Previously seen  Heart:                 Appears normal         Upper Extremities:      Previously seen                         (4CH, axis, and                         situs)  RVOT:                  Previously seen        Lower Extremities:      Appears normal  LVOT:                  Previously seen  Other:  Fetus appears to be a female. Heels and 5th digit previously visualized.          VC seen, 3VV and 3VTV previously visualized. ---------------------------------------------------------------------- Cervix Uterus Adnexa  Cervix  Length:            3.4  cm.  Normal  appearance by transabdominal scan.  Right Ovary  Not visualized.  Left Ovary  Not visualized. ---------------------------------------------------------------------- Impression  Patient returned for completion of fetal anatomy .Amniotic  fluid is normal  and good fetal activity is seen .Fetal biometry  is consistent with her previously-established dates .Fetal  anatomical survey was completed and appears normal.  Patient has symptoms of pelvic pressure since morning.  No  history of vaginal bleeding.  We performed transvaginal  ultrasound to evaluate the cervix.  The cervix measures 3.4  cm, which is within normal range.  We reassured the patient  of the findings.  She has a history of early miscarriages and on screening  found to have PAI-1 mutation.  She takes Lovenox and would  like to continue Lovenox throughout her pregnancy. ---------------------------------------------------------------------- Recommendations  -An appointment was made for her to return in 9 weeks for  fetal growth assessment. ----------------------------------------------------------------------                  Noralee Space, MD Electronically Signed Final Report   08/22/2021 05:09 pm ----------------------------------------------------------------------   Assessment and Plan:  Pregnancy: G3P0020 at [redacted]w[redacted]d 1. Encounter for supervision of high risk multigravida of advanced maternal age, antepartum Patient is doing well Third trimester labs and glucola next visit Patient is recovering from a cold- supportive care measures reviewed Precautions reviewed regarding pelvic pressure. Patient advised to come in the office later this week if symptoms worsen for cervical check  2. Homozygous for 5G insertion allele Continue lovenox Follow up growth ultrasound scheduled  Preterm labor symptoms and general obstetric precautions including but not limited to vaginal bleeding, contractions, leaking of fluid and fetal movement were reviewed in  detail with the patient. I discussed the assessment and treatment plan with the patient. The patient was provided an opportunity to ask questions and all were answered. The patient agreed with the plan and demonstrated an understanding of the instructions. The patient was advised to call back or seek an in-person office evaluation/go to [facility] at [facility] for any urgent or concerning symptoms. Please refer to After Visit Summary for other counseling recommendations.   I provided 15 minutes of face-to-face time during this encounter.  Return in about 3 weeks (around 09/22/2021) for in person, ROB, High risk, 2 hr glucola next visit.  Future Appointments  Date Time Provider Department Center  09/01/2021  3:10 PM Zahlia Deshazer, Gigi Gin, MD [facility]  09/25/2021  3:10 PM Milas Hock, MD [facility]  10/24/2021  2:30 PM [facility] NURSE [facility]  10/24/2021  2:45 PM [facility] US5 [facility]    Catalina Antigua, MD Center for [employer]

## 2021-09-02 ENCOUNTER — Other Ambulatory Visit (HOSPITAL_COMMUNITY): Payer: Self-pay

## 2021-09-03 ENCOUNTER — Other Ambulatory Visit (HOSPITAL_COMMUNITY): Payer: Self-pay

## 2021-09-08 ENCOUNTER — Encounter: Payer: Self-pay | Admitting: Internal Medicine

## 2021-09-11 ENCOUNTER — Other Ambulatory Visit: Payer: Self-pay | Admitting: *Deleted

## 2021-09-11 DIAGNOSIS — Z3402 Encounter for supervision of normal first pregnancy, second trimester: Secondary | ICD-10-CM

## 2021-09-19 ENCOUNTER — Ambulatory Visit (INDEPENDENT_AMBULATORY_CARE_PROVIDER_SITE_OTHER): Payer: BC Managed Care – PPO

## 2021-09-19 ENCOUNTER — Other Ambulatory Visit: Payer: Self-pay

## 2021-09-19 DIAGNOSIS — J029 Acute pharyngitis, unspecified: Secondary | ICD-10-CM | POA: Diagnosis not present

## 2021-09-19 DIAGNOSIS — Z3402 Encounter for supervision of normal first pregnancy, second trimester: Secondary | ICD-10-CM | POA: Diagnosis not present

## 2021-09-20 LAB — CBC
Hematocrit: 33.1 % — ABNORMAL LOW (ref 34.0–46.6)
Hemoglobin: 11.1 g/dL (ref 11.1–15.9)
MCH: 28.2 pg (ref 26.6–33.0)
MCHC: 33.5 g/dL (ref 31.5–35.7)
MCV: 84 fL (ref 79–97)
Platelets: 209 10*3/uL (ref 150–450)
RBC: 3.94 x10E6/uL (ref 3.77–5.28)
RDW: 12.3 % (ref 11.7–15.4)
WBC: 8.9 10*3/uL (ref 3.4–10.8)

## 2021-09-20 LAB — GLUCOSE TOLERANCE, 2 HOURS W/ 1HR
Glucose, 1 hour: 164 mg/dL (ref 70–179)
Glucose, 2 hour: 149 mg/dL (ref 70–152)
Glucose, Fasting: 87 mg/dL (ref 70–91)

## 2021-09-20 LAB — HIV ANTIBODY (ROUTINE TESTING W REFLEX): HIV Screen 4th Generation wRfx: NONREACTIVE

## 2021-09-20 LAB — RPR: RPR Ser Ql: NONREACTIVE

## 2021-09-21 NOTE — Progress Notes (Signed)
   PRENATAL VISIT NOTE  Subjective:  Julia Jimenez is a 35 y.o. G3P0020 at [redacted]w[redacted]d being seen today for ongoing prenatal care.  She is currently monitored for the following issues for this low-risk pregnancy and has Non-allergic rhinitis; Anxiety; Bursitis of shoulder, left; Family history of diabetes mellitus; Depression; Sleep difficulties; Chronic maxillary sinusitis; Lumbar radiculopathy; Chronic low back pain without sciatica; History of recurrent miscarriages; Encounter for supervision of high risk multigravida of advanced maternal age, antepartum; and Homozygous for 5G insertion allele on their problem list.  Patient reports  feeling contsant pressure that is worse at the end of the day. Feels like she has to pee frequently. No dysuria or hematuria.  .  Contractions: Not present. Vag. Bleeding: None.  Movement: Present. Denies leaking of fluid.   The following portions of the patient's history were reviewed and updated as appropriate: allergies, current medications, past family history, past medical history, past social history, past surgical history and problem list.   Objective:   Vitals:   09/25/21 1501  BP: 115/72  Pulse: (!) 101  Weight: 174 lb (78.9 kg)    Fetal Status: Fetal Heart Rate (bpm): 142 Fundal Height: 29 cm Movement: Present     General:  Alert, oriented and cooperative. Patient is in no acute distress.  Skin: Skin is warm and dry. No rash noted.   Cardiovascular: Normal heart rate noted  Respiratory: Normal respiratory effort, no problems with respiration noted  Abdomen: Soft, gravid, appropriate for gestational age.  Pain/Pressure: Present     Pelvic: Cervical exam performed in the presence of a chaperone Dilation: Closed Effacement (%): Thick Station: Ballotable  Extremities: Normal range of motion.  Edema: Trace  Mental Status: Normal mood and affect. Normal behavior. Normal judgment and thought content.   Assessment and Plan:  Pregnancy: G3P0020 at  [redacted]w[redacted]d 1. Encounter for supervision of high risk multigravida of advanced maternal age, antepartum - 28w labs wnl - Tdap today  - Rh positive - No evidence of PTL. Will check Ucx. UA negative. Suspect it is round ligament/fetal position. Reassurance provided but reviewed we will await ucx.  - Goal of delivery is healthy mom/baby. She does not care about MOD.   2. Homozygous for 5G insertion allele - Lovenox - Growth scheduled for 12/16 - Plan for 39w IOL - prefers Dr. Penne Lash or I if possible. Discussed scheduling at 36w visit to try to work this out.   Preterm labor symptoms and general obstetric precautions including but not limited to vaginal bleeding, contractions, leaking of fluid and fetal movement were reviewed in detail with the patient. Please refer to After Visit Summary for other counseling recommendations.   Return in about 2 weeks (around 10/09/2021) for Virtual OB Visit.  Future Appointments  Date Time Provider Department Center  10/09/2021  1:30 PM Milas Hock, MD [facility]  10/24/2021  2:30 PM [facility] NURSE [facility]  10/24/2021  2:45 PM [facility] US5 [facility]  10/27/2021  1:10 PM Penne Lash, Fredrich Romans, MD [facility]    Milas Hock, MD

## 2021-09-22 LAB — POCT RAPID STREP A (OFFICE): Rapid Strep A Screen: NEGATIVE

## 2021-09-25 ENCOUNTER — Ambulatory Visit (INDEPENDENT_AMBULATORY_CARE_PROVIDER_SITE_OTHER): Payer: BC Managed Care – PPO | Admitting: Obstetrics and Gynecology

## 2021-09-25 ENCOUNTER — Other Ambulatory Visit: Payer: Self-pay

## 2021-09-25 ENCOUNTER — Encounter: Payer: Self-pay | Admitting: Obstetrics and Gynecology

## 2021-09-25 VITALS — BP 115/72 | HR 101 | Wt 174.0 lb

## 2021-09-25 DIAGNOSIS — O09529 Supervision of elderly multigravida, unspecified trimester: Secondary | ICD-10-CM

## 2021-09-25 DIAGNOSIS — R350 Frequency of micturition: Secondary | ICD-10-CM

## 2021-09-25 DIAGNOSIS — Z23 Encounter for immunization: Secondary | ICD-10-CM | POA: Diagnosis not present

## 2021-09-25 DIAGNOSIS — Z1589 Genetic susceptibility to other disease: Secondary | ICD-10-CM

## 2021-09-25 DIAGNOSIS — Z3A28 28 weeks gestation of pregnancy: Secondary | ICD-10-CM

## 2021-09-25 LAB — POCT URINALYSIS DIPSTICK
Bilirubin, UA: NEGATIVE
Blood, UA: NEGATIVE
Glucose, UA: NEGATIVE
Ketones, UA: NEGATIVE
Leukocytes, UA: NEGATIVE
Nitrite, UA: NEGATIVE
Protein, UA: NEGATIVE
Spec Grav, UA: 1.01 (ref 1.010–1.025)
Urobilinogen, UA: 0.2 E.U./dL
pH, UA: 5 (ref 5.0–8.0)

## 2021-09-26 ENCOUNTER — Encounter: Payer: Self-pay | Admitting: Obstetrics and Gynecology

## 2021-09-27 LAB — URINE CULTURE, OB REFLEX: Organism ID, Bacteria: NO GROWTH

## 2021-09-27 LAB — CULTURE, OB URINE

## 2021-10-09 ENCOUNTER — Telehealth (INDEPENDENT_AMBULATORY_CARE_PROVIDER_SITE_OTHER): Payer: BC Managed Care – PPO | Admitting: Obstetrics and Gynecology

## 2021-10-09 ENCOUNTER — Other Ambulatory Visit: Payer: Self-pay

## 2021-10-09 ENCOUNTER — Encounter: Payer: Self-pay | Admitting: Obstetrics and Gynecology

## 2021-10-09 VITALS — BP 105/65 | Wt 174.0 lb

## 2021-10-09 DIAGNOSIS — O09529 Supervision of elderly multigravida, unspecified trimester: Secondary | ICD-10-CM

## 2021-10-09 DIAGNOSIS — Z1589 Genetic susceptibility to other disease: Secondary | ICD-10-CM

## 2021-10-09 DIAGNOSIS — O09523 Supervision of elderly multigravida, third trimester: Secondary | ICD-10-CM

## 2021-10-09 DIAGNOSIS — Z3A30 30 weeks gestation of pregnancy: Secondary | ICD-10-CM

## 2021-10-09 NOTE — Progress Notes (Signed)
OBSTETRICS PRENATAL VIRTUAL VISIT ENCOUNTER NOTE  Provider location: Center for [facility]   Patient location: Home  I connected with Shawnee Knapp on 10/09/21 at  1:30 PM EST by MyChart Video Encounter and verified that I am speaking with the correct person using two identifiers. I discussed the limitations, risks, security and privacy concerns of performing an evaluation and management service virtually and the availability of in person appointments. I also discussed with the patient that there may be a patient responsible charge related to this service. The patient expressed understanding and agreed to proceed. Subjective:  Julia Jimenez is a 35 y.o. G3P0020 at [redacted]w[redacted]d being seen today for ongoing prenatal care.  She is currently monitored for the following issues for this low-risk pregnancy and has Non-allergic rhinitis; Anxiety; Bursitis of shoulder, left; Family history of diabetes mellitus; Depression; Sleep difficulties; Chronic maxillary sinusitis; Lumbar radiculopathy; Chronic low back pain without sciatica; History of recurrent miscarriages; Encounter for supervision of high risk multigravida of advanced maternal age, antepartum; and Homozygous for 5G insertion allele on their problem list.  Patient reports  a nosebleed for 3.5 hours - resolved with afrin type spray. She does not use anything else routinely but feels like the air is dry and is impacting her.  She also notes continue pelvic pressure but worsens through the day and with walking .  Contractions: Not present. Vag. Bleeding: None.  Movement: Present. Denies any leaking of fluid.   The following portions of the patient's history were reviewed and updated as appropriate: allergies, current medications, past family history, past medical history, past social history, past surgical history and problem list.   Objective:   Vitals:   10/09/21 1320  BP: 105/65  Weight: 174 lb (78.9 kg)     Fetal Status:     Movement: Present     General:  Alert, oriented and cooperative. Patient is in no acute distress.  Respiratory: Normal respiratory effort, no problems with respiration noted  Mental Status: Normal mood and affect. Normal behavior. Normal judgment and thought content.  Rest of physical exam deferred due to type of encounter  Imaging: No results found.  Assessment and Plan:  Pregnancy: G3P0020 at [redacted]w[redacted]d 1. Homozygous for 5G insertion allele Continue Lovenox Next growth is 12/16 Plan will be for IOL on 1/30 unless indicated sooner medically. This will be 39w. Will schedule closer to to that time. She agrees with this plan.    2. Encounter for supervision of high risk multigravida of advanced maternal age, antepartum Normal 28w labs Ucx negative last visit - Discussed saline spray, humidifier and limited use of afrin although occasional use okay. If persistent nosebleed refractory to home measures, recommended mau.     Preterm labor symptoms and general obstetric precautions including but not limited to vaginal bleeding, contractions, leaking of fluid and fetal movement were reviewed in detail with the patient. I discussed the assessment and treatment plan with the patient. The patient was provided an opportunity to ask questions and all were answered. The patient agreed with the plan and demonstrated an understanding of the instructions. The patient was advised to call back or seek an in-person office evaluation/go to MAU at [facility] for any urgent or concerning symptoms. Please refer to After Visit Summary for other counseling recommendations.   I provided 7 minutes of face-to-face time during this encounter.  No follow-ups on file.  Future Appointments  Date Time Provider Department Center  10/24/2021  2:30  PM [facility] NURSE [facility]  10/24/2021  2:45 PM [facility] US5 [facility]  11/06/2021  9:30 AM Milas Hock, MD [facility]  [facility]    Milas Hock, MD Center for [facility]

## 2021-10-14 ENCOUNTER — Other Ambulatory Visit: Payer: Self-pay | Admitting: Family Medicine

## 2021-10-14 ENCOUNTER — Telehealth: Payer: Self-pay | Admitting: Obstetrics & Gynecology

## 2021-10-14 DIAGNOSIS — R040 Epistaxis: Secondary | ICD-10-CM

## 2021-10-14 NOTE — Telephone Encounter (Signed)
Julia Jimenez messaged that she is continuing to have multiple nose bleeds that last 1-2 hours at a time.   Direct pressure has not helped.  She is currently [redacted] weeks pregnant.  Pt needs urgent referral to ENT to avoid an ED visit and anemia.

## 2021-10-22 DIAGNOSIS — M545 Low back pain, unspecified: Secondary | ICD-10-CM | POA: Diagnosis not present

## 2021-10-22 DIAGNOSIS — M542 Cervicalgia: Secondary | ICD-10-CM | POA: Diagnosis not present

## 2021-10-24 ENCOUNTER — Ambulatory Visit: Payer: BC Managed Care – PPO | Admitting: *Deleted

## 2021-10-24 ENCOUNTER — Other Ambulatory Visit: Payer: Self-pay

## 2021-10-24 ENCOUNTER — Ambulatory Visit: Payer: BC Managed Care – PPO | Attending: Obstetrics and Gynecology

## 2021-10-24 VITALS — BP 114/69 | HR 97

## 2021-10-24 DIAGNOSIS — Z3A32 32 weeks gestation of pregnancy: Secondary | ICD-10-CM | POA: Diagnosis not present

## 2021-10-24 DIAGNOSIS — O09523 Supervision of elderly multigravida, third trimester: Secondary | ICD-10-CM | POA: Diagnosis not present

## 2021-10-24 DIAGNOSIS — O09529 Supervision of elderly multigravida, unspecified trimester: Secondary | ICD-10-CM | POA: Diagnosis not present

## 2021-10-24 DIAGNOSIS — Z362 Encounter for other antenatal screening follow-up: Secondary | ICD-10-CM

## 2021-10-27 ENCOUNTER — Telehealth: Payer: BC Managed Care – PPO | Admitting: Obstetrics & Gynecology

## 2021-10-27 ENCOUNTER — Other Ambulatory Visit: Payer: Self-pay | Admitting: *Deleted

## 2021-10-27 DIAGNOSIS — O09523 Supervision of elderly multigravida, third trimester: Secondary | ICD-10-CM

## 2021-11-05 NOTE — Progress Notes (Signed)
° °  PRENATAL VISIT NOTE  Subjective:  Julia Jimenez is a 35 y.o. G3P0020 at [redacted]w[redacted]d being seen today for ongoing prenatal care.  She is currently monitored for the following issues for this low-risk pregnancy and has Non-allergic rhinitis; Anxiety; Bursitis of shoulder, left; Family history of diabetes mellitus; Depression; Sleep difficulties; Chronic maxillary sinusitis; Lumbar radiculopathy; Chronic low back pain without sciatica; History of recurrent miscarriages; Encounter for supervision of high risk multigravida of advanced maternal age, antepartum; and Homozygous for 5G insertion allele on their problem list.  Patient reports no complaints.  Contractions: Not present. Vag. Bleeding: None.  Movement: Present. Denies leaking of fluid.   The following portions of the patient's history were reviewed and updated as appropriate: allergies, current medications, past family history, past medical history, past social history, past surgical history and problem list.   Objective:   Vitals:   11/06/21 0941  BP: 113/71  Pulse: (!) 117  Weight: 185 lb (83.9 kg)    Fetal Status: Fetal Heart Rate (bpm): 144 Fundal Height: 34 cm Movement: Present     General:  Alert, oriented and cooperative. Patient is in no acute distress.  Skin: Skin is warm and dry. No rash noted.   Cardiovascular: Normal heart rate noted  Respiratory: Normal respiratory effort, no problems with respiration noted  Abdomen: Soft, gravid, appropriate for gestational age.  Pain/Pressure: Absent     Pelvic: Cervical exam deferred        Extremities: Normal range of motion.  Edema: Trace  Mental Status: Normal mood and affect. Normal behavior. Normal judgment and thought content.   Assessment and Plan:  Pregnancy: G3P0020 at [redacted]w[redacted]d 1. Encounter for supervision of high risk multigravida of advanced maternal age, antepartum GBS/GC/CT next time   2. Homozygous for 5G insertion allele Continue Lovenox 12/16 growth was  85%ile, cephalic, normal afi 20.7.  Next growth is 1/13.  Plan will be for IOL on 1/30 unless indicated sooner medically or unless another date better with Dr. Penne Lash. This will be 39w. Will schedule closer to to that time. She agrees with this plan.   Preterm labor symptoms and general obstetric precautions including but not limited to vaginal bleeding, contractions, leaking of fluid and fetal movement were reviewed in detail with the patient. Please refer to After Visit Summary for other counseling recommendations.   Return in about 2 weeks (around 11/20/2021) for NSt next time, OB VISIT, MD or APP.  Future Appointments  Date Time Provider Department Center  11/17/2021  3:10 PM Lesly Dukes, MD [facility]  11/21/2021  3:30 PM [facility] NURSE [facility]  11/21/2021  3:45 PM [facility] US1 [facility]  11/24/2021  1:10 PM Reva Bores, MD [facility]  12/01/2021  3:10 PM Penne Lash, Fredrich Romans, MD [facility]    Milas Hock, MD

## 2021-11-06 ENCOUNTER — Other Ambulatory Visit: Payer: Self-pay

## 2021-11-06 ENCOUNTER — Ambulatory Visit (INDEPENDENT_AMBULATORY_CARE_PROVIDER_SITE_OTHER): Payer: BC Managed Care – PPO | Admitting: Obstetrics and Gynecology

## 2021-11-06 VITALS — BP 113/71 | HR 117 | Wt 185.0 lb

## 2021-11-06 DIAGNOSIS — O09529 Supervision of elderly multigravida, unspecified trimester: Secondary | ICD-10-CM

## 2021-11-06 DIAGNOSIS — Z1589 Genetic susceptibility to other disease: Secondary | ICD-10-CM

## 2021-11-17 ENCOUNTER — Other Ambulatory Visit: Payer: Self-pay

## 2021-11-17 ENCOUNTER — Encounter (HOSPITAL_COMMUNITY): Payer: Self-pay | Admitting: Obstetrics & Gynecology

## 2021-11-17 ENCOUNTER — Inpatient Hospital Stay (HOSPITAL_COMMUNITY)
Admission: AD | Admit: 2021-11-17 | Discharge: 2021-11-17 | Disposition: A | Discharge status: Home / Self Care | Payer: BC Managed Care – PPO | Attending: Family Medicine | Admitting: Family Medicine

## 2021-11-17 ENCOUNTER — Inpatient Hospital Stay (HOSPITAL_BASED_OUTPATIENT_CLINIC_OR_DEPARTMENT_OTHER): Payer: BC Managed Care – PPO

## 2021-11-17 ENCOUNTER — Ambulatory Visit (INDEPENDENT_AMBULATORY_CARE_PROVIDER_SITE_OTHER): Payer: BC Managed Care – PPO | Admitting: Obstetrics & Gynecology

## 2021-11-17 ENCOUNTER — Other Ambulatory Visit (HOSPITAL_COMMUNITY)
Admission: RE | Admit: 2021-11-17 | Discharge: 2021-11-17 | Disposition: A | Discharge status: Home / Self Care | Payer: BC Managed Care – PPO | Source: Ambulatory Visit | Attending: Obstetrics & Gynecology | Admitting: Obstetrics & Gynecology

## 2021-11-17 ENCOUNTER — Ambulatory Visit (INDEPENDENT_AMBULATORY_CARE_PROVIDER_SITE_OTHER): Payer: BC Managed Care – PPO | Admitting: *Deleted

## 2021-11-17 VITALS — BP 122/75 | HR 115 | Wt 187.0 lb

## 2021-11-17 DIAGNOSIS — O368130 Decreased fetal movements, third trimester, not applicable or unspecified: Secondary | ICD-10-CM | POA: Insufficient documentation

## 2021-11-17 DIAGNOSIS — O288 Other abnormal findings on antenatal screening of mother: Secondary | ICD-10-CM | POA: Diagnosis not present

## 2021-11-17 DIAGNOSIS — O368330 Maternal care for abnormalities of the fetal heart rate or rhythm, third trimester, not applicable or unspecified: Secondary | ICD-10-CM | POA: Insufficient documentation

## 2021-11-17 DIAGNOSIS — O3663X0 Maternal care for excessive fetal growth, third trimester, not applicable or unspecified: Secondary | ICD-10-CM | POA: Diagnosis not present

## 2021-11-17 DIAGNOSIS — O09529 Supervision of elderly multigravida, unspecified trimester: Secondary | ICD-10-CM | POA: Insufficient documentation

## 2021-11-17 DIAGNOSIS — O09523 Supervision of elderly multigravida, third trimester: Secondary | ICD-10-CM

## 2021-11-17 DIAGNOSIS — Z3A36 36 weeks gestation of pregnancy: Secondary | ICD-10-CM | POA: Insufficient documentation

## 2021-11-17 DIAGNOSIS — Z362 Encounter for other antenatal screening follow-up: Secondary | ICD-10-CM | POA: Diagnosis not present

## 2021-11-17 DIAGNOSIS — O368190 Decreased fetal movements, unspecified trimester, not applicable or unspecified: Secondary | ICD-10-CM

## 2021-11-17 DIAGNOSIS — O26843 Uterine size-date discrepancy, third trimester: Secondary | ICD-10-CM

## 2021-11-17 LAB — OB RESULTS CONSOLE GC/CHLAMYDIA: Gonorrhea: NEGATIVE

## 2021-11-17 LAB — GLUCOSE, CAPILLARY: Glucose-Capillary: 91 mg/dL (ref 70–99)

## 2021-11-17 MED ORDER — BLOOD GLUCOSE MONITOR SYSTEM W/DEVICE KIT
PACK | 0 refills | Status: DC
  Filled 2021-11-17: qty 1, fill #0
  Filled 2021-11-18: qty 1, 1d supply, fill #0

## 2021-11-17 NOTE — Progress Notes (Signed)
Nst is non reassuring per Dr Gala Romney.  Sent to MAU for BPP today.

## 2021-11-17 NOTE — MAU Note (Signed)
Presents stating she was sent from the office for a BPP secondary nonreactive NST at office today.  Reports decrease FM, states feels movement but less than usual.  Denies VB or LOF.  Also states needs glucose testing per MD at office visit.

## 2021-11-17 NOTE — Progress Notes (Signed)
° °  PRENATAL VISIT NOTE  Subjective:  Julia Jimenez is a 36 y.o. G3P0020 at 105w0d being seen today for ongoing prenatal care.  She is currently monitored for the following issues for this high-risk pregnancy and has Non-allergic rhinitis; Anxiety; Bursitis of shoulder, left; Family history of diabetes mellitus; Depression; Sleep difficulties; Chronic maxillary sinusitis; Lumbar radiculopathy; Chronic low back pain without sciatica; History of recurrent miscarriages; Encounter for supervision of high risk multigravida of advanced maternal age, antepartum; and Homozygous for 5G insertion allele on their problem list.  Patient reports  decreased fetal movement at times .  Contractions: Not present. Vag. Bleeding: None.  Movement: Present. Denies leaking of fluid.   The following portions of the patient's history were reviewed and updated as appropriate: allergies, current medications, past family history, past medical history, past social history, past surgical history and problem list.   Objective:   Vitals:   11/17/21 1508  BP: 122/75  Pulse: (!) 115  Weight: 187 lb (84.8 kg)    Fetal Status:     Movement: Present     General:  Alert, oriented and cooperative. Patient is in no acute distress.  Skin: Skin is warm and dry. No rash noted.   Cardiovascular: Normal heart rate noted  Respiratory: Normal respiratory effort, no problems with respiration noted  Abdomen: Soft, gravid, appropriate for gestational age.  Pain/Pressure: Present     Pelvic: Cervical exam performed in the presence of a chaperone      1/60/ballot  Extremities: Normal range of motion.  Edema: Mild pitting, slight indentation  Mental Status: Normal mood and affect. Normal behavior. Normal judgment and thought content.   Assessment and Plan:  Pregnancy: G3P0020 at [redacted]w[redacted]d 1. Encounter for supervision of high risk multigravida of advanced maternal age, antepartum NST Baseline:  145 Accelerations:  10 x 10s; on 15 x  15 Decelerations: absent Variability:  Moderate - Culture, beta strep (group b only) - Cervicovaginal ancillary only( [place]) - Glucose tolerance, 1 hour  Will send to MAU for BPP; hopefully include growth as it has been 3 weeks and she is going to MFM late this week.  Fundal height is 42 and feels like she could have polyhydramnios on [place].  Random CBG is 149 after cookies this afternoon.  2 hr GTT was borderline so will get one hour GCT; hopefully at MAU tonight.  If elevated then will start checking CBGs and low card diet.   Preterm labor symptoms and general obstetric precautions including but not limited to vaginal bleeding, contractions, leaking of fluid and fetal movement were reviewed in detail with the patient. Please refer to After Visit Summary for other counseling recommendations.   No follow-ups on file.  Future Appointments  Date Time Provider [place]  11/21/2021  3:30 PM [facility] NURSE [facility]  11/21/2021  3:45 PM [facility] US1 [facility]  11/24/2021  1:10 PM Donnamae Jude, MD [facility]  12/01/2021  3:10 PM Gala Romney, Fredderick Phenix, MD [facility]    Silas [place], MD

## 2021-11-17 NOTE — MAU Note (Signed)
TEACHING ON the use of Glucometer: I explained how to clean her finger, wipe away first blood drop, be mindful of using side of finger instead of finger tip. TOLD  her to read [facility] of how and when to change needle, make sure she keep up with the HIGHS, LOWS and the NORMAL reading.

## 2021-11-17 NOTE — MAU Provider Note (Signed)
History     CSN: [id number]  Arrival date and time: 11/17/21 1639   Event Date/Time   First Provider Initiated Contact with Patient 11/17/21 1735      Chief Complaint  Patient presents with   Nonreactive NST    HPI This is a 36 yo G3P0020 at [redacted]w[redacted]d with history of AMA and PAI mutation. She was seen in the office today for a routine office visit with an NST due to decreased fetal movement. The NST was nonreactive. The provider put on an [place] and she had subjectively increased amniotic fluid. The fundal height is measuring larger than dates. She was sent here for further evaluation.   OB History     Gravida  3   Para  0   Term  0   Preterm  0   AB  2   Living  0      SAB  2   IAB  0   Ectopic  0   Multiple  0   Live Births              Past Medical History:  Diagnosis Date   Allergic rhinitis    Anemia    in high school   Anxiety    Elevated blood pressure reading without diagnosis of hypertension    in context of Adderall initiation   History of chicken pox    Infertility, female    Ovarian cyst, right 2014    Past Surgical History:  Procedure Laterality Date   CHOLECYSTECTOMY, LAPAROSCOPIC     TONSILLECTOMY AND ADENOIDECTOMY     WISDOM TOOTH EXTRACTION      Family History  Problem Relation Age of Onset   Hypertension Mother    Depression Mother    Anxiety disorder Mother    Diabetes Father    Hypertension Brother    Depression Brother    Diabetes Maternal Aunt    Stroke Maternal Uncle    Diabetes Maternal Uncle        x2   Diabetes Paternal Grandmother    Heart attack Paternal Grandmother    Cancer Maternal Grandfather        Dec CA of unknown origin   Hepatitis C Maternal Grandmother        Dec    Social History   Tobacco Use   Smoking status: Never   Smokeless tobacco: Never  Vaping Use   Vaping Use: Never used  Substance Use Topics   Alcohol use: Not Currently    Alcohol/week: 2.0 standard drinks    Types: 2 Standard  drinks or equivalent per week    Comment: occasionally   Drug use: No    Allergies: No Known Allergies  Medications Prior to Admission  Medication Sig Dispense Refill Last Dose   doxylamine, Sleep, (UNISOM) 25 MG tablet Take 25 mg by mouth at bedtime as needed.   11/16/2021   enoxaparin (LOVENOX) 40 MG/0.4ML injection Inject 0.4 mLs (40 mg total) into the skin daily. 36 mL 4 11/16/2021   Prenatal Vit-Fe Fumarate-FA (PRENATAL MULTIVITAMIN) TABS tablet Take 1 tablet by mouth daily at 12 noon.   11/16/2021   sertraline (ZOLOFT) 100 MG tablet TAKE 1 TABLET(100 MG) BY MOUTH DAILY 90 tablet 1 11/16/2021   cyclobenzaprine (FLEXERIL) 5 MG tablet TAKE 1 TABLET(5 MG) BY MOUTH AT BEDTIME AS NEEDED FOR MUSCLE SPASMS. STOP IMMEDIATELY IF PREGNANT 30 tablet 2 More than a month   Insulin Syringe-Needle U-100 30G X 5/16" 1 ML MISC Use  as directed twice daily. 60 each 3     Review of Systems Physical Exam   Blood pressure 128/89, pulse (!) 116, temperature 98.8 F (37.1 C), temperature source Oral, resp. rate 20, height 5\' 2"  (1.575 m), weight 85 kg, last menstrual period 03/10/2021, SpO2 98 %.  Physical Exam Vitals reviewed.  Constitutional:      Appearance: Normal appearance.  Cardiovascular:     Rate and Rhythm: Normal rate and regular rhythm.  Pulmonary:     Effort: Pulmonary effort is normal.     Breath sounds: Normal breath sounds.  Abdominal:     General: There is no distension.     Palpations: Abdomen is soft.     Tenderness: There is no abdominal tenderness.  Skin:    General: Skin is warm and dry.     Capillary Refill: Capillary refill takes less than 2 seconds.  Neurological:     General: No focal deficit present.     Mental Status: She is alert.  Psychiatric:        Mood and Affect: Mood normal.        Behavior: Behavior normal.        Thought Content: Thought content normal.        Judgment: Judgment normal.    MAU Course  Procedures NST:  Baseline: 130  Variability:  moderate Accelerations: acceleration  Decelerations:  Contractions: irregular   MDM Imaging: I independent reviewed the images of the [place]. BPP 8/8. AFI 23cm. EFW 3826g (99%tile)   Assessment and Plan   1. Encounter for supervision of high risk multigravida of advanced maternal age, antepartum   2. Decreased fetal movement   3. [redacted] weeks gestation of pregnancy   4. Decreased fetal movements in third trimester, single or unspecified fetus   5. Macrosomia of fetus affecting management of mother in third trimester, single or unspecified fetus    Discharge to home.  Discussed macrosomia, kick counts. With macrosomia, primary OB contemplated repeating 1hr GTT - will have patient check CBGs for next week instead.   Contemplating delivery at 39wks.  Truett Mainland 11/17/2021, 5:35 PM

## 2021-11-18 ENCOUNTER — Other Ambulatory Visit (HOSPITAL_COMMUNITY): Payer: Self-pay

## 2021-11-18 ENCOUNTER — Other Ambulatory Visit: Payer: Self-pay | Admitting: Obstetrics & Gynecology

## 2021-11-18 DIAGNOSIS — O26843 Uterine size-date discrepancy, third trimester: Secondary | ICD-10-CM

## 2021-11-18 LAB — CERVICOVAGINAL ANCILLARY ONLY
Chlamydia: NEGATIVE
Comment: NEGATIVE
Comment: NORMAL
Neisseria Gonorrhea: NEGATIVE

## 2021-11-18 MED ORDER — GLUCOSE BLOOD VI STRP
ORAL_STRIP | 0 refills | Status: DC
  Filled 2021-11-18: qty 50, 13d supply, fill #0

## 2021-11-18 MED ORDER — ONETOUCH DELICA PLUS LANCET33G MISC
0 refills | Status: DC
  Filled 2021-11-18: qty 100, 25d supply, fill #0

## 2021-11-18 NOTE — Progress Notes (Unsigned)
Growth [place] 3 weeks after last growth to determine route of delivery.

## 2021-11-20 LAB — CULTURE, BETA STREP (GROUP B ONLY)
MICRO NUMBER:: 12849532
SPECIMEN QUALITY:: ADEQUATE

## 2021-11-21 ENCOUNTER — Ambulatory Visit: Payer: BC Managed Care – PPO

## 2021-11-24 ENCOUNTER — Telehealth (INDEPENDENT_AMBULATORY_CARE_PROVIDER_SITE_OTHER): Payer: BC Managed Care – PPO | Admitting: Obstetrics & Gynecology

## 2021-11-24 ENCOUNTER — Encounter: Payer: Self-pay | Admitting: Obstetrics & Gynecology

## 2021-11-24 DIAGNOSIS — O24419 Gestational diabetes mellitus in pregnancy, unspecified control: Secondary | ICD-10-CM | POA: Insufficient documentation

## 2021-11-24 DIAGNOSIS — O09529 Supervision of elderly multigravida, unspecified trimester: Secondary | ICD-10-CM

## 2021-11-24 DIAGNOSIS — Z3A37 37 weeks gestation of pregnancy: Secondary | ICD-10-CM

## 2021-11-24 DIAGNOSIS — O09523 Supervision of elderly multigravida, third trimester: Secondary | ICD-10-CM

## 2021-11-24 DIAGNOSIS — O3663X1 Maternal care for excessive fetal growth, third trimester, fetus 1: Secondary | ICD-10-CM | POA: Insufficient documentation

## 2021-11-24 NOTE — Progress Notes (Signed)
OBSTETRICS PRENATAL VIRTUAL VISIT ENCOUNTER NOTE  Provider location: [facility]   Patient location: Home  I connected with Blair Promise on 11/24/21 at  1:10 PM EST by MyChart Video Encounter and verified that I am speaking with the correct person using two identifiers. I discussed the limitations, risks, security and privacy concerns of performing an evaluation and management service virtually and the availability of in person appointments. I also discussed with the patient that there may be a patient responsible charge related to this service. The patient expressed understanding and agreed to proceed. Subjective:  [name] is a 36 y.o. G3P0020 at [redacted]w[redacted]d being seen today for ongoing prenatal care.  She is currently monitored for the following issues for this high-risk pregnancy and has Non-allergic rhinitis; Anxiety; Bursitis of shoulder, left; Family history of diabetes mellitus; Depression; Sleep difficulties; Chronic maxillary sinusitis; Lumbar radiculopathy; Chronic low back pain without sciatica; History of recurrent miscarriages; Encounter for supervision of high risk multigravida of advanced maternal age, antepartum; Homozygous for 5G insertion allele; Large for gestational age fetus affecting management of mother, antepartum, third trimester, fetus 1; and Gestational diabetes mellitus (GDM) affecting pregnancy, antepartum on their problem list.  Patient reports pelvic pressure, especially with walking.  Good movement and has rare non painful contraction.  Denies leaking of fluid.   The following portions of the patient's history were reviewed and updated as appropriate: allergies, current medications, past family history, past medical history, past social history, past surgical history and problem list.   Objective:  There were no vitals filed for this visit.  Fetal Status:           General:  Alert, oriented and cooperative.  Patient is in no acute distress.  Respiratory: Normal respiratory effort, no problems with respiration noted  Mental Status: Normal mood and affect. Normal behavior. Normal judgment and thought content.  Rest of physical exam deferred due to type of encounter  Imaging: [place] MFM FETAL BPP WO NON STRESS  Result Date: 11/18/2021 ----------------------------------------------------------------------  OBSTETRICS REPORT                       (Signed Final 11/18/2021 08:29 am) ---------------------------------------------------------------------- Patient Info  ID #:       [id number]                          D.O.B.:  [birth year] (35 yrs)  Name:       Julia Jimenez             Visit Date: 11/17/2021 05:32 pm ---------------------------------------------------------------------- Performed By  Attending:        Sander Nephew      Ref. Address:     [address]  [place], [place]  Performed By:     Valda Favia          [facility]  Referred By:      Osborne Oman MD ---------------------------------------------------------------------- Orders  #  Description                           Code        Ordered By  1  [place] MFM FETAL BPP WO NON               [id number]    JACOB STINSON     STRESS ----------------------------------------------------------------------  #  Order #                     Accession #                Episode #  1  [id number]                   [id number]                 [id number] ---------------------------------------------------------------------- Indications  Non-reactive NST                               O28.9  [redacted] weeks gestation of pregnancy                Z3A.24  Advanced  maternal age multigravida 54+,        O4.523  third trimester  Medical complication of pregnancy -            O26.90  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 Cord Insertion:      Visualized, central  Amniotic Fluid  AFI FV:      Subjectively increased  AFI Sum(cm)     %Tile       Largest Pocket(cm)  23.3            89          8.4  RUQ(cm)       RLQ(cm)  LUQ(cm)        LLQ(cm)  6.9           8.4           3.2            4.8 ---------------------------------------------------------------------- Biophysical Evaluation  Amniotic F.V:   Within normal limits       F. Tone:        Observed  F. Movement:    Observed                   Score:          8/8  F. Breathing:   Observed ---------------------------------------------------------------------- Biometry  BPD:      98.5  mm     G. Age:  40w 3d       > 99  %    CI:        77.89   %    70 - 86                                                          FL/HC:      19.4   %    20.1 - 22.1  HC:      353.2  mm     G. Age:  41w 2d       > 99  %    HC/AC:      0.96        0.93 - 1.11  AC:      366.4  mm     G. Age:  40w 4d       > 99  %    FL/BPD:     69.6   %    71 - 87  FL:       68.6  mm     G. Age:  35w 2d         26  %    FL/AC:      18.7   %    20 - 24  Est. FW:    3826  gm      8 lb 7 oz   > 99  % ---------------------------------------------------------------------- OB History  Gravidity:    3          SAB:   2  Living:       0 ---------------------------------------------------------------------- Gestational Age  LMP:           36w 0d        Date:  03/10/21                 EDD:   12/15/21  U/S Today:     39w 3d                                        EDD:    11/21/21  Best:          36w 0d     Det. By:  LMP  (03/10/21)          EDD:   12/15/21 ---------------------------------------------------------------------- Anatomy  Cranium:               Appears normal  Aortic Arch:            Previously seen  Cavum:                 Previously seen        Ductal Arch:            Previously seen  Ventricles:            Previously seen        Diaphragm:              Previously seen  Choroid Plexus:        Previously seen        Stomach:                Appears normal, left                                                                        sided  Cerebellum:            Previously seen        Abdomen:                Previously seen  Posterior Fossa:       Previously seen        Abdominal Wall:         Previously seen  Nuchal Fold:           Previously seen        Cord Vessels:           Previously seen  Face:                  Appears normal         Kidneys:                Appear normal                         (orbits and profile)  Lips:                  Previously seen        Bladder:                Appears normal  Thoracic:              Previously seen        Spine:                  Previously seen  Heart:                 Appears normal         Upper Extremities:      Previously seen                         (4CH, axis, and                         situs)  RVOT:                  Previously seen        Lower Extremities:      Previously  seen  LVOT:                  Previously seen  Other:  Female gender previously visualized. Heels and 5th digit previously          visualized. VC, 3VV and 3VTV previously visualized. Nasal bone          visualized. ---------------------------------------------------------------------- Cervix Uterus Adnexa  Cervix  Not visualized (advanced GA >24wks)  Uterus  No abnormality visualized.  Right Ovary  No adnexal mass visualized.  Left Ovary  No adnexal mass visualized.  Cul De Sac  No free fluid seen.  Adnexa  No abnormality visualized.  ---------------------------------------------------------------------- Impression  Follow up growth due to AMA and NRNST  Normal interval growth with measurements consistent with  large for gestational age.  Good fetal movement and amniotic fluid volume  Biophysical profile 8/8 ---------------------------------------------------------------------- Recommendations  Follow up as clinically indicated. ----------------------------------------------------------------------               Sander Nephew, MD Electronically Signed Final Report   11/18/2021 08:29 am ----------------------------------------------------------------------   Assessment and Plan:  Pregnancy: [id number] at [redacted]w[redacted]d 1. Encounter for supervision of high risk multigravida of advanced maternal age, antepartum Stopped Lovenox for PAI -1   2. Large for gestational age fetus affecting management of mother, antepartum, third trimester, fetus 1 Ultrasound 12/08/21; Pt may opt for primary c section given LGA and GDM.  Will make final decision based on [place].    3. Gestational diabetes mellitus (GDM) affecting pregnancy, antepartum One elvated fasting and some elevated pp with higher carb foods.  Can be controlled with diet.  She will continue to check 4x a day.  Keeanna understands the improtance of good glycemic control for neonatal health.   Term labor symptoms and general obstetric precautions including but not limited to vaginal bleeding, contractions, leaking of fluid and fetal movement were reviewed in detail with the patient. Please refer to After Visit Summary for other counseling recommendations.   I discussed the assessment and treatment plan with the patient. The patient was provided an opportunity to ask questions and all were answered. The patient agreed with the plan and demonstrated an understanding of the instructions. The patient was advised to call back or seek an in-person office evaluation/go to [facility]U at [facility]  for any urgent or concerning symptoms.   I provided 20 minutes of face-to-face time during this encounter.  Return in about 1 week (around 12/01/2021).  Future Appointments  Date Time Provider [place]  12/01/2021  3:10 PM Guss Bunde, MD [facility]  12/08/2021 10:45 AM [facility] NURSE [facility]  12/08/2021 11:00 AM [facility] US1 [facility] [place]    Silas Sacramento, MD [facility] for [facility] [place]

## 2021-11-27 ENCOUNTER — Telehealth (HOSPITAL_COMMUNITY): Payer: Self-pay | Admitting: *Deleted

## 2021-11-27 NOTE — Telephone Encounter (Signed)
Preadmission screen  

## 2021-11-28 ENCOUNTER — Encounter (HOSPITAL_COMMUNITY): Payer: Self-pay

## 2021-11-28 NOTE — Patient Instructions (Addendum)
Julia Jimenez  11/28/2021   Your procedure is scheduled on:  12/09/2021  Arrive at 1100 at Entrance C on [address] at [facility]  and [employer]. You are invited to use the FREE valet parking or use the Visitor's parking deck.  Pick up the phone at the desk and dial [phone].  Call this number if you have problems the morning of surgery: [phone]  Remember:   Do not eat food:(After Midnight) Desps de medianoche.  Do not drink clear liquids: (After Midnight) Desps de medianoche.  Take these medicines the morning of surgery with A SIP OF WATER:  none   Do not wear jewelry, make-up or nail polish.  Do not wear lotions, powders, or perfumes. Do not wear deodorant.  Do not shave 48 hours prior to surgery.  Do not bring valuables to the hospital.  [facility] is not   responsible for any belongings or valuables brought to the hospital.  Contacts, dentures or bridgework may not be worn into surgery.  Leave suitcase in the car. After surgery it may be brought to your room.  For patients admitted to the hospital, checkout time is 11:00 AM the day of              discharge.      Please read over the following fact sheets that you were given:     Preparing for Surgery

## 2021-12-01 ENCOUNTER — Ambulatory Visit (INDEPENDENT_AMBULATORY_CARE_PROVIDER_SITE_OTHER): Payer: BC Managed Care – PPO | Admitting: Obstetrics & Gynecology

## 2021-12-01 ENCOUNTER — Ambulatory Visit: Payer: BC Managed Care – PPO | Admitting: *Deleted

## 2021-12-01 ENCOUNTER — Other Ambulatory Visit: Payer: Self-pay

## 2021-12-01 VITALS — BP 129/79 | HR 105 | Wt 195.0 lb

## 2021-12-01 DIAGNOSIS — O09529 Supervision of elderly multigravida, unspecified trimester: Secondary | ICD-10-CM | POA: Diagnosis not present

## 2021-12-01 DIAGNOSIS — O24419 Gestational diabetes mellitus in pregnancy, unspecified control: Secondary | ICD-10-CM

## 2021-12-01 DIAGNOSIS — O3663X1 Maternal care for excessive fetal growth, third trimester, fetus 1: Secondary | ICD-10-CM

## 2021-12-01 NOTE — Progress Notes (Signed)
° °  PRENATAL VISIT NOTE  Subjective:  Julia Jimenez is a 36 y.o. G3P0020 at [redacted]w[redacted]d being seen today for ongoing prenatal care.  She is currently monitored for the following issues for this high-risk pregnancy and has Non-allergic rhinitis; Anxiety; Bursitis of shoulder, left; Family history of diabetes mellitus; Depression; Sleep difficulties; Chronic maxillary sinusitis; Lumbar radiculopathy; Chronic low back pain without sciatica; History of recurrent miscarriages; Encounter for supervision of high risk multigravida of advanced maternal age, antepartum; Homozygous for 5G insertion allele; Large for gestational age fetus affecting management of mother, antepartum, third trimester, fetus 1; and Gestational diabetes mellitus (GDM) affecting pregnancy, antepartum on their problem list.  Patient reports  pelvic pain and pressure .  Contractions: Regular. Vag. Bleeding: None.  Movement: Present. Denies leaking of fluid.   The following portions of the patient's history were reviewed and updated as appropriate: allergies, current medications, past family history, past medical history, past social history, past surgical history and problem list.   Objective:   Vitals:   12/01/21 1511  BP: 129/79  Pulse: (!) 105  Weight: 195 lb (88.5 kg)    Fetal Status: Fetal Heart Rate (bpm): 154   Movement: Present     General:  Alert, oriented and cooperative. Patient is in no acute distress.  Skin: Skin is warm and dry. No rash noted.   Cardiovascular: Normal heart rate noted  Respiratory: Normal respiratory effort, no problems with respiration noted  Abdomen: Soft, gravid, appropriate for gestational age.  Pain/Pressure: Present     Pelvic: Cervical exam performed in the presence of a chaperone      1/70/ballotable  Extremities: Normal range of motion.  Edema: Mild pitting, slight indentation  Mental Status: Normal mood and affect. Normal behavior. Normal judgment and thought content.   Assessment  and Plan:  Pregnancy: G3P0020 at [redacted]w[redacted]d 1. Encounter for supervision of high risk multigravida of advanced maternal age, antepartum Given the large amount of amniotic fluid, Maisie has problems assessing fetal movement.  NST done today. Baseline:  150 Accelerations:  Present Decelerations:  Absent Variability:  Moderate Interpretation:  Reactive NST   2. Gestational diabetes mellitus (GDM) affecting pregnancy, antepartum Fasting CBGs are from 60 to low 90s.  Postprandials are normal except when diet is not followed.  3. Large for gestational age fetus affecting management of mother, antepartum, third trimester, fetus 1 [place] Friday and will determine route of delivery  Term labor symptoms and general obstetric precautions including but not limited to vaginal bleeding, contractions, leaking of fluid and fetal movement were reviewed in detail with the patient. Please refer to After Visit Summary for other counseling recommendations.   No follow-ups on file.  Future Appointments  Date Time Provider Department Center  12/08/2021 10:45 AM [facility] NURSE [facility]  12/08/2021 11:00 AM [facility] US1 [facility]  12/08/2021 12:00 PM [facility] PAT 1 [facility]IN[facility] None    [name], MD

## 2021-12-01 NOTE — Progress Notes (Signed)
NST Reactive

## 2021-12-05 ENCOUNTER — Ambulatory Visit (INDEPENDENT_AMBULATORY_CARE_PROVIDER_SITE_OTHER): Payer: BC Managed Care – PPO | Admitting: *Deleted

## 2021-12-05 ENCOUNTER — Other Ambulatory Visit: Payer: Self-pay

## 2021-12-05 VITALS — BP 127/79 | HR 102 | Wt 191.0 lb

## 2021-12-05 DIAGNOSIS — O24419 Gestational diabetes mellitus in pregnancy, unspecified control: Secondary | ICD-10-CM

## 2021-12-05 NOTE — Progress Notes (Signed)
NST-R  Pt scheduled for C Section 12/09/21.

## 2021-12-08 ENCOUNTER — Ambulatory Visit: Payer: BC Managed Care – PPO

## 2021-12-08 ENCOUNTER — Other Ambulatory Visit (HOSPITAL_COMMUNITY)
Admission: RE | Admit: 2021-12-08 | Discharge: 2021-12-08 | Disposition: A | Discharge status: Home / Self Care | Payer: BC Managed Care – PPO | Source: Ambulatory Visit | Attending: Obstetrics & Gynecology | Admitting: Obstetrics & Gynecology

## 2021-12-08 ENCOUNTER — Other Ambulatory Visit: Payer: Self-pay

## 2021-12-08 DIAGNOSIS — O09523 Supervision of elderly multigravida, third trimester: Secondary | ICD-10-CM | POA: Insufficient documentation

## 2021-12-08 DIAGNOSIS — O3663X0 Maternal care for excessive fetal growth, third trimester, not applicable or unspecified: Secondary | ICD-10-CM | POA: Diagnosis not present

## 2021-12-08 DIAGNOSIS — O26843 Uterine size-date discrepancy, third trimester: Secondary | ICD-10-CM | POA: Insufficient documentation

## 2021-12-08 DIAGNOSIS — M25512 Pain in left shoulder: Secondary | ICD-10-CM | POA: Diagnosis not present

## 2021-12-08 DIAGNOSIS — Z23 Encounter for immunization: Secondary | ICD-10-CM | POA: Diagnosis not present

## 2021-12-08 DIAGNOSIS — O24420 Gestational diabetes mellitus in childbirth, diet controlled: Secondary | ICD-10-CM | POA: Diagnosis not present

## 2021-12-08 DIAGNOSIS — F32A Depression, unspecified: Secondary | ICD-10-CM | POA: Diagnosis not present

## 2021-12-08 DIAGNOSIS — O24419 Gestational diabetes mellitus in pregnancy, unspecified control: Secondary | ICD-10-CM | POA: Insufficient documentation

## 2021-12-08 DIAGNOSIS — O99893 Other specified diseases and conditions complicating puerperium: Secondary | ICD-10-CM | POA: Diagnosis not present

## 2021-12-08 DIAGNOSIS — Z3A00 Weeks of gestation of pregnancy not specified: Secondary | ICD-10-CM | POA: Insufficient documentation

## 2021-12-08 DIAGNOSIS — Z20822 Contact with and (suspected) exposure to covid-19: Secondary | ICD-10-CM | POA: Diagnosis not present

## 2021-12-08 DIAGNOSIS — O99344 Other mental disorders complicating childbirth: Secondary | ICD-10-CM | POA: Diagnosis not present

## 2021-12-08 DIAGNOSIS — O3663X1 Maternal care for excessive fetal growth, third trimester, fetus 1: Secondary | ICD-10-CM | POA: Diagnosis not present

## 2021-12-08 DIAGNOSIS — Z298 Encounter for other specified prophylactic measures: Secondary | ICD-10-CM | POA: Diagnosis not present

## 2021-12-08 DIAGNOSIS — O9081 Anemia of the puerperium: Secondary | ICD-10-CM | POA: Diagnosis not present

## 2021-12-08 DIAGNOSIS — Z3A39 39 weeks gestation of pregnancy: Secondary | ICD-10-CM | POA: Diagnosis not present

## 2021-12-08 DIAGNOSIS — D62 Acute posthemorrhagic anemia: Secondary | ICD-10-CM | POA: Diagnosis not present

## 2021-12-08 HISTORY — DX: Gestational diabetes mellitus in pregnancy, unspecified control: O24.419

## 2021-12-08 LAB — CBC
HCT: 37.7 % (ref 36.0–46.0)
Hemoglobin: 12.5 g/dL (ref 12.0–15.0)
MCH: 27.2 pg (ref 26.0–34.0)
MCHC: 33.2 g/dL (ref 30.0–36.0)
MCV: 82.1 fL (ref 80.0–100.0)
Platelets: 160 10*3/uL (ref 150–400)
RBC: 4.59 MIL/uL (ref 3.87–5.11)
RDW: 13.7 % (ref 11.5–15.5)
WBC: 9.4 10*3/uL (ref 4.0–10.5)
nRBC: 0 % (ref 0.0–0.2)

## 2021-12-08 LAB — TYPE AND SCREEN
ABO/RH(D): A POS
Antibody Screen: NEGATIVE

## 2021-12-09 ENCOUNTER — Inpatient Hospital Stay (HOSPITAL_COMMUNITY)
Admission: RE | Admit: 2021-12-09 | Discharge: 2021-12-11 | DRG: 787 | Disposition: A | Discharge status: Home / Self Care | Payer: BC Managed Care – PPO | Attending: Obstetrics & Gynecology | Admitting: Obstetrics & Gynecology

## 2021-12-09 ENCOUNTER — Inpatient Hospital Stay (HOSPITAL_COMMUNITY): Payer: BC Managed Care – PPO | Admitting: Anesthesiology

## 2021-12-09 ENCOUNTER — Encounter (HOSPITAL_COMMUNITY)
Admission: RE | Disposition: A | Discharge status: Home / Self Care | Payer: Self-pay | Source: Home / Self Care | Attending: Obstetrics & Gynecology

## 2021-12-09 ENCOUNTER — Other Ambulatory Visit: Payer: Self-pay

## 2021-12-09 ENCOUNTER — Encounter (HOSPITAL_COMMUNITY): Payer: Self-pay | Admitting: Obstetrics & Gynecology

## 2021-12-09 DIAGNOSIS — O24420 Gestational diabetes mellitus in childbirth, diet controlled: Secondary | ICD-10-CM | POA: Diagnosis present

## 2021-12-09 DIAGNOSIS — O9081 Anemia of the puerperium: Secondary | ICD-10-CM | POA: Diagnosis not present

## 2021-12-09 DIAGNOSIS — D62 Acute posthemorrhagic anemia: Secondary | ICD-10-CM | POA: Diagnosis not present

## 2021-12-09 DIAGNOSIS — O24419 Gestational diabetes mellitus in pregnancy, unspecified control: Secondary | ICD-10-CM | POA: Diagnosis present

## 2021-12-09 DIAGNOSIS — O3663X0 Maternal care for excessive fetal growth, third trimester, not applicable or unspecified: Secondary | ICD-10-CM | POA: Diagnosis present

## 2021-12-09 DIAGNOSIS — Z20822 Contact with and (suspected) exposure to covid-19: Secondary | ICD-10-CM | POA: Diagnosis present

## 2021-12-09 DIAGNOSIS — M25512 Pain in left shoulder: Secondary | ICD-10-CM | POA: Diagnosis not present

## 2021-12-09 DIAGNOSIS — O99344 Other mental disorders complicating childbirth: Secondary | ICD-10-CM | POA: Diagnosis present

## 2021-12-09 DIAGNOSIS — F32A Depression, unspecified: Secondary | ICD-10-CM | POA: Diagnosis present

## 2021-12-09 DIAGNOSIS — O09529 Supervision of elderly multigravida, unspecified trimester: Secondary | ICD-10-CM

## 2021-12-09 DIAGNOSIS — Z3A39 39 weeks gestation of pregnancy: Secondary | ICD-10-CM

## 2021-12-09 DIAGNOSIS — O99893 Other specified diseases and conditions complicating puerperium: Secondary | ICD-10-CM | POA: Diagnosis not present

## 2021-12-09 DIAGNOSIS — O3663X1 Maternal care for excessive fetal growth, third trimester, fetus 1: Secondary | ICD-10-CM | POA: Diagnosis present

## 2021-12-09 DIAGNOSIS — N96 Recurrent pregnancy loss: Secondary | ICD-10-CM | POA: Diagnosis present

## 2021-12-09 DIAGNOSIS — Z1589 Genetic susceptibility to other disease: Secondary | ICD-10-CM

## 2021-12-09 LAB — RESP PANEL BY RT-PCR (FLU A&B, COVID) ARPGX2
Influenza A by PCR: NEGATIVE
Influenza B by PCR: NEGATIVE
SARS Coronavirus 2 by RT PCR: NEGATIVE

## 2021-12-09 LAB — RPR: RPR Ser Ql: NONREACTIVE

## 2021-12-09 LAB — GLUCOSE, CAPILLARY
Glucose-Capillary: 82 mg/dL (ref 70–99)
Glucose-Capillary: 90 mg/dL (ref 70–99)

## 2021-12-09 SURGERY — Surgical Case
Anesthesia: Spinal | Wound class: Clean Contaminated

## 2021-12-09 MED ORDER — PROMETHAZINE HCL 25 MG/ML IJ SOLN: 6.2500 mg | INTRAMUSCULAR | Status: DC | PRN

## 2021-12-09 MED ORDER — STERILE WATER FOR IRRIGATION IR SOLN
Status: DC | PRN
  Administered 2021-12-09: 1000 mL

## 2021-12-09 MED ORDER — GABAPENTIN 300 MG PO CAPS
300.0000 mg | ORAL_CAPSULE | ORAL | Status: AC
  Administered 2021-12-09: 300 mg via ORAL

## 2021-12-09 MED ORDER — ENOXAPARIN SODIUM 40 MG/0.4ML IJ SOSY
40.0000 mg | PREFILLED_SYRINGE | INTRAMUSCULAR | Status: DC
  Administered 2021-12-10 – 2021-12-11 (×2): 40 mg via SUBCUTANEOUS
  Filled 2021-12-09 (×2): qty 0.4

## 2021-12-09 MED ORDER — ONDANSETRON HCL 4 MG/2ML IJ SOLN: 4.0000 mg | Freq: Three times a day (TID) | INTRAMUSCULAR | Status: DC | PRN

## 2021-12-09 MED ORDER — PHENYLEPHRINE HCL-NACL 20-0.9 MG/250ML-% IV SOLN
INTRAVENOUS | Status: AC
  Filled 2021-12-09: qty 250

## 2021-12-09 MED ORDER — TRANEXAMIC ACID-NACL 1000-0.7 MG/100ML-% IV SOLN
INTRAVENOUS | Status: AC
  Filled 2021-12-09: qty 100

## 2021-12-09 MED ORDER — METOCLOPRAMIDE HCL 5 MG/ML IJ SOLN
INTRAMUSCULAR | Status: AC
  Filled 2021-12-09: qty 2

## 2021-12-09 MED ORDER — SENNOSIDES-DOCUSATE SODIUM 8.6-50 MG PO TABS
2.0000 | ORAL_TABLET | Freq: Every day | ORAL | Status: DC
  Administered 2021-12-10 – 2021-12-11 (×2): 2 via ORAL
  Filled 2021-12-09 (×2): qty 2

## 2021-12-09 MED ORDER — METHYLERGONOVINE MALEATE 0.2 MG/ML IJ SOLN
INTRAMUSCULAR | Status: DC | PRN
  Administered 2021-12-09: .2 mg via INTRAMUSCULAR

## 2021-12-09 MED ORDER — KETOROLAC TROMETHAMINE 30 MG/ML IJ SOLN
30.0000 mg | Freq: Four times a day (QID) | INTRAMUSCULAR | Status: AC
  Administered 2021-12-09 – 2021-12-10 (×2): 30 mg via INTRAVENOUS
  Filled 2021-12-09 (×2): qty 1

## 2021-12-09 MED ORDER — LACTATED RINGERS IV SOLN: INTRAVENOUS | Status: DC

## 2021-12-09 MED ORDER — SIMETHICONE 80 MG PO CHEW: 80.0000 mg | CHEWABLE_TABLET | ORAL | Status: DC | PRN

## 2021-12-09 MED ORDER — METOCLOPRAMIDE HCL 5 MG/ML IJ SOLN
INTRAMUSCULAR | Status: DC | PRN
  Administered 2021-12-09: 10 mg via INTRAVENOUS

## 2021-12-09 MED ORDER — ACETAMINOPHEN 500 MG PO TABS: 1000.0000 mg | ORAL_TABLET | Freq: Four times a day (QID) | ORAL | Status: DC

## 2021-12-09 MED ORDER — SCOPOLAMINE 1 MG/3DAYS TD PT72
1.0000 | MEDICATED_PATCH | Freq: Once | TRANSDERMAL | Status: DC
  Administered 2021-12-09: 1.5 mg via TRANSDERMAL

## 2021-12-09 MED ORDER — OXYTOCIN-SODIUM CHLORIDE 30-0.9 UT/500ML-% IV SOLN
INTRAVENOUS | Status: DC | PRN
  Administered 2021-12-09: 300 mL via INTRAVENOUS

## 2021-12-09 MED ORDER — PHENYLEPHRINE HCL (PRESSORS) 10 MG/ML IV SOLN
INTRAVENOUS | Status: DC | PRN
  Administered 2021-12-09: 80 ug via INTRAVENOUS
  Administered 2021-12-09 (×2): 40 ug via INTRAVENOUS

## 2021-12-09 MED ORDER — BUPIVACAINE IN DEXTROSE 0.75-8.25 % IT SOLN
INTRATHECAL | Status: DC | PRN
  Administered 2021-12-09: 1.6 mL via INTRATHECAL

## 2021-12-09 MED ORDER — DIBUCAINE (PERIANAL) 1 % EX OINT: 1.0000 "application " | TOPICAL_OINTMENT | CUTANEOUS | Status: DC | PRN

## 2021-12-09 MED ORDER — FENTANYL CITRATE (PF) 100 MCG/2ML IJ SOLN: 25.0000 ug | INTRAMUSCULAR | Status: DC | PRN

## 2021-12-09 MED ORDER — SOD CITRATE-CITRIC ACID 500-334 MG/5ML PO SOLN
30.0000 mL | ORAL | Status: AC
  Administered 2021-12-09: 30 mL via ORAL

## 2021-12-09 MED ORDER — ONDANSETRON HCL 4 MG/2ML IJ SOLN
INTRAMUSCULAR | Status: DC | PRN
  Administered 2021-12-09: 4 mg via INTRAVENOUS

## 2021-12-09 MED ORDER — GABAPENTIN 300 MG PO CAPS
ORAL_CAPSULE | ORAL | Status: AC
  Filled 2021-12-09: qty 1

## 2021-12-09 MED ORDER — NALOXONE HCL 4 MG/10ML IJ SOLN
1.0000 ug/kg/h | INTRAVENOUS | Status: DC | PRN
  Filled 2021-12-09: qty 5

## 2021-12-09 MED ORDER — SERTRALINE HCL 100 MG PO TABS
100.0000 mg | ORAL_TABLET | Freq: Every day | ORAL | Status: DC
  Administered 2021-12-09 – 2021-12-11 (×2): 100 mg via ORAL
  Filled 2021-12-09 (×2): qty 1

## 2021-12-09 MED ORDER — PHENYLEPHRINE 40 MCG/ML (10ML) SYRINGE FOR IV PUSH (FOR BLOOD PRESSURE SUPPORT)
PREFILLED_SYRINGE | INTRAVENOUS | Status: AC
  Filled 2021-12-09: qty 10

## 2021-12-09 MED ORDER — CEFAZOLIN SODIUM-DEXTROSE 2-3 GM-%(50ML) IV SOLR
INTRAVENOUS | Status: DC | PRN
  Administered 2021-12-09: 2 g via INTRAVENOUS

## 2021-12-09 MED ORDER — DEXAMETHASONE SODIUM PHOSPHATE 4 MG/ML IJ SOLN
INTRAMUSCULAR | Status: DC | PRN
Start: 2021-12-09 — End: 2021-12-09
  Administered 2021-12-09: 8 mg via INTRAVENOUS

## 2021-12-09 MED ORDER — CEFAZOLIN SODIUM-DEXTROSE 2-4 GM/100ML-% IV SOLN: 2.0000 g | INTRAVENOUS | Status: DC

## 2021-12-09 MED ORDER — OXYTOCIN-SODIUM CHLORIDE 30-0.9 UT/500ML-% IV SOLN
INTRAVENOUS | Status: AC
  Filled 2021-12-09: qty 500

## 2021-12-09 MED ORDER — DIPHENHYDRAMINE HCL 50 MG/ML IJ SOLN: 12.5000 mg | INTRAMUSCULAR | Status: DC | PRN

## 2021-12-09 MED ORDER — KETOROLAC TROMETHAMINE 30 MG/ML IJ SOLN
INTRAMUSCULAR | Status: AC
  Filled 2021-12-09: qty 1

## 2021-12-09 MED ORDER — SIMETHICONE 80 MG PO CHEW
80.0000 mg | CHEWABLE_TABLET | Freq: Three times a day (TID) | ORAL | Status: DC
  Administered 2021-12-10 – 2021-12-11 (×4): 80 mg via ORAL
  Filled 2021-12-09 (×4): qty 1

## 2021-12-09 MED ORDER — POVIDONE-IODINE 10 % EX SWAB
2.0000 "application " | Freq: Once | CUTANEOUS | Status: AC
  Administered 2021-12-09: 2 via TOPICAL

## 2021-12-09 MED ORDER — TRANEXAMIC ACID-NACL 1000-0.7 MG/100ML-% IV SOLN: 1000.0000 mg | INTRAVENOUS | Status: AC

## 2021-12-09 MED ORDER — IBUPROFEN 600 MG PO TABS
600.0000 mg | ORAL_TABLET | Freq: Four times a day (QID) | ORAL | Status: DC
  Administered 2021-12-10 – 2021-12-11 (×4): 600 mg via ORAL
  Filled 2021-12-09 (×4): qty 1

## 2021-12-09 MED ORDER — MENTHOL 3 MG MT LOZG
1.0000 | LOZENGE | OROMUCOSAL | Status: DC | PRN
Start: 2021-12-09 — End: 2021-12-11

## 2021-12-09 MED ORDER — OXYCODONE HCL 5 MG PO TABS
5.0000 mg | ORAL_TABLET | ORAL | Status: DC | PRN
  Administered 2021-12-10: 10 mg via ORAL
  Administered 2021-12-10 (×3): 5 mg via ORAL
  Administered 2021-12-11: 10 mg via ORAL
  Filled 2021-12-09 (×3): qty 1
  Filled 2021-12-09 (×2): qty 2

## 2021-12-09 MED ORDER — CELECOXIB 200 MG PO CAPS
400.0000 mg | ORAL_CAPSULE | ORAL | Status: AC
  Administered 2021-12-09: 400 mg via ORAL

## 2021-12-09 MED ORDER — SOD CITRATE-CITRIC ACID 500-334 MG/5ML PO SOLN
ORAL | Status: AC
  Filled 2021-12-09: qty 30

## 2021-12-09 MED ORDER — ACETAMINOPHEN 10 MG/ML IV SOLN: 1000.0000 mg | Freq: Once | INTRAVENOUS | Status: DC | PRN

## 2021-12-09 MED ORDER — ACETAMINOPHEN 500 MG PO TABS
1000.0000 mg | ORAL_TABLET | Freq: Four times a day (QID) | ORAL | Status: DC
  Administered 2021-12-09 – 2021-12-11 (×7): 1000 mg via ORAL
  Filled 2021-12-09 (×7): qty 2

## 2021-12-09 MED ORDER — WITCH HAZEL-GLYCERIN EX PADS: 1.0000 "application " | MEDICATED_PAD | CUTANEOUS | Status: DC | PRN

## 2021-12-09 MED ORDER — CEFAZOLIN SODIUM-DEXTROSE 2-4 GM/100ML-% IV SOLN
INTRAVENOUS | Status: AC
  Filled 2021-12-09: qty 100

## 2021-12-09 MED ORDER — CELECOXIB 200 MG PO CAPS
ORAL_CAPSULE | ORAL | Status: AC
  Filled 2021-12-09: qty 2

## 2021-12-09 MED ORDER — FENTANYL CITRATE (PF) 100 MCG/2ML IJ SOLN
INTRAMUSCULAR | Status: AC
  Filled 2021-12-09: qty 2

## 2021-12-09 MED ORDER — TRANEXAMIC ACID-NACL 1000-0.7 MG/100ML-% IV SOLN
INTRAVENOUS | Status: DC | PRN
  Administered 2021-12-09: 1000 mg via INTRAVENOUS

## 2021-12-09 MED ORDER — COCONUT OIL OIL: 1.0000 "application " | TOPICAL_OIL | Status: DC | PRN

## 2021-12-09 MED ORDER — FENTANYL CITRATE (PF) 100 MCG/2ML IJ SOLN
INTRAMUSCULAR | Status: DC | PRN
  Administered 2021-12-09: 15 ug via INTRATHECAL

## 2021-12-09 MED ORDER — ACETAMINOPHEN 10 MG/ML IV SOLN
INTRAVENOUS | Status: DC | PRN
  Administered 2021-12-09: 1000 mg via INTRAVENOUS

## 2021-12-09 MED ORDER — ONDANSETRON HCL 4 MG/2ML IJ SOLN
INTRAMUSCULAR | Status: AC
  Filled 2021-12-09: qty 2

## 2021-12-09 MED ORDER — KETOROLAC TROMETHAMINE 30 MG/ML IJ SOLN: 30.0000 mg | Freq: Four times a day (QID) | INTRAMUSCULAR | Status: AC | PRN

## 2021-12-09 MED ORDER — DIPHENHYDRAMINE HCL 25 MG PO CAPS: 25.0000 mg | ORAL_CAPSULE | ORAL | Status: DC | PRN

## 2021-12-09 MED ORDER — OXYTOCIN-SODIUM CHLORIDE 30-0.9 UT/500ML-% IV SOLN: 2.5000 [IU]/h | INTRAVENOUS | Status: AC

## 2021-12-09 MED ORDER — KETOROLAC TROMETHAMINE 30 MG/ML IJ SOLN
30.0000 mg | Freq: Four times a day (QID) | INTRAMUSCULAR | Status: AC | PRN
  Administered 2021-12-09: 30 mg via INTRAVENOUS

## 2021-12-09 MED ORDER — SODIUM CHLORIDE 0.9% FLUSH: 3.0000 mL | INTRAVENOUS | Status: DC | PRN

## 2021-12-09 MED ORDER — OXYCODONE HCL 5 MG/5ML PO SOLN: 5.0000 mg | Freq: Once | ORAL | Status: DC | PRN

## 2021-12-09 MED ORDER — AMISULPRIDE (ANTIEMETIC) 5 MG/2ML IV SOLN
10.0000 mg | Freq: Once | INTRAVENOUS | Status: DC | PRN
  Filled 2021-12-09: qty 4

## 2021-12-09 MED ORDER — DEXAMETHASONE SODIUM PHOSPHATE 4 MG/ML IJ SOLN
INTRAMUSCULAR | Status: AC
  Filled 2021-12-09: qty 2

## 2021-12-09 MED ORDER — LACTATED RINGERS IV SOLN
INTRAVENOUS | Status: DC | PRN
Start: 2021-12-09 — End: 2021-12-09

## 2021-12-09 MED ORDER — SOD CITRATE-CITRIC ACID 500-334 MG/5ML PO SOLN: 30.0000 mL | Freq: Once | ORAL | Status: DC

## 2021-12-09 MED ORDER — PRENATAL MULTIVITAMIN CH
1.0000 | ORAL_TABLET | Freq: Every day | ORAL | Status: DC
  Administered 2021-12-10 – 2021-12-11 (×2): 1 via ORAL
  Filled 2021-12-09 (×2): qty 1

## 2021-12-09 MED ORDER — OXYCODONE HCL 5 MG PO TABS: 5.0000 mg | ORAL_TABLET | Freq: Once | ORAL | Status: DC | PRN

## 2021-12-09 MED ORDER — DIPHENHYDRAMINE HCL 25 MG PO CAPS: 25.0000 mg | ORAL_CAPSULE | Freq: Four times a day (QID) | ORAL | Status: DC | PRN

## 2021-12-09 MED ORDER — SCOPOLAMINE 1 MG/3DAYS TD PT72
MEDICATED_PATCH | TRANSDERMAL | Status: AC
  Filled 2021-12-09: qty 1

## 2021-12-09 MED ORDER — SODIUM CHLORIDE 0.9 % IR SOLN
Status: DC | PRN
  Administered 2021-12-09: 1000 mL

## 2021-12-09 MED ORDER — NALOXONE HCL 0.4 MG/ML IJ SOLN: 0.4000 mg | INTRAMUSCULAR | Status: DC | PRN

## 2021-12-09 MED ORDER — PHENYLEPHRINE HCL-NACL 20-0.9 MG/250ML-% IV SOLN
INTRAVENOUS | Status: DC | PRN
  Administered 2021-12-09: 60 ug/min via INTRAVENOUS

## 2021-12-09 MED ORDER — MORPHINE SULFATE (PF) 0.5 MG/ML IJ SOLN
INTRAMUSCULAR | Status: AC
  Filled 2021-12-09: qty 10

## 2021-12-09 MED ORDER — MORPHINE SULFATE (PF) 0.5 MG/ML IJ SOLN
INTRAMUSCULAR | Status: DC | PRN
  Administered 2021-12-09: 150 ug via INTRATHECAL

## 2021-12-09 MED ORDER — MEPERIDINE HCL 25 MG/ML IJ SOLN: 6.2500 mg | INTRAMUSCULAR | Status: DC | PRN

## 2021-12-09 SURGICAL SUPPLY — 34 items
BENZOIN TINCTURE PRP APPL 2/3 (GAUZE/BANDAGES/DRESSINGS) ×1 IMPLANT
CHLORAPREP W/TINT 26ML (MISCELLANEOUS) ×2 IMPLANT
CLAMP CORD UMBIL (MISCELLANEOUS) IMPLANT
CLOTH BEACON ORANGE TIMEOUT ST (SAFETY) ×2 IMPLANT
DRAIN JACKSON PRT FLT 7MM (DRAIN) IMPLANT
DRSG OPSITE POSTOP 4X10 (GAUZE/BANDAGES/DRESSINGS) ×2 IMPLANT
ELECT REM PT RETURN 9FT ADLT (ELECTROSURGICAL) ×2
ELECTRODE REM PT RTRN 9FT ADLT (ELECTROSURGICAL) ×1 IMPLANT
EVACUATOR SILICONE 100CC (DRAIN) IMPLANT
EXTRACTOR VACUUM M CUP 4 TUBE (SUCTIONS) IMPLANT
GAUZE SPONGE 4X4 12PLY STRL LF (GAUZE/BANDAGES/DRESSINGS) ×2 IMPLANT
GLOVE BIO SURGEON STRL SZ7 (GLOVE) ×2 IMPLANT
GLOVE BIOGEL PI IND STRL 7.0 (GLOVE) ×2 IMPLANT
GLOVE BIOGEL PI INDICATOR 7.0 (GLOVE) ×2
GOWN STRL REUS W/TWL LRG LVL3 (GOWN DISPOSABLE) ×4 IMPLANT
KIT ABG SYR 3ML LUER SLIP (SYRINGE) IMPLANT
NDL HYPO 25X5/8 SAFETYGLIDE (NEEDLE) ×1 IMPLANT
NEEDLE HYPO 25X5/8 SAFETYGLIDE (NEEDLE) ×2 IMPLANT
NS IRRIG 1000ML POUR BTL (IV SOLUTION) ×2 IMPLANT
PACK C SECTION WH (CUSTOM PROCEDURE TRAY) ×2 IMPLANT
PAD ABD 7.5X8 STRL (GAUZE/BANDAGES/DRESSINGS) ×1 IMPLANT
PAD OB MATERNITY 4.3X12.25 (PERSONAL CARE ITEMS) ×2 IMPLANT
RTRCTR C-SECT PINK 25CM LRG (MISCELLANEOUS) ×2 IMPLANT
STRIP CLOSURE SKIN 1/2X4 (GAUZE/BANDAGES/DRESSINGS) ×1 IMPLANT
SUT PLAIN 2 0 (SUTURE) ×1
SUT PLAIN ABS 2-0 CT1 27XMFL (SUTURE) IMPLANT
SUT VIC AB 0 CTX 36 (SUTURE) ×5
SUT VIC AB 0 CTX36XBRD ANBCTRL (SUTURE) ×5 IMPLANT
SUT VIC AB 4-0 KS 27 (SUTURE) ×2 IMPLANT
SYR KIT LINE DRAW 1CC W/FILTR (LINER) ×2 IMPLANT
TOWEL OR 17X24 6PK STRL BLUE (TOWEL DISPOSABLE) ×2 IMPLANT
TRAY FOLEY W/BAG SLVR 14FR LF (SET/KITS/TRAYS/PACK) ×2 IMPLANT
VACUUM CUP M-STYLE MYSTIC II (SUCTIONS) ×1 IMPLANT
WATER STERILE IRR 1000ML POUR (IV SOLUTION) ×2 IMPLANT

## 2021-12-09 NOTE — Anesthesia Procedure Notes (Signed)
Spinal  Patient location during procedure: OR Start time: 12/09/2021 2:13 PM End time: 12/09/2021 2:18 PM Reason for block: surgical anesthesia Staffing Performed: anesthesiologist  Anesthesiologist: Merlinda Frederick, MD Preanesthetic Checklist Completed: patient identified, IV checked, risks and benefits discussed, surgical consent, monitors and equipment checked, pre-op evaluation and timeout performed Spinal Block Patient position: sitting Prep: DuraPrep Patient monitoring: cardiac monitor, continuous pulse ox and blood pressure Approach: midline Location: L3-4 Injection technique: single-shot Needle Needle type: Pencan  Needle gauge: 24 G Needle length: 9 cm Assessment Events: CSF return Additional Notes Functioning IV was confirmed and monitors were applied. Sterile prep and drape, including hand hygiene and sterile gloves were used. The patient was positioned and the spine was prepped. The skin was anesthetized with lidocaine.  Free flow of clear CSF was obtained prior to injecting local anesthetic into the CSF.  The spinal needle aspirated freely following injection.  The needle was carefully withdrawn.  The patient tolerated the procedure well.

## 2021-12-09 NOTE — Op Note (Signed)
Julia Jimenez  PROCEDURE DATE: 12/09/2021  PREOPERATIVE DIAGNOSES: Intrauterine pregnancy at [redacted]w[redacted]d weeks gestation; fetal macrosomia  POSTOPERATIVE DIAGNOSES: The same  PROCEDURE: Primary Low Transverse Cesarean Section  SURGEON:  Dr. Silas Sacramento  ASSISTANT:  Dr. Vilma Meckel   ANESTHESIOLOGY TEAM: Anesthesiologist: Merlinda Frederick, MD CRNA: Flossie Dibble, CRNA  INDICATIONS: Julia Jimenez is a 36 y.o. [phone] at [redacted]w[redacted]d here for cesarean section secondary to the indications listed under preoperative diagnoses; please see preoperative note for further details.  The risks of cesarean section were discussed with the patient including but were not limited to: bleeding which may require transfusion or reoperation; infection which may require antibiotics; injury to bowel, bladder, ureters or other surrounding organs; injury to the fetus; need for additional procedures including hysterectomy in the event of a life-threatening hemorrhage; placental abnormalities wth subsequent pregnancies, incisional problems, thromboembolic phenomenon and other postoperative/anesthesia complications.   The patient concurred with the proposed plan, giving informed written consent for the procedure.    FINDINGS:  Viable female infant in cephalic presentation.  Apgars 7 and 8.  Clear amniotic fluid.  Intact placenta, three vessel cord.  Normal uterus, fallopian tubes and ovaries bilaterally.  ANESTHESIA: Spinal  INTRAVENOUS FLUIDS: 1000 ml   ESTIMATED BLOOD LOSS: 618 ml URINE OUTPUT:  100 ml SPECIMENS: Placenta sent to L&D COMPLICATIONS: None immediate  PROCEDURE IN DETAIL:   The patient preoperatively received intravenous antibiotics and had sequential compression devices applied to her lower extremities.  She was then taken to the operating room where spinal anesthesia was administered and was found to be adequate. She was then placed in a dorsal supine position with a leftward tilt, and prepped  and draped in a sterile manner.  A foley catheter was placed into her bladder and attached to constant gravity.    After an adequate timeout was performed, a Pfannenstiel skin incision was made with scalpel and carried through to the underlying layer of fascia. The fascia was incised in the midline, and this incision was extended bilaterally using the Mayo scissors.  Kocher clamps were applied to the superior aspect of the fascial incision and the underlying rectus muscles were dissected off bluntly and sharply.  A similar process was carried out on the inferior aspect of the fascial incision. The rectus muscles were separated in the midline and the peritoneum was entered bluntly. The Alexis self-retaining retractor was introduced into the abdominal cavity.    Attention was turned to the lower uterine segment where a low transverse hysterotomy was made with a scalpel and extended bilaterally bluntly.  The infant was successfully delivered, the cord was clamped and cut after one minute, and the infant was handed over to the awaiting neonatology team. Uterine massage was then administered, and the placenta delivered intact with a three-vessel cord. The uterus was then cleared of clots and debris.  The hysterotomy was closed with 0 Vicryl in a running locked fashion.  The pelvis was cleared of all clot and debris. Hemostasis was confirmed on all surfaces.  The retractor was removed.    The fascia was then closed using 0 Vicryl in a running fashion.  The subcutaneous layer was irrigated, re-approximated with a 2-0 plain gut running stitch, and the skin was closed with a 4-0 Vicryl subcuticular stitch. The patient tolerated the procedure well. Sponge, instrument and needle counts were correct x 3.  She was taken to the recovery room in stable condition.   Vilma Meckel, MD OB Fellow  Faculty Practice

## 2021-12-09 NOTE — Lactation Note (Addendum)
This note was copied from a baby's chart. Lactation Consultation Note  Patient Name: Julia Jimenez HWEXH'B Date: 12/09/2021 Reason for consult: Initial assessment;Mother's request;1st time breastfeeding;Primapara;Maternal endocrine disorder;Breastfeeding assistance Age:37 hours Mom on celebrex L2, flexiril L3, zoloft L2 and Robaxin L3.   Currently Mom only taking Zoloft as confirmed with review of medications with RN, Julia Jimenez.   Infant latched with signs of milk transfer for 15 min. Mom aware to offer more with breast compression and post pumping giving colostrum on a spoon.  Mom preference to get her personal pump from the car.   Plan  1. To feed based on cues 8-12x in 24 hr period. Mom to offer breasts and look for signs of milk transfer.  2. Mom to hand express or pump with personal pump once set up and give 5-7 ml per feeding on a spoon.  3. Personal pump q 3hrs for 15 min  4. I and O sheet reviewed.  All questions answered at the end of the visit.   Maternal Data Has patient been taught Hand Expression?: Yes Does the patient have breastfeeding experience prior to this delivery?: No  Feeding Mother's Current Feeding Choice: Breast Milk  LATCH Score Latch: Repeated attempts needed to sustain latch, nipple held in mouth throughout feeding, stimulation needed to elicit sucking reflex.  Audible Swallowing: Spontaneous and intermittent  Type of Nipple: Everted at rest and after stimulation  Comfort (Breast/Nipple): Soft / non-tender  Hold (Positioning): Assistance needed to correctly position infant at breast and maintain latch.  LATCH Score: 8   Lactation Tools Discussed/Used Tools: Pump;Flanges Flange Size: 21 Breast pump type: Double-Electric Breast Pump Pump Education: Setup, frequency, and cleaning;Milk Storage Reason for Pumping: increase stimulation Pumping frequency: every 3 hrs for 15 min  Interventions Interventions: Breast feeding basics  reviewed;Support pillows;Education;Assisted with latch;Position options;Skin to skin;Expressed Clinical research associate;Infant Driven Feeding Algorithm education;Hand express;DEBP;Breast compression;Adjust position  Discharge Pump: Personal  Consult Status Consult Status: Follow-up Date: 12/10/21 Follow-up type: In-patient    Julia Jimenez  Julia Jimenez 12/09/2021, 7:38 PM

## 2021-12-09 NOTE — Transfer of Care (Signed)
Immediate Anesthesia Transfer of Care Note  Patient: Julia Jimenez  Procedure(s) Performed: CESAREAN SECTION  Patient Location: PACU  Anesthesia Type:Spinal  Level of Consciousness: awake, alert  and oriented  Airway & Oxygen Therapy: Patient Spontanous Breathing  Post-op Assessment: Report given to RN and Post -op Vital signs reviewed and stable  Post vital signs: Reviewed and stable  Last Vitals:  Vitals Value Taken Time  BP    Temp    Pulse    Resp    SpO2      Last Pain:  Vitals:   12/09/21 1159  TempSrc: Oral         Complications: No notable events documented.

## 2021-12-09 NOTE — Discharge Summary (Signed)
Postpartum Discharge Summary     Patient Name: Julia Jimenez DOB: Jun 05, 1986 MRN: [id number]  Date of admission: 12/09/2021 Delivery date:12/09/2021  Delivering provider: Guss Bunde  Date of discharge: 12/11/2021  Admitting diagnosis: Delivery by elective cesarean section [O82] Intrauterine pregnancy: [redacted]w[redacted]d    Secondary diagnosis:  Principal Problem:   Delivery by elective cesarean section Active Problems:   History of recurrent miscarriages   Encounter for supervision of high risk multigravida of advanced maternal age, antepartum   Homozygous for 5G insertion allele   Large for gestational age fetus affecting management of mother, antepartum, third trimester, fetus 1   Gestational diabetes mellitus (GDM) affecting pregnancy, antepartum   Left shoulder pain  Additional problems: Acute blood loss anemia s/p IV Venofer 500 mg     Discharge diagnosis: Term Pregnancy Delivered                                              Post partum procedures: Trigger point injection - left trapezius Augmentation: N/A Complications: None  Hospital course: Sceduled C/S -  36y.o. yo [id number] admitted to the hospital 12/09/2021 for scheduled cesarean section with the following indication: Macrosomia. Delivery details are as follows:  Membrane Rupture Time/Date: 2:43 PM ,12/09/2021   Delivery Method:C-Section, Low Transverse  Details of operation can be found in separate operative note.  Patient had symptomatic acute blood loss anemia on POD#1 with hemoglobin of 8.8 and was given one dose of IV Venofer. She states this improved her symptoms of fatigue and lightheadedness.  She is ambulating, tolerating a regular diet, passing flatus, and urinating well.  On POD#2, she develop left shoulder pain with muscle spasm, which was treated with PRN pain medications and Flexeril. She also received a trigger point injection prior to discharge. She will continue to follow up for this issue  outpatient. She will have an incision check in 1 week and her postpartum visit in 4-6 weeks. Patient is discharged home in stable condition on  12/11/21        Newborn Data: Birth date:12/09/2021  Birth time:2:44 PM  Gender:Female  Living status:Living  Apgars:7 ,8  Weight:4440 g     Magnesium Sulfate received: No BMZ received: No Rhophylac: N/A MMR: N/A - Immune  T-DaP: Given prenatally Flu: Given prenatally  Transfusion: No  Physical exam  Vitals:   12/10/21 0500 12/10/21 1523 12/10/21 2048 12/11/21 0558  BP: 92/60 96/61 (!) 105/58 101/63  Pulse: 77 81 94 81  Resp: '18 18 17 18  ' Temp: 98.3 F (36.8 C) 98.3 F (36.8 C) 98.3 F (36.8 C) 98.5 F (36.9 C)  TempSrc: Oral Oral Oral Oral  SpO2: 94%     Weight:      Height:       General: alert, cooperative, and no distress Lochia: appropriate Uterine Fundus: firm and below umbilicus  Incision: healing well with no significant drainage, dressing is clean, dry, and intact DVT Evaluation: trace LE edema bilaterally at ankles, no calf tenderness to palpation   Labs: Lab Results  Component Value Date   WBC 8.6 12/11/2021   HGB 8.9 (L) 12/11/2021   HCT 27.5 (L) 12/11/2021   MCV 83.6 12/11/2021   PLT 155 12/11/2021   CMP Latest Ref Rng & Units 06/02/2021  Glucose 65 - 99 mg/dL 85  BUN 7 -  25 mg/dL 11  Creatinine 0.50 - 0.97 mg/dL 0.53  Sodium 135 - 146 mmol/L 136  Potassium 3.5 - 5.3 mmol/L 4.0  Chloride 98 - 110 mmol/L 101  CO2 20 - 32 mmol/L 26  Calcium 8.6 - 10.2 mg/dL 9.7  Total Protein 6.1 - 8.1 g/dL 6.9  Total Bilirubin 0.2 - 1.2 mg/dL 0.3  Alkaline Phos 39 - 117 U/L -  AST 10 - 30 U/L 11  ALT 6 - 29 U/L 20   Edinburgh Score: Edinburgh Postnatal Depression Scale Screening Tool 12/11/2021  I have been able to laugh and see the funny side of things. 0  I have looked forward with enjoyment to things. 0  I have blamed myself unnecessarily when things went wrong. 1  I have been anxious or worried for no good reason.  2  I have felt scared or panicky for no good reason. 1  Things have been getting on top of me. 1  I have been so unhappy that I have had difficulty sleeping. 0  I have felt sad or miserable. 0  I have been so unhappy that I have been crying. 1  The thought of harming myself has occurred to me. 0  Edinburgh Postnatal Depression Scale Total 6     After visit meds:  Allergies as of 12/11/2021   No Known Allergies      Medication List     STOP taking these medications    OneTouch Delica Plus [id number] Misc   OneTouch Verio Flex System w/Device Kit   OneTouch Verio test strip Generic drug: glucose blood   UltiCare Insulin Syringe 30G X 5/16" 1 ML Misc Generic drug: Insulin Syringe-Needle U-100       TAKE these medications    acetaminophen 500 MG tablet Commonly known as: TYLENOL Take 2 tablets (1,000 mg total) by mouth every 8 (eight) hours as needed (pain).   cyclobenzaprine 5 MG tablet Commonly known as: FLEXERIL TAKE 1 TABLET(5 MG) BY MOUTH AT BEDTIME AS NEEDED FOR MUSCLE SPASMS. STOP IMMEDIATELY IF PREGNANT   ibuprofen 600 MG tablet Commonly known as: ADVIL Take 1 tablet (600 mg total) by mouth every 6 (six) hours as needed.   methocarbamol 500 MG tablet Commonly known as: ROBAXIN Take 500 mg by mouth daily as needed for muscle spasms.   norethindrone 0.35 MG tablet Commonly known as: MICRONOR Take 1 tablet (0.35 mg total) by mouth daily.   oxyCODONE 5 MG immediate release tablet Commonly known as: Oxy IR/ROXICODONE Take 1 tablet (5 mg total) by mouth every 6 (six) hours as needed for severe pain or breakthrough pain.   prenatal multivitamin Tabs tablet Take 1 tablet by mouth daily at 12 noon.   sertraline 100 MG tablet Commonly known as: ZOLOFT TAKE 1 TABLET(100 MG) BY MOUTH DAILY               Discharge Care Instructions  (From admission, onward)           Start     Ordered   12/11/21 0000  Discharge wound care:       Comments:  Remove your dressing 5 days after your surgery date. You can then wash gently with soap and water in the shower and pat dry. You will have an incision check in about one week.   12/11/21 1226             Discharge home in stable condition Infant Feeding: Breast and bottle Infant Disposition: home with mother Discharge  instruction: per After Visit Summary and Postpartum booklet. Activity: Advance as tolerated. Pelvic rest for 6 weeks.  Diet: routine diet Future Appointments: Future Appointments  Date Time Provider [place]  12/15/2021 10:30 AM Guss Bunde, MD [facility] [place]  01/29/2022 10:10 AM Radene Gunning, MD [facility]   Follow up Visit:  [place] for [place] at [place]. Go on 12/15/2021.   Specialty: Obstetrics and Gynecology Why: Apt time is 10:30 Contact information: [place], [place] for [employer] at [place]. Go on 01/29/2022.   Specialty: Obstetrics and Gynecology Why: Apt time is 10:10 Contact information: [place], [place] [employer] [phone]               Message sent to [place] by Dr. Gwenlyn Perking on 12/09/21.  Please schedule this patient for a In person postpartum visit in 6 weeks with the following provider: Any provider. Additional Postpartum F/U: 2 hour GTT, Incision check 1 week, and BP check 1 week  High risk pregnancy complicated by: GDM, fetal macrosomia Delivery mode:  C-Section, Low Transverse  Anticipated Birth Control:  POPs  12/11/2021 Genia Del, MD

## 2021-12-09 NOTE — Anesthesia Postprocedure Evaluation (Signed)
Anesthesia Post Note  Patient: Julia Jimenez  Procedure(s) Performed: CESAREAN SECTION     Patient location during evaluation: Mother Baby Anesthesia Type: Spinal Level of consciousness: oriented and awake and alert Pain management: pain level controlled Vital Signs Assessment: post-procedure vital signs reviewed and stable Respiratory status: spontaneous breathing and respiratory function stable Cardiovascular status: blood pressure returned to baseline and stable Postop Assessment: no headache, no backache, no apparent nausea or vomiting and able to ambulate Anesthetic complications: no   No notable events documented.  Last Vitals:  Vitals:   12/09/21 1615 12/09/21 1630  BP: 105/69 103/72  Pulse: 79   Resp:    Temp: 37.1 C 36.7 C  SpO2: 98%     Last Pain:  Vitals:   12/09/21 1630  TempSrc:   PainSc: 0-No pain   Pain Goal:    LLE Motor Response: Non-purposeful movement (12/09/21 1615) LLE Sensation: Tingling (12/09/21 1615) RLE Motor Response: Non-purposeful movement (12/09/21 1615) RLE Sensation: Tingling (12/09/21 1615)     Epidural/Spinal Function Cutaneous sensation: Able to Discern Pressure (12/09/21 1615), Patient able to flex knees: No (12/09/21 1615), Patient able to lift hips off bed: No (12/09/21 1615), Back pain beyond tenderness at insertion site: No (12/09/21 1615), Progressively worsening motor and/or sensory loss: No (12/09/21 1615), Bowel and/or bladder incontinence post epidural: No (12/09/21 1615)  Mellody Dance

## 2021-12-09 NOTE — Anesthesia Preprocedure Evaluation (Addendum)
Anesthesia Evaluation  Patient identified by MRN, date of birth, ID band Patient awake    Reviewed: Allergy & Precautions, NPO status , Patient's Chart, lab work & pertinent test results  Airway Mallampati: II  TM Distance: >3 FB Neck ROM: Full    Dental no notable dental hx.    Pulmonary neg pulmonary ROS,    Pulmonary exam normal breath sounds clear to auscultation       Cardiovascular negative cardio ROS Normal cardiovascular exam Rhythm:Regular Rate:Normal     Neuro/Psych PSYCHIATRIC DISORDERS Anxiety Depression  Neuromuscular disease (lumbar radiculopathy/sciatica)    GI/Hepatic negative GI ROS, Neg liver ROS,   Endo/Other  diabetes, Gestational  Renal/GU negative Renal ROS  negative genitourinary   Musculoskeletal negative musculoskeletal ROS (+)   Abdominal   Peds negative pediatric ROS (+)  Hematology negative hematology ROS (+)   Anesthesia Other Findings   Reproductive/Obstetrics (+) Pregnancy                            Anesthesia Physical Anesthesia Plan  ASA: 2  Anesthesia Plan: Spinal   Post-op Pain Management: Toradol IV (intra-op)   Induction:   PONV Risk Score and Plan: 2 and Treatment may vary due to age or medical condition, Scopolamine patch - Pre-op, Ondansetron and Dexamethasone  Airway Management Planned: Natural Airway  Additional Equipment: None  Intra-op Plan:   Post-operative Plan:   Informed Consent: I have reviewed the patients History and Physical, chart, labs and discussed the procedure including the risks, benefits and alternatives for the proposed anesthesia with the patient or authorized representative who has indicated his/her understanding and acceptance.     Dental advisory given  Plan Discussed with: Anesthesiologist and CRNA  Anesthesia Plan Comments: (Spinal. GETA as backup)       Anesthesia Quick Evaluation

## 2021-12-09 NOTE — H&P (Signed)
OBSTETRIC ADMISSION HISTORY AND PHYSICAL  Daylen Alexius Ellington is a 36 y.o. female G3P0020 with IUP at 44w1dby LMP presenting for scheduled cesarean section. She reports +FMs, no LOF, no VB, no blurry vision, headaches, peripheral edema, or RUQ pain.  She plans on breast feeding. She requests POPs for birth control postpartum.  [facility]   Dating: By LMP --->  Estimated Date of Delivery: 12/15/21  Sono:   '@[redacted]w[redacted]d' , CWD, normal anatomy, cephalic presentation, anterior placental lie, 3826 g, >99% EFW  Prenatal History/Complications:  Gestational diabetes  LGA fetus > 99% EFW Homozygous for 5G insertion allele Hx recurrent miscarriages   Past Medical History: Past Medical History:  Diagnosis Date   Allergic rhinitis    Anemia    in high school   Anxiety    Elevated blood pressure reading without diagnosis of hypertension    in context of Adderall initiation   Gestational diabetes    History of chicken pox    Infertility, female    Ovarian cyst, right 2014    Past Surgical History: Past Surgical History:  Procedure Laterality Date   CHOLECYSTECTOMY, LAPAROSCOPIC     TONSILLECTOMY AND ADENOIDECTOMY     WISDOM TOOTH EXTRACTION      Obstetrical History: OB History     Gravida  3   Para  0   Term  0   Preterm  0   AB  2   Living  0      SAB  2   IAB  0   Ectopic  0   Multiple  0   Live Births              Social History Social History   Socioeconomic History   Marital status: Married    Spouse name: Not on file   Number of children: Not on file   Years of education: Not on file   Highest education level: Professional school degree (e.g., MD, DDS, DVM, JD)  Occupational History   Occupation: Dentist  Tobacco Use   Smoking status: Never   Smokeless tobacco: Never  Vaping Use   Vaping Use: Never used  Substance and Sexual Activity   Alcohol use: Not Currently    Alcohol/week: 2.0 standard drinks     Types: 2 Standard drinks or equivalent per week    Comment: occasionally   Drug use: No   Sexual activity: Yes    Partners: Male    Birth control/protection: None    Comment: 01/08/2021 last intercourse  Other Topics Concern   Not on file  Social History Narrative   Married    2 people living in household   Employed    Highest education level DDS   Occupation Dentist   7-8 hours of sleep a night    Social Determinants of Health   Financial Resource Strain: Not on file  Food Insecurity: Not on file  Transportation Needs: Not on file  Physical Activity: Not on file  Stress: Not on file  Social Connections: Not on file    Family History: Family History  Problem Relation Age of Onset   Hypertension Mother    Depression Mother    Anxiety disorder Mother    Diabetes Father    Hypertension Brother    Depression Brother    Diabetes Maternal Aunt    Stroke Maternal Uncle    Diabetes Maternal Uncle        x2   Diabetes Paternal Grandmother  Heart attack Paternal Grandmother    Cancer Maternal Grandfather        Dec CA of unknown origin   Hepatitis C Maternal Grandmother        Dec    Allergies: No Known Allergies  Medications Prior to Admission  Medication Sig Dispense Refill Last Dose   cyclobenzaprine (FLEXERIL) 5 MG tablet TAKE 1 TABLET(5 MG) BY MOUTH AT BEDTIME AS NEEDED FOR MUSCLE SPASMS. STOP IMMEDIATELY IF PREGNANT 30 tablet 2 12/08/2021   methocarbamol (ROBAXIN) 500 MG tablet Take 500 mg by mouth daily as needed for muscle spasms.   12/08/2021   Prenatal Vit-Fe Fumarate-FA (PRENATAL MULTIVITAMIN) TABS tablet Take 1 tablet by mouth daily at 12 noon.   12/08/2021   sertraline (ZOLOFT) 100 MG tablet TAKE 1 TABLET(100 MG) BY MOUTH DAILY 90 tablet 1 12/08/2021   Blood Glucose Monitoring Suppl (BLOOD GLUCOSE MONITOR SYSTEM) w/Device KIT Use up to 4 times daily as directed 1 kit 0    glucose blood test strip Use up to 4 times daily as directed 50 each 0    Insulin  Syringe-Needle U-100 30G X 5/16" 1 ML MISC Use as directed twice daily. 60 each 3    Lancets (ONETOUCH DELICA PLUS [id number]) MISC Use up to 4 times daily as directed 100 each 0      Review of Systems  All systems reviewed and negative except as stated in HPI  Blood pressure (!) 137/95, pulse (!) 103, temperature 98.6 F (37 C), temperature source Oral, resp. rate 18, height '5\' 2"'  (9.528 m), weight 86.9 kg, last menstrual period 03/10/2021, SpO2 100 %.  General appearance: alert, cooperative, and no distress Lungs: normal work of breathing on room air  Heart: normal rate, warm and well perfused  Abdomen: soft, non-tender, gravid Extremities: no LE edema or calf tenderness to palpation   Prenatal labs: ABO, Rh: --/--/A POS (01/30 1148) Antibody: NEG (01/30 1148) Rubella: 7.67 (06/27 1559) RPR: NON REACTIVE (01/30 1144)  HBsAg: NON-REACTIVE (06/27 1559)  HIV: Non Reactive (11/11 0830)  GBS:  Negative 2 hr Glucola normal; LGA infant and fasting CBGs elevated, consistent with GDM Genetic screening - LR NIPS, Horizon neg, AFP neg Anatomy US normal   Prenatal Transfer Tool  Maternal Diabetes: Yes:  Diabetes Type:  Diet controlled Genetic Screening: Normal Maternal Ultrasounds/Referrals: LGA fetus, otherwise normal  Fetal Ultrasounds or other Referrals:  None Maternal Substance Abuse:  No Significant Maternal Medications:  None Significant Maternal Lab Results: Group B Strep negative  Results for orders placed or performed during the hospital encounter of 12/09/21 (from the past 24 hour(s))  Resp Panel by RT-PCR (Flu A&B, Covid) Nasopharyngeal Swab   Collection Time: 12/09/21 11:43 AM   Specimen: Nasopharyngeal Swab; Nasopharyngeal(NP) swabs in vial transport medium  Result Value Ref Range   SARS Coronavirus 2 by RT PCR NEGATIVE NEGATIVE   Influenza A by PCR NEGATIVE NEGATIVE   Influenza B by PCR NEGATIVE NEGATIVE  Glucose, capillary   Collection Time: 12/09/21 11:49 AM   Result Value Ref Range   Glucose-Capillary 82 70 - 99 mg/dL    Patient Active Problem List   Diagnosis Date Noted   Large for gestational age fetus affecting management of mother, antepartum, third trimester, fetus 1 11/24/2021   Gestational diabetes mellitus (GDM) affecting pregnancy, antepartum 11/24/2021   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   Encounter for supervision of high risk multigravida of advanced maternal age, antepartum 04/29/2021   History of recurrent miscarriages 02/10/2021  Chronic low back pain without sciatica 11/29/2020   Lumbar radiculopathy 03/23/2020   Chronic maxillary sinusitis 11/10/2018   Depression 09/10/2018   Sleep difficulties 09/10/2018   Family history of diabetes mellitus 09/24/2017   Bursitis of shoulder, left 05/04/2017   Anxiety 09/22/2015   Non-allergic rhinitis 06/28/2014    Assessment/Plan:  Sharra Cayabyab is a 36 y.o. G3P0020 at 70w1dhere for scheduled primary cesarean section.   The risks of surgery were discussed with the patient including but were not limited to: bleeding which may require transfusion or reoperation; infection which may require antibiotics; injury to bowel, bladder, ureters or other surrounding organs; injury to the fetus; need for additional procedures including hysterectomy in the event of a life-threatening hemorrhage; formation of adhesions; placental abnormalities with subsequent pregnancies; incisional problems; thromboembolic phenomenon and other postoperative/anesthesia complications.  The patient concurred with the proposed plan, giving informed written consent for the procedure.   Patient has been NPO since last night and she will remain NPO for procedure. Anesthesia and OR aware. Preoperative prophylactic antibiotics and SCDs ordered on call to the OR.  To OR when ready.  #Pain: Per anesthesia  #ID:  GBS neg, preoperative antibiotics ordered  #MOF: Breast  #MOC: POPs #Circ:  Desires   #GDM:  Plan for fasting CBG postpartum.  CGenia Del MD  12/09/2021, 1:36 PM

## 2021-12-10 ENCOUNTER — Encounter (HOSPITAL_COMMUNITY): Payer: Self-pay | Admitting: Obstetrics & Gynecology

## 2021-12-10 LAB — BIRTH TISSUE RECOVERY COLLECTION (PLACENTA DONATION)

## 2021-12-10 LAB — CBC
HCT: 27 % — ABNORMAL LOW (ref 36.0–46.0)
Hemoglobin: 8.8 g/dL — ABNORMAL LOW (ref 12.0–15.0)
MCH: 26.7 pg (ref 26.0–34.0)
MCHC: 32.6 g/dL (ref 30.0–36.0)
MCV: 81.8 fL (ref 80.0–100.0)
Platelets: 149 10*3/uL — ABNORMAL LOW (ref 150–400)
RBC: 3.3 MIL/uL — ABNORMAL LOW (ref 3.87–5.11)
RDW: 13.8 % (ref 11.5–15.5)
WBC: 12.3 10*3/uL — ABNORMAL HIGH (ref 4.0–10.5)
nRBC: 0 % (ref 0.0–0.2)

## 2021-12-10 MED ORDER — SODIUM CHLORIDE 0.9 % IV SOLN
500.0000 mg | Freq: Once | INTRAVENOUS | Status: AC
  Administered 2021-12-10: 500 mg via INTRAVENOUS
  Filled 2021-12-10: qty 25

## 2021-12-10 NOTE — Social Work (Signed)
MOB was referred for history of depression and anxiety.   * Referral screened out by Clinical Social Worker because none of the following criteria appear to apply:  ~ History of anxiety/depression during this pregnancy, or of post-partum depression following prior delivery. ~ Diagnosis of anxiety and/or depression within last 3 years. OR * MOB's symptoms currently being treated with medication and/or therapy. Per chart review, MOB currently prescribed Zoloft.   Please contact the Clinical Social Worker if needs arise, by MOB request, or if MOB scores greater than 9/yes to question 10 on Edinburgh Postpartum Depression Screen.  Maciej Schweitzer, LCSWA Clinical Social Work [facility]  [phone] 

## 2021-12-10 NOTE — Progress Notes (Signed)
Post Partum Day 1 Subjective: No acute events overnight. Pain is controlled. Pt states she has been soaking pads and had a large clot over night, close to the size of a silver dollar. She is feeling light headed when she stands and is very fatigued. She is eating and drinking, still has a urinary catheter in and had not yet passed flatus. She is breast and bottle feeding which is going well. She has no other concerns at this time.  Objective: Blood pressure 92/60, pulse 77, temperature 98.3 F (36.8 C), temperature source Oral, resp. rate 18, height 5\' 2"  (1.575 m), weight 86.9 kg, last menstrual period 03/10/2021, SpO2 94 %, unknown if currently breastfeeding.  Physical Exam:  General: alert, fatigued, and no distress Uterine Fundus: firm Incision: pressure dressing intact DVT Evaluation: No significant calf/ankle edema. Wearing SCDs  Recent Labs    12/08/21 1144 12/10/21 0505  HGB 12.5 8.8*  HCT 37.7 27.0*    Assessment/Plan: Julia Jimenez is a 36 y.o. G3P1021 on PPD# 1 s/p pLTCS.  Blood loss anemia: Hgb is 8.8 this morning, down from 12.5. IV venofer 500 mg this morning BP a little low at 92/60 this morning. Will continue LR 125 mL/hr and CTM BP.  Feeding: pumping/breast and bottle Contraception: POPs Circumcision: would like circ  Dispo: home in 1-2 days   LOS: 1 day   36, DO  12/10/2021, 8:32 AM

## 2021-12-10 NOTE — Lactation Note (Signed)
This note was copied from a baby's chart. Lactation Consultation Note  Patient Name: Julia Jimenez [id number] Date: 12/10/2021 Reason for consult: Follow-up assessment;Term Age:37 hours   P1 mother whose infant is now 37 hours old.  This is a term baby at 37+1 weeks.  Mother's original feeding preference was breast, however, it is not currently breast/formula.    Baby "Julia Jimenez" was swaddled and asleep in mother's lap when I arrived.  Upon chart review I noticed mother has been providing donor breast milk for the past 21 hours, however, it did not appear that she was latching.  Reviewed mother's current feeding plan and she verified that he has not been latching (per her choice) and that she wanted to provide supplementation because he was not satisfied with colostrum.  She has been giving donor breast milk but has now switched to formula since she cannot take the donor breast milk home with her.  Suggested mother start latching "[name]" with every feeding and supplement as needed.  Explained the reason for allowing "[name]" to latch and mother receptive.  Discussed "supply and demand" and milk coming to volume.  She will begin doing this with the next feeding and will post pump using her own personal pump.  This is mother's choice to use her pump.  She is aware that she can call her RN/LC for latch assistance as needed.  Discussed cluster feeding.  Father present.   Maternal Data Has patient been taught Hand Expression?: Yes Does the patient have breastfeeding experience prior to this delivery?: Yes  Feeding Mother's Current Feeding Choice: Breast Milk and Formula Nipple Type: Slow - flow  LATCH Score                    Lactation Tools Discussed/Used Tools: Pump (Personal) Breast pump type: Double-Electric Breast Pump Reason for Pumping: Mother's preference to use her own personal pump in the hospital Pumping frequency: After breast feeding or  prn  Interventions Interventions: Breast feeding basics reviewed;Education  Discharge Pump: Personal WIC Program: No  Consult Status Consult Status: Follow-up Date: 12/11/21 Follow-up type: In-patient    Shaunda Tipping R Arloa Prak 12/10/2021, 4:07 PM

## 2021-12-11 ENCOUNTER — Telehealth: Payer: Self-pay | Admitting: *Deleted

## 2021-12-11 DIAGNOSIS — M25512 Pain in left shoulder: Secondary | ICD-10-CM | POA: Diagnosis not present

## 2021-12-11 LAB — CBC
HCT: 27.5 % — ABNORMAL LOW (ref 36.0–46.0)
Hemoglobin: 8.9 g/dL — ABNORMAL LOW (ref 12.0–15.0)
MCH: 27.1 pg (ref 26.0–34.0)
MCHC: 32.4 g/dL (ref 30.0–36.0)
MCV: 83.6 fL (ref 80.0–100.0)
Platelets: 155 10*3/uL (ref 150–400)
RBC: 3.29 MIL/uL — ABNORMAL LOW (ref 3.87–5.11)
RDW: 14.2 % (ref 11.5–15.5)
WBC: 8.6 10*3/uL (ref 4.0–10.5)
nRBC: 0 % (ref 0.0–0.2)

## 2021-12-11 MED ORDER — TRIAMCINOLONE ACETONIDE 40 MG/ML IJ SUSP
Freq: Once | INTRAMUSCULAR | Status: AC
  Filled 2021-12-11 (×2): qty 1

## 2021-12-11 MED ORDER — TRIAMCINOLONE ACETONIDE 40 MG/ML IJ SUSP
Freq: Once | INTRAMUSCULAR | Status: DC
  Filled 2021-12-11: qty 1

## 2021-12-11 MED ORDER — NORETHINDRONE 0.35 MG PO TABS
1.0000 | ORAL_TABLET | Freq: Every day | ORAL | 11 refills | Status: DC
Start: 2021-12-11 — End: 2022-01-23

## 2021-12-11 MED ORDER — ACETAMINOPHEN 500 MG PO TABS: 1000.0000 mg | ORAL_TABLET | Freq: Three times a day (TID) | ORAL | 0 refills | Status: DC | PRN

## 2021-12-11 MED ORDER — OXYCODONE HCL 5 MG PO TABS
5.0000 mg | ORAL_TABLET | Freq: Four times a day (QID) | ORAL | 0 refills | Status: DC | PRN
Start: 2021-12-11 — End: 2022-02-26

## 2021-12-11 MED ORDER — IBUPROFEN 600 MG PO TABS: 600.0000 mg | ORAL_TABLET | Freq: Four times a day (QID) | ORAL | 0 refills | Status: DC | PRN

## 2021-12-11 MED ORDER — CYCLOBENZAPRINE HCL 5 MG PO TABS
10.0000 mg | ORAL_TABLET | Freq: Once | ORAL | Status: AC
  Administered 2021-12-11: 10 mg via ORAL
  Filled 2021-12-11: qty 2

## 2021-12-11 NOTE — Progress Notes (Signed)
Post Operative Day 2  Subjective: Doing okay this morning. Having significant left shoulder pain that is bothering her. Reports hx of bursitis and states it feels similar but much worse right now. Has taken her PRN pain medications and is planning to do heat and walk around some to help with this. Moving some gas, does not feel particularly bloated. No BM yet. Has taken Flexeril in the past which has also helped and wondering if she can try this. She reports her abdominal pain/incision pain is improved since yesterday. She is eating, drinking, voiding, and ambulating without issue. She is breast and bottle feeding which is going well. No other concerns at this time.  Objective: Blood pressure 101/63, pulse 81, temperature 98.5 F (36.9 C), temperature source Oral, resp. rate 18, height 5\' 2"  (1.575 m), weight 86.9 kg, last menstrual period 03/10/2021, SpO2 94 %, unknown if currently breastfeeding.  Physical Exam:  General: alert, cooperative, and no distress Lochia: appropriate Uterine Fundus: firm and below umbilicus  Incision: healing well, no significant drainage, no significant erythema, dressing is clean/dry/intact  DVT Evaluation: no LE edema or calf tenderness to palpation  Recent Labs    12/10/21 0505 12/11/21 0807  HGB 8.8* 8.9*  HCT 27.0* 27.5*    Assessment/Plan: Shaquilla Loany Neuroth is a 36 y.o. G3P1021 on POD# 2 s/p pLTCS.  Progressing well post-operatively. Meeting postpartum milestones. VSS.   #Acute blood loss anemia: - S/p Venofer - Remains asymptomatic  - Plan to continue PO iron upon discharge   #Left shoulder pain: - Acute on chronic - Potentially exacerbated by positioning during surgery - Abdomen is not distended, passing flatus  - Will try dose of Flexeril, heat, NSAIDs, and ambulation - Plan to reassess this afternoon  Feeding: Breast/bottle Contraception: POPs Circumcision: Plan to perform today  Dispo: Plan for discharge later today pending  improvement in shoulder pain.    LOS: 2 days   31, MD  12/11/2021, 9:56 AM

## 2021-12-11 NOTE — Progress Notes (Incomplete)
2 Days Post-Op Procedure(s) (LRB): CESAREAN SECTION (N/A)  Subjective: Patient reports {sub:3041132}.    Objective: I have reviewed patient's {reviewed:3041135}.  {Physical NR:[id number]  Assessment: s/p Procedure(s): CESAREAN SECTION (N/A): {assessment details:3041134}  Plan: [id number]  LOS: 2 days    Silas Sacramento 12/11/2021, 12:30 PM

## 2021-12-11 NOTE — Telephone Encounter (Signed)
Left patient a message that she will need a 2 hr GTT with scheduled appointment on 01/29/2022, appointment has been moved up to 8:30 AM with a 8:15 AM arrival time.

## 2021-12-11 NOTE — Lactation Note (Signed)
This note was copied from a baby's chart. Lactation Consultation Note  Patient Name: Julia Jimenez IZTIW'P Date: 12/11/2021 Reason for consult: Follow-up assessment;1st time breastfeeding;Primapara;Term;Infant weight loss;Other (Comment) (7 % weight loss/ per mom baby having a circ at present and has not pumped due to left shoulder pain ( presently has a heating pad on it). per mom has been pumping w/ her own DEBP.) per mom plans to breast feed when the milk comes in and pump and bottle feed due to having to go back to work at 8 weeks.  Age:37 hours  LC reviewed supply and demand / importance of consistent pumping around the clock ( 8-10 times a day and prn ) to establish and protect the milk supply.  LC recommended when mom desires to latch the baby when the milk comes in she may have to feed the baby an appetizer from the bottle and then latch.  If the baby is getting just a bottle for a feeding by 48 hours the amount has to be at least 30 ml .  LC reviewed BF D/C teaching. See below and mom aware of her LC / BF resources after D/C.    Maternal Data    Feeding Mother's Current Feeding Choice: Breast Milk and Formula Nipple Type: Slow - flow  LATCH Score                    Lactation Tools Discussed/Used    Interventions    Discharge Discharge Education: Engorgement and breast care;Outpatient recommendation;Other (comment) (per mom aware her  [facility] group has and Lactation consultant and may connect with her. LC also mentioned [place] O/P and mom has the number if needed.) Pump: Personal;DEBP  Consult Status Consult Status: Complete Date: 12/11/21    Kathrin Greathouse 12/11/2021, 11:09 AM

## 2021-12-13 ENCOUNTER — Other Ambulatory Visit: Payer: Self-pay | Admitting: Internal Medicine

## 2021-12-15 ENCOUNTER — Other Ambulatory Visit: Payer: Self-pay

## 2021-12-15 ENCOUNTER — Telehealth (INDEPENDENT_AMBULATORY_CARE_PROVIDER_SITE_OTHER): Payer: BC Managed Care – PPO | Admitting: Obstetrics & Gynecology

## 2021-12-15 ENCOUNTER — Encounter: Payer: Self-pay | Admitting: Obstetrics & Gynecology

## 2021-12-15 DIAGNOSIS — Z98891 History of uterine scar from previous surgery: Secondary | ICD-10-CM

## 2021-12-15 DIAGNOSIS — Z4889 Encounter for other specified surgical aftercare: Secondary | ICD-10-CM

## 2021-12-15 NOTE — Progress Notes (Signed)
Patient ID: Julia Jimenez, female   DOB: October 05, 1986, 36 y.o.   MRN: [id number]    Incision Check VIRTUAL VISIT ENCOUNTER NOTE  [facility]   Patient location: Home  I connected with Julia Jimenez on 12/14/2021 at 4:20 PM EST by telephone encounter/and verified that I am speaking with the correct person using two identifiers.   I discussed the limitations, risks, security and privacy concerns of performing an evaluation and management service virtually and the availability of in person appointments. I also discussed with the patient that there may be a patient responsible charge related to this service. The patient expressed understanding and agreed to proceed.   History:  Julia Jimenez is a 36 y.o. [phone] female being evaluated today for postpartum check and an incision check. Pt is 6 days post partum and healing well.  Sent pictures of incision.  No evidence of infection.    Baby is not sleeping well and she is tearful.  She does have adequate support with her husband, mother-in-law, mother, and a night nurse coming to see if 1 good night sleep will help her.  Shoulder pain is manageable.  She appreciates not being to come to the office today.     Past Medical History:  Diagnosis Date   Allergic rhinitis    Anemia    in high school   Anxiety    Elevated blood pressure reading without diagnosis of hypertension    in context of Adderall initiation   Gestational diabetes    History of chicken pox    Infertility, female    Ovarian cyst, right 2014   Past Surgical History:  Procedure Laterality Date   CESAREAN SECTION N/A 12/09/2021   Procedure: CESAREAN SECTION;  Surgeon: Lesly DukesLeggett, Harman Ferrin H, MD;  Location: [facility] LD ORS;  Service: Obstetrics;  Laterality: N/A;   CHOLECYSTECTOMY, LAPAROSCOPIC     TONSILLECTOMY AND ADENOIDECTOMY     WISDOM TOOTH EXTRACTION     The following portions of the patient's history were  reviewed and updated as appropriate: allergies, current medications, past family history, past medical history, past social history, past surgical history and problem list.   Review of Systems:  Pertinent items noted in HPI and remainder of comprehensive ROS otherwise negative.  Physical Exam:   General:  Alert, oriented and cooperative. Patient appears to be in no acute distress.  Mental Status: Normal mood and affect. Normal behavior. Normal judgment and thought content.   Respiratory: Normal respiratory effort, no problems with respiration noted  Rest of physical exam deferred due to type of encounter  Labs and Imaging Results for orders placed or performed during the hospital encounter of 12/09/21 (from the past 336 hour(s))  Resp Panel by RT-PCR (Flu A&B, Covid) Nasopharyngeal Swab   Collection Time: 12/09/21 11:43 AM   Specimen: Nasopharyngeal Swab; Nasopharyngeal(NP) swabs in vial transport medium  Result Value Ref Range   SARS Coronavirus 2 by RT PCR NEGATIVE NEGATIVE   Influenza A by PCR NEGATIVE NEGATIVE   Influenza B by PCR NEGATIVE NEGATIVE  Glucose, capillary   Collection Time: 12/09/21 11:49 AM  Result Value Ref Range   Glucose-Capillary 82 70 - 99 mg/dL  Glucose, capillary   Collection Time: 12/09/21  4:35 PM  Result Value Ref Range   Glucose-Capillary 90 70 - 99 mg/dL  Collect bld for placenta donatation   Collection Time: 12/10/21  5:05 AM  Result Value Ref Range   Placenta donation bld collect  Collected by Laboratory   CBC   Collection Time: 12/10/21  5:05 AM  Result Value Ref Range   WBC 12.3 (H) 4.0 - 10.5 K/uL   RBC 3.30 (L) 3.87 - 5.11 MIL/uL   Hemoglobin 8.8 (L) 12.0 - 15.0 g/dL   HCT 79.1 (L) 50.5 - 69.7 %   MCV 81.8 80.0 - 100.0 fL   MCH 26.7 26.0 - 34.0 pg   MCHC 32.6 30.0 - 36.0 g/dL   RDW 94.8 01.6 - 55.3 %   Platelets 149 (L) 150 - 400 K/uL   nRBC 0.0 0.0 - 0.2 %  CBC   Collection Time: 12/11/21  8:07 AM  Result Value Ref Range   WBC 8.6  4.0 - 10.5 K/uL   RBC 3.29 (L) 3.87 - 5.11 MIL/uL   Hemoglobin 8.9 (L) 12.0 - 15.0 g/dL   HCT 74.8 (L) 27.0 - 78.6 %   MCV 83.6 80.0 - 100.0 fL   MCH 27.1 26.0 - 34.0 pg   MCHC 32.4 30.0 - 36.0 g/dL   RDW 75.4 49.2 - 01.0 %   Platelets 155 150 - 400 K/uL   nRBC 0.0 0.0 - 0.2 %  Results for orders placed or performed during the hospital encounter of 12/08/21 (from the past 336 hour(s))  CBC   Collection Time: 12/08/21 11:44 AM  Result Value Ref Range   WBC 9.4 4.0 - 10.5 K/uL   RBC 4.59 3.87 - 5.11 MIL/uL   Hemoglobin 12.5 12.0 - 15.0 g/dL   HCT 07.1 21.9 - 75.8 %   MCV 82.1 80.0 - 100.0 fL   MCH 27.2 26.0 - 34.0 pg   MCHC 33.2 30.0 - 36.0 g/dL   RDW 83.2 54.9 - 82.6 %   Platelets 160 150 - 400 K/uL   nRBC 0.0 0.0 - 0.2 %  RPR   Collection Time: 12/08/21 11:44 AM  Result Value Ref Range   RPR Ser Ql NON REACTIVE NON REACTIVE  Type and screen [facility]   Collection Time: 12/08/21 11:48 AM  Result Value Ref Range   ABO/RH(D) A POS    Antibody Screen NEG    Sample Expiration      12/11/2021,2359 Performed at [facility], [address] [place], [place] [address]    [place] MFM FETAL BPP WO NON STRESS  Result Date: 11/18/2021 ----------------------------------------------------------------------  OBSTETRICS REPORT                       (Signed Final 11/18/2021 08:29 am) ---------------------------------------------------------------------- Patient Info  ID #:       [id number]                          D.O.B.:  1986-09-10 (35 yrs)  Name:       Julia Jimenez             Visit Date: 11/17/2021 05:32 pm ---------------------------------------------------------------------- Performed By  Attending:        Lin Landsman      Ref. Address:     [address]                    [place]  [address]                                                             [place], [place]                                                              [phone]  Performed By:     Percell BostonHeather Waken          [facility]  Referred By:      Tereso NewcomerUGONNA A                    ANYANWU MD ---------------------------------------------------------------------- Orders  #  Description                           Code        Ordered By  1  US MFM FETAL BPP WO NON               [id number]    JACOB STINSON     STRESS ----------------------------------------------------------------------  #  Order #                     Accession #                Episode #  1  [id number]                   [id number]                 [id number] ---------------------------------------------------------------------- Indications  Non-reactive NST                               O28.9  [redacted] weeks gestation of pregnancy                Z3A.6236  Advanced maternal age multigravida 4635+,        58O09.523  third trimester  Medical complication of pregnancy -            O26.90  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 Cord Insertion:      Visualized, central  Amniotic Fluid  AFI FV:  Subjectively increased  AFI Sum(cm)     %Tile       Largest Pocket(cm)  23.3            89          8.4  RUQ(cm)       RLQ(cm)       LUQ(cm)        LLQ(cm)  6.9           8.4           3.2            4.8 ---------------------------------------------------------------------- Biophysical Evaluation  Amniotic F.V:   Within normal limits       F. Tone:        Observed  F. Movement:    Observed                   Score:          8/8  F. Breathing:   Observed ---------------------------------------------------------------------- Biometry   BPD:      98.5  mm     G. Age:  40w 3d       > 99  %    CI:        77.89   %    70 - 86                                                          FL/HC:      19.4   %    20.1 - 22.1  HC:      353.2  mm     G. Age:  41w 2d       > 99  %    HC/AC:      0.96        0.93 - 1.11  AC:      366.4  mm     G. Age:  40w 4d       > 99  %    FL/BPD:     69.6   %    71 - 87  FL:       68.6  mm     G. Age:  35w 2d         26  %    FL/AC:      18.7   %    20 - 24  Est. FW:    3826  gm      8 lb 7 oz   > 99  % ---------------------------------------------------------------------- OB History  Gravidity:    3          SAB:   2  Living:       0 ---------------------------------------------------------------------- Gestational Age  LMP:           36w 0d        Date:  03/10/21                 EDD:   12/15/21  U/S Today:     39w 3d                                        EDD:   11/21/21  Best:  36w 0d     Det. By:  LMP  (03/10/21)          EDD:   12/15/21 ---------------------------------------------------------------------- Anatomy  Cranium:               Appears normal         Aortic Arch:            Previously seen  Cavum:                 Previously seen        Ductal Arch:            Previously seen  Ventricles:            Previously seen        Diaphragm:              Previously seen  Choroid Plexus:        Previously seen        Stomach:                Appears normal, left                                                                        sided  Cerebellum:            Previously seen        Abdomen:                Previously seen  Posterior Fossa:       Previously seen        Abdominal Wall:         Previously seen  Nuchal Fold:           Previously seen        Cord Vessels:           Previously seen  Face:                  Appears normal         Kidneys:                Appear normal                         (orbits and profile)  Lips:                  Previously seen        Bladder:                Appears normal   Thoracic:              Previously seen        Spine:                  Previously seen  Heart:                 Appears normal         Upper Extremities:      Previously seen                         (4CH, axis, and  situs)  RVOT:                  Previously seen        Lower Extremities:      Previously seen  LVOT:                  Previously seen  Other:  Female gender previously visualized. Heels and 5th digit previously          visualized. VC, 3VV and 3VTV previously visualized. Nasal bone          visualized. ---------------------------------------------------------------------- Cervix Uterus Adnexa  Cervix  Not visualized (advanced GA >24wks)  Uterus  No abnormality visualized.  Right Ovary  No adnexal mass visualized.  Left Ovary  No adnexal mass visualized.  Cul De Sac  No free fluid seen.  Adnexa  No abnormality visualized. ---------------------------------------------------------------------- Impression  Follow up growth due to AMA and NRNST  Normal interval growth with measurements consistent with  large for gestational age.  Good fetal movement and amniotic fluid volume  Biophysical profile 8/8 ---------------------------------------------------------------------- Recommendations  Follow up as clinically indicated. ----------------------------------------------------------------------               Lin Landsman, MD Electronically Signed Final Report   11/18/2021 08:29 am ----------------------------------------------------------------------      Assessment and Plan:     1. Status post cesarean section Based on pictures, the incision is healing well.  There is no evidence of infection.  2. "Baby Blues" Patient tearful today.  She is not endorsing depression.  We will monitor her closely for the next 2 weeks.  She is currently on Zoloft 100 mg daily.  We can increase to 150 mg at 2 weeks postpartum if necessary.  3.  Will check on Khaila by MyChart later this week.  I  discussed the assessment and treatment plan with the patient. The patient was provided an opportunity to ask questions and all were answered. The patient agreed with the plan and demonstrated an understanding of the instructions.   The patient was advised to call back or seek an in-person evaluation/go to the ED if the symptoms worsen or if the condition fails to improve as anticipated.  I provided 15 minutes of telephone time during this encounter.   [name], MD Center for [employer], [facility]

## 2021-12-15 NOTE — Progress Notes (Deleted)
° °  Subjective:    Patient ID: Julia Jimenez, female    DOB: [birth year], 36 y.o.   MRN: [id number]  HPI  Pt is 6 days post partum and healing well.  Sent pictures of incision.  No evidence of infection.  Review of Systems     Objective:   Physical Exam        Assessment & Plan:

## 2021-12-17 ENCOUNTER — Encounter: Payer: Self-pay | Admitting: Family Medicine

## 2021-12-17 ENCOUNTER — Ambulatory Visit: Payer: Self-pay

## 2021-12-17 ENCOUNTER — Ambulatory Visit (INDEPENDENT_AMBULATORY_CARE_PROVIDER_SITE_OTHER): Payer: BC Managed Care – PPO | Admitting: Family Medicine

## 2021-12-17 ENCOUNTER — Other Ambulatory Visit: Payer: Self-pay

## 2021-12-17 ENCOUNTER — Other Ambulatory Visit (HOSPITAL_COMMUNITY): Payer: Self-pay

## 2021-12-17 VITALS — BP 121/83 | HR 94 | Temp 98.6°F | Wt 163.6 lb

## 2021-12-17 VITALS — BP 128/86 | Ht 62.0 in | Wt 175.0 lb

## 2021-12-17 DIAGNOSIS — J069 Acute upper respiratory infection, unspecified: Secondary | ICD-10-CM

## 2021-12-17 DIAGNOSIS — M25512 Pain in left shoulder: Secondary | ICD-10-CM | POA: Diagnosis not present

## 2021-12-17 MED ORDER — DICLOFENAC SODIUM 75 MG PO TBEC
75.0000 mg | DELAYED_RELEASE_TABLET | Freq: Two times a day (BID) | ORAL | 1 refills | Status: DC
Start: 2021-12-17 — End: 2022-02-26
  Filled 2021-12-17: qty 60, 30d supply, fill #0

## 2021-12-17 MED ORDER — BENZOCAINE 15 MG MT LOZG
1.0000 | LOZENGE | OROMUCOSAL | 2 refills | Status: DC | PRN
Start: 2021-12-17 — End: 2022-02-26
  Filled 2021-12-17: qty 18, fill #0

## 2021-12-17 MED ORDER — METHYLPREDNISOLONE ACETATE 40 MG/ML IJ SUSP
40.0000 mg | Freq: Once | INTRAMUSCULAR | Status: AC
Start: 2021-12-17 — End: 2021-12-17
  Administered 2021-12-17: 40 mg via INTRA_ARTICULAR

## 2021-12-17 NOTE — Progress Notes (Signed)
PCP: Pincus Sanes, MD  Subjective:   HPI: Patient is a 36 y.o. female here for left shoulder pain.  Patient reports she woke up on Thursday with severe pain lateral and superior left shoulder. No acute injury or trauma. She had a cesarean section on 1/31 but pain did not start until 2 days later. Has prior history of left shoulder pain over 4 years ago, had subacromial bursitis. She had a trigger point injection left lateral trapezius that helped some. She has been taking ibuprofen. No radiation past the elbow. No numbness or tingling. Pain is worse at nighttime.  Past Medical History:  Diagnosis Date   Allergic rhinitis    Anemia    in high school   Anxiety    Elevated blood pressure reading without diagnosis of hypertension    in context of Adderall initiation   Gestational diabetes    History of chicken pox    Infertility, female    Ovarian cyst, right 2014    Current Outpatient Medications on File Prior to Visit  Medication Sig Dispense Refill   acetaminophen (TYLENOL) 500 MG tablet Take 2 tablets (1,000 mg total) by mouth every 8 (eight) hours as needed (pain). 60 tablet 0   cyclobenzaprine (FLEXERIL) 5 MG tablet TAKE 1 TABLET(5 MG) BY MOUTH AT BEDTIME AS NEEDED FOR MUSCLE SPASMS. STOP IMMEDIATELY IF PREGNANT 30 tablet 2   ibuprofen (ADVIL) 600 MG tablet Take 1 tablet (600 mg total) by mouth every 6 (six) hours as needed. (Patient not taking: Reported on 12/17/2021) 40 tablet 0   methocarbamol (ROBAXIN) 500 MG tablet Take 500 mg by mouth daily as needed for muscle spasms. (Patient not taking: Reported on 12/17/2021)     norethindrone (MICRONOR) 0.35 MG tablet Take 1 tablet (0.35 mg total) by mouth daily. (Patient not taking: Reported on 12/17/2021) 28 tablet 11   oxyCODONE (OXY IR/ROXICODONE) 5 MG immediate release tablet Take 1 tablet (5 mg total) by mouth every 6 (six) hours as needed for severe pain or breakthrough pain. 20 tablet 0   Prenatal Vit-Fe Fumarate-FA (PRENATAL  MULTIVITAMIN) TABS tablet Take 1 tablet by mouth daily at 12 noon.     sertraline (ZOLOFT) 100 MG tablet TAKE 1 TABLET(100 MG) BY MOUTH DAILY 90 tablet 1   No current facility-administered medications on file prior to visit.    Past Surgical History:  Procedure Laterality Date   CESAREAN SECTION N/A 12/09/2021   Procedure: CESAREAN SECTION;  Surgeon: Lesly Dukes, MD;  Location: [facility] LD [facility]S;  Service: Obstetrics;  Laterality: N/A;   CHOLECYSTECTOMY, LAPAROSCOPIC     TONSILLECTOMY AND ADENOIDECTOMY     WISDOM TOOTH EXTRACTION      No Known Allergies  BP 128/86    Ht 5\' 2"  (1.575 m)    Wt 175 lb (79.4 kg)    BMI 32.01 kg/m   No flowsheet data found.  No flowsheet data found.      Objective:  Physical Exam:  Gen: NAD, comfortable in exam room  Neck: No gross deformity, swelling, bruising. TTP left trapezius.  No midline/bony TTP. FROM with pain on lateral rotations, flexion. BUE strength 5/5 except 4/5 external rotation left shoulder Sensation intact to light touch.   2+ equal reflexes in triceps, biceps, brachioradialis tendons. Negative spurlings. NV intact distal BUEs.  Left shoulder: No swelling, ecchymoses.  No gross deformity. No TTP AC joint, biceps tendon. FROM. Negative Hawkins, discomfort with Neers. Negative Yergasons. Strength 5/5 with empty can and resisted internal rotation.  Strength 4/5 with external rotation with pain. Negative apprehension. Negative sulcus. NV intact distally.  Complete MSK u/s left shoulder: Biceps tendon: intact without abnormalities Pec major tendon: intact Subscapularis: intact with very small enthesophyte distal insertion, no tears AC joint: intact without effusion or arthropathy Infraspinatus: intact without abnormalities Supraspinatus: intact without tears but overlying subacromial bursitis Posterior glenohumeral joint: no posterior labral tear, effusion, paralabral cyst.  No abnormalities in spinoglenoid  notch  Impression: Subacromial bursitis  Assessment & Plan:  1. Left shoulder pain - noted pain with Neer's and pain/weakness with infraspinatus testing.  No abnormalities in spinoglenoid notch to suggest suprascapular nerve impingement here.  She has subacromial bursitis with some secondary scap stabilizer/trapezius spasms/pain.  Discussed options - subacromial injection given today.  Diclofenac twice a day with food.  Start home exercise program in a few days.  While she had recent cesarean section she continues to follow with her Ob/Gyn and clearly has findings consistent with musculoskeletal shoulder pain, not Kehr's sign from intraabdominal process.  Advised to call us early next week with update on how she's doing.  After informed written consent timeout was performed, patient was seated in chair in exam room. Left shoulder was prepped with alcohol swab and utilizing lateral approach with ultrasound guidance, patient's left subacromial space was injected with 3:1 lidocaine: depomedrol. Patient tolerated the procedure well without immediate complications.

## 2021-12-17 NOTE — Progress Notes (Signed)
GYNECOLOGY OFFICE VISIT NOTE  History:   Julia Jimenez is a 36 y.o. [phone] here today for fever.  Patient had uncomplicated primary cesarean for suspected fetal macrosomia on 12/09/2021 Low hgb post op, given IV venofer, also had shoulder pain and given trigger point injection Had incision check on 12/15/2021 that was unremarkable  Today reports shoulder still hurting, got cortisone injection earlier today Last night had a fever 100.2 F Has a scratchy voice, sore throat, nasal congestion No ear pain Feels a little short of breath Tested negative for COVID yesterday morning, has not tested today  Both husband and her mother are unwell Feels like her symptoms started about two days ago Endorses some body aches last night  Health Maintenance Due  Topic Date Due   FOOT EXAM  Never done   OPHTHALMOLOGY EXAM  Never done   URINE MICROALBUMIN  Never done   COVID-19 Vaccine (3 - Booster for Pfizer series) 02/01/2020   INFLUENZA VACCINE  06/09/2021   HEMOGLOBIN A1C  11/04/2021    Past Medical History:  Diagnosis Date   Allergic rhinitis    Anemia    in high school   Anxiety    Elevated blood pressure reading without diagnosis of hypertension    in context of Adderall initiation   Gestational diabetes    History of chicken pox    Infertility, female    Ovarian cyst, right 2014    Past Surgical History:  Procedure Laterality Date   CESAREAN SECTION N/A 12/09/2021   Procedure: CESAREAN SECTION;  Surgeon: Lesly Dukes, MD;  Location: [facility] LD [facility];  Service: Obstetrics;  Laterality: N/A;   CHOLECYSTECTOMY, LAPAROSCOPIC     TONSILLECTOMY AND ADENOIDECTOMY     WISDOM TOOTH EXTRACTION      The following portions of the patient's history were reviewed and updated as appropriate: allergies, current medications, past family history, past medical history, past social history, past surgical history and problem list.   Health Maintenance:   Last pap: Lab Results  Component  Value Date   DIAGPAP  05/05/2021    - Negative for intraepithelial lesion or malignancy (NILM)   HPV NOT DETECTED 06/23/2018   HPVHIGH Negative 05/05/2021     Last mammogram:  N/a    Review of Systems:  Pertinent items noted in HPI and remainder of comprehensive ROS otherwise negative.  Physical Exam:  BP 121/83    Pulse 94    Temp 98.6 F (37 C) (Oral)    Wt 163 lb 9.6 oz (74.2 kg)    Breastfeeding Yes    BMI 29.92 kg/m  CONSTITUTIONAL: Well-developed, well-nourished female in no acute distress.  HEENT:  Normocephalic, atraumatic. External right and left ear normal. No scleral icterus. Mildly erythematous oropharynx. NECK: Normal range of motion, supple, no masses noted on observation SKIN: No rash noted. Not diaphoretic. No erythema. No pallor. MUSCULOSKELETAL: Normal range of motion. No edema noted. NEUROLOGIC: Alert and oriented to person, place, and time. Normal muscle tone coordination.  PSYCHIATRIC: Normal mood and affect. Normal behavior. Normal judgment and thought content. RESPIRATORY: Effort normal, no problems with respiration noted. Clear to auscultation bilaterally.  ABDOMEN: No masses noted. No other overt distention noted. Well healed incision without erythema, induration, or discharge.   Labs and Imaging Results for orders placed or performed during the hospital encounter of 12/09/21 (from the past 168 hour(s))  CBC   Collection Time: 12/11/21  8:07 AM  Result Value Ref Range   WBC 8.6 4.0 -  10.5 K/uL   RBC 3.29 (L) 3.87 - 5.11 MIL/uL   Hemoglobin 8.9 (L) 12.0 - 15.0 g/dL   HCT 77.9 (L) 39.0 - 30.0 %   MCV 83.6 80.0 - 100.0 fL   MCH 27.1 26.0 - 34.0 pg   MCHC 32.4 30.0 - 36.0 g/dL   RDW 92.3 30.0 - 76.2 %   Platelets 155 150 - 400 K/uL   nRBC 0.0 0.0 - 0.2 %   [place] MFM FETAL BPP WO NON STRESS  Result Date: 11/18/2021 ----------------------------------------------------------------------  OBSTETRICS REPORT                       (Signed Final 11/18/2021  08:29 am) ---------------------------------------------------------------------- Patient Info  ID #:       [id number]                          D.O.B.:  Mar 05, 1986 (35 yrs)  Name:       Julia Jimenez             Visit Date: 11/17/2021 05:32 pm ---------------------------------------------------------------------- Performed By  Attending:        Lin Landsman      Ref. Address:     [address]                    [place]                                                             [address]                                                             [place], [place]                                                             [address]  Performed By:     Percell Boston          [facility]  Referred By:      Tereso Newcomer MD ---------------------------------------------------------------------- Orders  #  Description                           Code        Ordered By  1  [place] MFM FETAL BPP WO NON  87564.33    Candelaria Celeste     STRESS ----------------------------------------------------------------------  #  Order #                     Accession #                Episode #  1  [id number]                   [id number]                 [id number] ---------------------------------------------------------------------- Indications  Non-reactive NST                               O28.9  [redacted] weeks gestation of pregnancy                Z3A.42  Advanced maternal age multigravida 27+,        O64.523  third trimester  Medical complication of pregnancy -            O26.90  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 Cord Insertion:      Visualized, central  Amniotic Fluid  AFI FV:      Subjectively increased  AFI Sum(cm)     %Tile       Largest Pocket(cm)  23.3            89          8.4  RUQ(cm)       RLQ(cm)       LUQ(cm)        LLQ(cm)  6.9           8.4           3.2            4.8 ---------------------------------------------------------------------- Biophysical Evaluation  Amniotic F.V:   Within normal limits       F. Tone:        Observed  F. Movement:    Observed                   Score:          8/8  F. Breathing:   Observed ---------------------------------------------------------------------- Biometry  BPD:      98.5  mm     G. Age:  40w 3d       > 99  %    CI:        77.89   %    70 - 86                                                          FL/HC:      19.4   %    20.1 - 22.1  HC:  353.2  mm     G. Age:  41w 2d       > 99  %    HC/AC:      0.96        0.93 - 1.11  AC:      366.4  mm     G. Age:  40w 4d       > 99  %    FL/BPD:     69.6   %    71 - 87  FL:       68.6  mm     G. Age:  35w 2d         26  %    FL/AC:      18.7   %    20 - 24  Est. FW:    3826  gm      8 lb 7 oz   > 99  % ---------------------------------------------------------------------- OB History  Gravidity:    3          SAB:   2  Living:       0 ---------------------------------------------------------------------- Gestational Age  LMP:           36w 0d        Date:  03/10/21                 EDD:   12/15/21  U/S Today:     39w 3d                                        EDD:   11/21/21  Best:          36w 0d     Det. By:  LMP  (03/10/21)          EDD:   12/15/21 ---------------------------------------------------------------------- Anatomy  Cranium:               Appears normal         Aortic Arch:            Previously seen  Cavum:                 Previously seen        Ductal Arch:            Previously seen  Ventricles:            Previously seen        Diaphragm:              Previously seen  Choroid Plexus:        Previously seen         Stomach:                Appears normal, left                                                                        sided  Cerebellum:            Previously seen        Abdomen:  Previously seen  Posterior Fossa:       Previously seen        Abdominal Wall:         Previously seen  Nuchal Fold:           Previously seen        Cord Vessels:           Previously seen  Face:                  Appears normal         Kidneys:                Appear normal                         (orbits and profile)  Lips:                  Previously seen        Bladder:                Appears normal  Thoracic:              Previously seen        Spine:                  Previously seen  Heart:                 Appears normal         Upper Extremities:      Previously seen                         (4CH, axis, and                         situs)  RVOT:                  Previously seen        Lower Extremities:      Previously seen  LVOT:                  Previously seen  Other:  Female gender previously visualized. Heels and 5th digit previously          visualized. VC, 3VV and 3VTV previously visualized. Nasal bone          visualized. ---------------------------------------------------------------------- Cervix Uterus Adnexa  Cervix  Not visualized (advanced GA >24wks)  Uterus  No abnormality visualized.  Right Ovary  No adnexal mass visualized.  Left Ovary  No adnexal mass visualized.  Cul De Sac  No free fluid seen.  Adnexa  No abnormality visualized. ---------------------------------------------------------------------- Impression  Follow up growth due to AMA and NRNST  Normal interval growth with measurements consistent with  large for gestational age.  Good fetal movement and amniotic fluid volume  Biophysical profile 8/8 ---------------------------------------------------------------------- Recommendations  Follow up as clinically indicated. ----------------------------------------------------------------------                Lin Landsmanorenthian Booker, MD Electronically Signed Final Report   11/18/2021 08:29 am ----------------------------------------------------------------------     Assessment and Plan:   Problem List Items Addressed This Visit   None Visit Diagnoses     Viral URI    -  Primary      Presentation c/w viral URI. Possibly flu but outside of tamiflu window at this point. Encouraged her to repeat test for COVID, if  positive would be OK sending her paxlovid, though questionable if she is completely within criteria. Discussed OTC meds, sent benzocaine lozenges, tea with honey, tincture of time.   Return if symptoms worsen or fail to improve.    Total face-to-face time with patient: 20 minutes.  Over 50% of encounter was spent on counseling and coordination of care.   Venora Maples, MD/MPH Attending Family Medicine Physician, [facility] for [facility], [facility]

## 2021-12-17 NOTE — Patient Instructions (Signed)
You have rotator cuff impingement, subacromial bursitis. Try to avoid painful activities (overhead activities, lifting with extended arm) as much as possible. Diclofenac 75mg  twice a day with food for pain and inflammation.  Don't take aleve or ibuprofen while on this. Can take tylenol in addition to this. Subacromial injection may be beneficial to help with pain and to decrease inflammation - you were given this today. Consider physical therapy with transition to home exercise program. Do home exercise program with theraband and scapular stabilization exercises daily 3 sets of 10 once a day but wait at least a few days before starting this. Let me know how you're doing early next week even if you feel much better.

## 2021-12-18 ENCOUNTER — Encounter: Payer: Self-pay | Admitting: Family Medicine

## 2021-12-31 ENCOUNTER — Encounter: Payer: Self-pay | Admitting: Obstetrics & Gynecology

## 2022-01-01 ENCOUNTER — Telehealth: Payer: Self-pay | Admitting: *Deleted

## 2022-01-01 NOTE — Telephone Encounter (Cosign Needed)
Received call form Musty [place],RN [facility].  She did a home visit on [place] and pt scored a 13 on [place] scale.  She states that she is still recovering from the C-Section and the baby has been fussy.  She has been in close contact with Dr Penne Lash since her delivery.  I called pt and LM on her phone that I had gotten a call from [facility] nurse and wanted to check in on her.  I told her that if she felt like she would like to talk with a therapist I would put a referral in.  Pt is currently taking Zoloft 100 mg.  Pt did mention to [facility] nurse that each day is a little better.

## 2022-01-02 NOTE — Telephone Encounter (Signed)
Patient was assessed and managed by nursing staff during this encounter. I have reviewed the chart and agree with the documentation and plan. I have also made any necessary editorial changes.  [name], MD 01/02/2022 11:00 AM

## 2022-01-08 ENCOUNTER — Encounter: Payer: Self-pay | Admitting: Obstetrics & Gynecology

## 2022-01-08 ENCOUNTER — Other Ambulatory Visit (HOSPITAL_COMMUNITY): Payer: Self-pay

## 2022-01-08 ENCOUNTER — Other Ambulatory Visit: Payer: Self-pay | Admitting: Obstetrics & Gynecology

## 2022-01-08 MED ORDER — SERTRALINE HCL 100 MG PO TABS
150.0000 mg | ORAL_TABLET | Freq: Every day | ORAL | 6 refills | Status: DC
  Filled 2022-01-08: qty 45, 30d supply, fill #0

## 2022-01-08 NOTE — Progress Notes (Signed)
Discussed with patient about increasing zoloft to 150 mg daily.  Her Edinburgh score was 13.  ?

## 2022-01-27 ENCOUNTER — Encounter: Payer: Self-pay | Admitting: Obstetrics and Gynecology

## 2022-01-29 ENCOUNTER — Encounter: Payer: Self-pay | Admitting: Obstetrics and Gynecology

## 2022-01-29 ENCOUNTER — Ambulatory Visit (INDEPENDENT_AMBULATORY_CARE_PROVIDER_SITE_OTHER): Payer: BC Managed Care – PPO | Admitting: Obstetrics and Gynecology

## 2022-01-29 ENCOUNTER — Other Ambulatory Visit: Payer: Self-pay

## 2022-01-29 VITALS — BP 113/75 | HR 94 | Ht 62.0 in | Wt 164.0 lb

## 2022-01-29 DIAGNOSIS — Z8632 Personal history of gestational diabetes: Secondary | ICD-10-CM

## 2022-01-29 DIAGNOSIS — Z30430 Encounter for insertion of intrauterine contraceptive device: Secondary | ICD-10-CM

## 2022-01-29 LAB — POCT URINE PREGNANCY: Preg Test, Ur: NEGATIVE

## 2022-01-29 MED ORDER — LEVONORGESTREL 20 MCG/DAY IU IUD
1.0000 | INTRAUTERINE_SYSTEM | Freq: Once | INTRAUTERINE | Status: AC
  Administered 2022-01-29: 1 via INTRAUTERINE

## 2022-01-29 MED ORDER — IBUPROFEN 200 MG PO TABS
600.0000 mg | ORAL_TABLET | Freq: Once | ORAL | Status: DC
Start: 2022-01-29 — End: 2022-02-26

## 2022-01-29 NOTE — Progress Notes (Signed)
? ? ?Post Partum Visit Note ? ?Julia Jimenez is a 36 y.o. [phone] female who presents for a postpartum visit. She is 7 weeks postpartum following a primary cesarean section.  I have fully reviewed the prenatal and intrapartum course. The delivery was at 39.1 gestational weeks.  Anesthesia: spinal. Postpartum course has been mentally challenging but, doing better now. Baby is doing well. Baby is feeding by bottle - Enfamil Nutramigen. Bleeding no bleeding. Bowel function is normal. Bladder function is normal. Patient is not sexually active. Contraception method is IUD. Postpartum depression screening: negative. ? ? ?The pregnancy intention screening data noted above was reviewed. Potential methods of contraception were discussed. The patient elected to proceed with No data recorded. ? ? Edinburgh Postnatal Depression Scale - 01/29/22 0843   ? ?  ? Edinburgh Postnatal Depression Scale:  In the Past 7 Days  ? I have been able to laugh and see the funny side of things. 0   ? I have looked forward with enjoyment to things. 0   ? I have blamed myself unnecessarily when things went wrong. 1   ? I have been anxious or worried for no good reason. 2   ? I have felt scared or panicky for no good reason. 0   ? Things have been getting on top of me. 1   ? I have been so unhappy that I have had difficulty sleeping. 0   ? I have felt sad or miserable. 0   ? I have been so unhappy that I have been crying. 0   ? The thought of harming myself has occurred to me. 0   ? Edinburgh Postnatal Depression Scale Total 4   ? ?  ?  ? ?  ? ? ?Health Maintenance Due  ?Topic Date Due  ? FOOT EXAM  Never done  ? OPHTHALMOLOGY EXAM  Never done  ? URINE MICROALBUMIN  Never done  ? COVID-19 Vaccine (3 - Booster for Pfizer series) 02/01/2020  ? INFLUENZA VACCINE  06/09/2021  ? HEMOGLOBIN A1C  11/04/2021  ? ? ?The following portions of the patient's history were reviewed and updated as appropriate: allergies, current medications, past family  history, past medical history, past social history, past surgical history, and problem list. ? ?Review of Systems ?Pertinent items are noted in HPI. ? ?Objective:  ?BP 113/75   Pulse 94   Ht 5\' 2"  (1.575 m)   Wt 164 lb (74.4 kg)   Breastfeeding No   BMI 30.00 kg/m?   ? ?General:  alert, cooperative, and no distress  ? Breasts:  not indicated  ?Lungs: clear to auscultation bilaterally  ?Heart:  regular rate and rhythm  ?Abdomen: soft, non-tender; bowel sounds normal; no masses,  no organomegaly   ?Wound well approximated incision  ?GU exam:  normal  ? ? ? ?GYNECOLOGY OFFICE PROCEDURE NOTE ? ?IUD Insertion Procedure Note ?Procedure: IUD insertion with Mirena ?UPT: Negative ?GC/CT testing: Up to date ? ?Patient identified.  Risks, benefits and alternatives of procedure were discussed including irregular bleeding, cramping, infection, malpositioning or misplacement of the IUD outside the uterus which may require further procedure such as laparoscopy. Also discussed >99% contraception efficacy, increased risk of ectopic pregnancy with failure of method.   Emphasized that this did not protect against STIs, condoms recommended during all sexual encounters. Consent signed. Time out performed.  ? ?Speculum inserted and cervix visualized, prepped with 3 swabs of betadine.  ? ?Grasped with a single tooth tenaculum and IUD  then inserted without difficulty per manufacturer's instructions and strings cut to 3 cm below cervical os and all instruments removed. Pt tolerated well with minimal pain and bleeding. ? ?Discussed concerning signs/symptoms and to call if heavy bleeding, severe abdominal pain, or fever in the following 3 weeks. Manufacturer pamphlet/patient information given. Reviewed timing of efficacy for contraception and to use an alternative form of birth control until that time. ? ? ? ?Assessment:  ?Julia Jimenez was seen today for postpartum care. ? ?Diagnoses and all orders for this visit: ? ?Encounter for IUD  insertion ?-     levonorgestrel (MIRENA) 20 MCG/DAY IUD 1 each ?-     POCT urine pregnancy ? ?History of gestational diabetes ?-     2Hr GTT w/ 1 Hr Carpenter 75 g ? ?Postpartum care and examination ? ?Other orders ?-     ibuprofen (ADVIL) tablet 600 mg ? ? ? ?Plan:  ? ?Essential components of care per ACOG recommendations: ? ?1.  Mood and well being: Patient with negative depression screening today. Reviewed local resources for support.  ?- Patient tobacco use? No.   ?- hx of drug use? No.   ? ?2. Infant care and feeding:  ?-Patient currently breastmilk feeding? No ?-Social determinants of health (SDOH) reviewed in EPIC. No concerns ? ?3. Sexuality, contraception and birth spacing ?- Patient does not want a pregnancy in the next year.  Desired family size is 1 children.  ?- Reviewed reproductive life planning. Reviewed contraceptive methods based on pt preferences and effectiveness.  Patient desired IUD or IUS today.   ?- Discussed birth spacing of 18 months ? ?4. Sleep and fatigue ?-Encouraged family/partner/community support of 4 hrs of uninterrupted sleep to help with mood and fatigue ? ?5. Physical Recovery  ?- Discussed patients delivery and complications. She describes her labor as good. ?- Patient had a C-section. ?- Patient has urinary incontinence? No. ?- Patient is safe to resume physical and sexual activity ? ?6.  Health Maintenance ?- HM due items addressed Yes ?- Last pap smear  ?Diagnosis  ?Date Value Ref Range Status  ?05/05/2021   Final  ? - Negative for intraepithelial lesion or malignancy (NILM)  ? Pap smear not done at today's visit.  ?-Breast Cancer screening indicated? No.  ? ?7. Chronic Disease/Pregnancy Condition follow up: Gestational Diabetes ? ?- PCP follow up ? ?Milas Hock, MD ?Center for [facility], [facility]  ?

## 2022-01-30 LAB — 2HR GTT W 1 HR, CARPENTER, 75 G
Glucose, 1 Hr, Gest: 182 mg/dL — ABNORMAL HIGH (ref 65–179)
Glucose, 2 Hr, Gest: 70 mg/dL (ref 65–152)
Glucose, Fasting, Gest: 94 mg/dL — ABNORMAL HIGH (ref 65–91)

## 2022-02-02 ENCOUNTER — Encounter: Payer: Self-pay | Admitting: Obstetrics and Gynecology

## 2022-02-02 ENCOUNTER — Encounter: Payer: Self-pay | Admitting: Internal Medicine

## 2022-02-23 ENCOUNTER — Encounter: Payer: Self-pay | Admitting: Internal Medicine

## 2022-02-26 ENCOUNTER — Encounter: Payer: Self-pay | Admitting: Internal Medicine

## 2022-02-26 NOTE — Progress Notes (Signed)
? ? ? ? ?  Subjective:  ? ? Patient ID: Julia Jimenez, female    DOB: February 28, 1986, 36 y.o.   MRN: [id number] ? ?This visit occurred during the SARS-CoV-2 public health emergency.  Safety protocols were in place, including screening questions prior to the visit, additional usage of staff PPE, and extensive cleaning of exam room while observing appropriate contact time as indicated for disinfecting solutions.   ? ? ?HPI ?Julia Jimenez is here for follow up of her gestational diabetes / to have an a1c. ? ?She had gestational diabetes while she was pregnant.  She controlled the sugar with diet.  Her post partum glucose test for positive.  She has a very strong history of diabetes.  ? ?She wonders if she needs a medication to help with sugar and may help lose weight loss.  ? ?She is walking for exercise.  She does eat fairly healthy, but does have issues with portions. ? ?Left shoulder bursitis.  Had two steroid injection.  Has weakness in arm now. First injection was trigger oint by dr leggett and second injection - US guided by sports med in bursa.    No pain - just weakness - not gettering better ? ? ? ?Medications and allergies reviewed with patient and updated if appropriate. ? ?Current Outpatient Medications on File Prior to Visit  ?Medication Sig Dispense Refill  ? Prenatal Vit-Fe Fumarate-FA (PRENATAL MULTIVITAMIN) TABS tablet Take 1 tablet by mouth daily at 12 noon.    ? sertraline (ZOLOFT) 100 MG tablet Take 1.5 tablets (150 mg total) by mouth daily. 45 tablet 6  ? VITAMIN D, CHOLECALCIFEROL, PO Take by mouth.    ? ?No current facility-administered medications on file prior to visit.  ? ? ? ?Review of Systems ? ?   ?Objective:  ? ?Vitals:  ? 02/27/22 1416  ?BP: 110/78  ?Pulse: 96  ?Temp: 98.3 ?F (36.8 ?C)  ?SpO2: 99%  ? ?BP Readings from Last 3 Encounters:  ?02/27/22 110/78  ?01/29/22 113/75  ?12/17/21 121/83  ? ?Wt Readings from Last 3 Encounters:  ?02/27/22 168 lb (76.2 kg)  ?01/29/22 164 lb (74.4 kg)  ?12/17/21  163 lb 9.6 oz (74.2 kg)  ? ?Body mass index is 30.73 kg/m?. ? ?  ?Physical Exam ?Constitutional:   ?   Appearance: Normal appearance.  ?HENT:  ?   Head: Normocephalic.  ?Skin: ?   General: Skin is warm and dry.  ?Neurological:  ?   Mental Status: She is alert.  ? ?   ? ?Lab Results  ?Component Value Date  ? WBC 8.6 12/11/2021  ? HGB 8.9 (L) 12/11/2021  ? HCT 27.5 (L) 12/11/2021  ? PLT 155 12/11/2021  ? GLUCOSE 85 06/02/2021  ? CHOL 162 03/20/2020  ? TRIG 56.0 03/20/2020  ? HDL 65.90 03/20/2020  ? LDLCALC 85 03/20/2020  ? ALT 20 06/02/2021  ? AST 11 06/02/2021  ? NA 136 06/02/2021  ? K 4.0 06/02/2021  ? CL 101 06/02/2021  ? CREATININE 0.53 06/02/2021  ? BUN 11 06/02/2021  ? CO2 26 06/02/2021  ? TSH 1.03 05/05/2021  ? HGBA1C 5.3 05/05/2021  ? ?A1c today 5.4% ? ?Assessment & Plan:  ? ? ?See Problem List for Assessment and Plan of chronic medical problems.  ? ? ?

## 2022-02-26 NOTE — Patient Instructions (Addendum)
? ? ?  Your a1c is 5.4% ? ? ? ?Medications changes include :  Wegovy 0.25 mg weekly ? ? ? ?Your prescription(s) have been sent to your pharmacy.  ? ? ?

## 2022-02-27 ENCOUNTER — Other Ambulatory Visit (HOSPITAL_COMMUNITY): Payer: Self-pay

## 2022-02-27 ENCOUNTER — Ambulatory Visit (INDEPENDENT_AMBULATORY_CARE_PROVIDER_SITE_OTHER): Payer: BC Managed Care – PPO | Admitting: Internal Medicine

## 2022-02-27 DIAGNOSIS — E7439 Other disorders of intestinal carbohydrate absorption: Secondary | ICD-10-CM | POA: Diagnosis not present

## 2022-02-27 DIAGNOSIS — E669 Obesity, unspecified: Secondary | ICD-10-CM | POA: Diagnosis not present

## 2022-02-27 DIAGNOSIS — E66811 Obesity, class 1: Secondary | ICD-10-CM | POA: Insufficient documentation

## 2022-02-27 LAB — POCT GLYCOSYLATED HEMOGLOBIN (HGB A1C)
HbA1c POC (<> result, manual entry): 5.4 % (ref 4.0–5.6)
HbA1c, POC (controlled diabetic range): 5.4 % (ref 0.0–7.0)
HbA1c, POC (prediabetic range): 5.4 % — AB (ref 5.7–6.4)
Hemoglobin A1C: 5.4 % (ref 4.0–5.6)

## 2022-02-27 MED ORDER — SEMAGLUTIDE-WEIGHT MANAGEMENT 0.25 MG/0.5ML ~~LOC~~ SOAJ
0.2500 mg | SUBCUTANEOUS | 0 refills | Status: DC
Start: 2022-02-27 — End: 2022-03-08
  Filled 2022-02-27: qty 2, 28d supply, fill #0

## 2022-02-27 NOTE — Assessment & Plan Note (Signed)
New ?Gave birth almost 3 months ago ?Having difficulty losing weight ?History of gestational diabetes, glucose intolerance ?She will work on decreasing portions-overall eats pretty healthy ?Keep alcohol to a minimum ?She will exercise is much as possible, but does have a baby and is working full-time so it is difficult ?Trial of Wegovy 0.25 mg weekly-we will titrate if tolerated ?

## 2022-02-27 NOTE — Assessment & Plan Note (Signed)
New ?She had gestational diabetes and even postpartum tested positive for glucose intolerance ?She has a very strong history of diabetes and is high risk for developing diabetes in the future ?Discussed diabetic diet, regular exercise and weight loss ?A1c here today is 5.4% ?Could consider metformin since she does have glucose intolerance ?She will work on decreasing her portions, continuing as much exercise as possible ?We will try Wegovy to see if that helps with weight loss ?

## 2022-02-27 NOTE — Addendum Note (Signed)
Addended by: Karma Ganja on: 02/27/2022 04:31 PM ? ? Modules accepted: Orders ? ?

## 2022-03-03 ENCOUNTER — Encounter: Payer: Self-pay | Admitting: Internal Medicine

## 2022-03-04 NOTE — Telephone Encounter (Signed)
Rec'd msg off cover-my-meds pt is needing PA for [facility]. Completed w/(Key: [id number]). PA went to insurance for determination../l,mb ?

## 2022-03-05 ENCOUNTER — Encounter: Payer: Self-pay | Admitting: Internal Medicine

## 2022-03-05 MED ORDER — TOPIRAMATE 25 MG PO TABS: 25.0000 mg | ORAL_TABLET | Freq: Every day | ORAL | 2 refills | Status: DC

## 2022-03-05 MED ORDER — PHENTERMINE HCL 15 MG PO CAPS: 15.0000 mg | ORAL_CAPSULE | ORAL | 2 refills | Status: DC

## 2022-03-05 NOTE — Addendum Note (Signed)
Addended by: Pincus Sanes on: 03/05/2022 07:15 PM ? ? Modules accepted: Orders ? ?

## 2022-03-05 NOTE — Telephone Encounter (Signed)
Rec'd determination fax from [facility] med was not approved. It states " Is not a covered benefit per your plan". Information has been sent to pt from insurance..Julia Jimenez ?

## 2022-03-08 MED ORDER — PHENTERMINE HCL 15 MG PO CAPS: 15.0000 mg | ORAL_CAPSULE | ORAL | 2 refills | Status: DC

## 2022-03-08 MED ORDER — TOPIRAMATE 25 MG PO TABS: 25.0000 mg | ORAL_TABLET | Freq: Every day | ORAL | 2 refills | Status: DC

## 2022-03-08 NOTE — Addendum Note (Signed)
Addended by: Pincus Sanes on: 03/08/2022 01:42 PM ? ? Modules accepted: Orders ? ?

## 2022-03-23 ENCOUNTER — Encounter: Payer: Self-pay | Admitting: Internal Medicine

## 2022-05-19 ENCOUNTER — Ambulatory Visit (HOSPITAL_COMMUNITY)
Admission: EM | Admit: 2022-05-19 | Discharge: 2022-05-19 | Disposition: A | Discharge status: Home / Self Care | Payer: BC Managed Care – PPO | Attending: Internal Medicine | Admitting: Internal Medicine

## 2022-05-19 ENCOUNTER — Other Ambulatory Visit: Payer: Self-pay

## 2022-05-19 ENCOUNTER — Other Ambulatory Visit (HOSPITAL_COMMUNITY): Payer: Self-pay

## 2022-05-19 ENCOUNTER — Encounter: Payer: Self-pay | Admitting: Internal Medicine

## 2022-05-19 ENCOUNTER — Encounter (HOSPITAL_COMMUNITY): Payer: Self-pay | Admitting: *Deleted

## 2022-05-19 DIAGNOSIS — J020 Streptococcal pharyngitis: Secondary | ICD-10-CM

## 2022-05-19 LAB — POCT RAPID STREP A, ED / UC: Streptococcus, Group A Screen (Direct): POSITIVE — AB

## 2022-05-19 MED ORDER — AMOXICILLIN 500 MG PO CAPS
500.0000 mg | ORAL_CAPSULE | Freq: Two times a day (BID) | ORAL | 0 refills | Status: AC
  Filled 2022-05-19: qty 20, 10d supply, fill #0

## 2022-05-19 NOTE — Discharge Instructions (Addendum)
Please take the medication as prescribed for the full 10 days.  I recommend to take with food to avoid upset stomach. You are contagious until 24 hours on the antibiotic.  Continue to drink lots of fluids.  If you have any worsening symptoms please go to the emergency department

## 2022-05-19 NOTE — ED Triage Notes (Signed)
Pt reports having chills ,sore throat and wants to be checked for strep throat. Pt's 32 month old baby had Hand Foot and Mouth last week.

## 2022-05-19 NOTE — ED Provider Notes (Signed)
[facility]    CSN: [id number] Arrival date & time: 05/19/22  0847     History   Chief Complaint Chief Complaint  Patient presents with   Sore Throat   Fever   Chills    HPI Julia Jimenez is a 36 y.o. female.  Presents with 2-day history of sore throat.  Reports subjective fever at home with chills on and off.  Has not taken her temperature. Reports the sore throat has been worsening.  Pain with swallowing.  Feels her lymph nodes are swollen.  Denies abdominal pain, vomiting/diarrhea, rash.  No cough or congestion.  Baby is in daycare with [facility].  Past Medical History:  Diagnosis Date   Allergic rhinitis    Anemia    in high school   Anxiety    Elevated blood pressure reading without diagnosis of hypertension    in context of Adderall initiation   Gestational diabetes    History of chicken pox    Infertility, female    Ovarian cyst, right 2014    Patient Active Problem List   Diagnosis Date Noted   Glucose intolerance 02/27/2022   Obesity (BMI 30.0-34.9) 02/27/2022   Left shoulder pain 12/11/2021   Delivery by elective cesarean section 12/09/2021   Large for gestational age fetus affecting management of mother, antepartum, third trimester, fetus 1 11/24/2021   Gestational diabetes mellitus (GDM) affecting pregnancy, antepartum 11/24/2021   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   Encounter for supervision of high risk multigravida of advanced maternal age, antepartum 04/29/2021   History of recurrent miscarriages 02/10/2021   Chronic low back pain without sciatica 11/29/2020   Lumbar radiculopathy 03/23/2020   Chronic maxillary sinusitis 11/10/2018   Depression 09/10/2018   Sleep difficulties 09/10/2018   Family history of diabetes mellitus 09/24/2017   Bursitis of shoulder, left 05/04/2017   Anxiety 09/22/2015   Non-allergic rhinitis 06/28/2014    Past Surgical History:  Procedure Laterality Date   CESAREAN SECTION N/A 12/09/2021    Procedure: CESAREAN SECTION;  Surgeon: Lesly Dukes, MD;  Location: [facility];  Service: Obstetrics;  Laterality: N/A;   CHOLECYSTECTOMY, LAPAROSCOPIC     TONSILLECTOMY AND ADENOIDECTOMY     WISDOM TOOTH EXTRACTION      OB History     Gravida  3   Para  1   Term  1   Preterm  0   AB  2   Living  1      SAB  2   IAB  0   Ectopic  0   Multiple  0   Live Births  1            Home Medications    Prior to Admission medications   Medication Sig Start Date End Date Taking? Authorizing Provider  amoxicillin (AMOXIL) 500 MG capsule Take 1 capsule (500 mg total) by mouth 2 (two) times daily for 10 days. 05/19/22 05/29/22 Yes Arul Farabee, Lurena Joiner, PA-C  phentermine 15 MG capsule Take 1 capsule (15 mg total) by mouth every morning. 03/08/22   Pincus Sanes, MD  Prenatal Vit-Fe Fumarate-FA (PRENATAL MULTIVITAMIN) TABS tablet Take 1 tablet by mouth daily at 12 noon.    [provider]  sertraline (ZOLOFT) 100 MG tablet Take 1.5 tablets (150 mg total) by mouth daily. 01/08/22   Lesly Dukes, MD  topiramate (TOPAMAX) 25 MG tablet Take 1 tablet (25 mg total) by mouth daily. 03/08/22   Pincus Sanes, MD  VITAMIN  D, CHOLECALCIFEROL, PO Take by mouth.    [provider]    Family History Family History  Problem Relation Age of Onset   Hypertension Mother    Depression Mother    Anxiety disorder Mother    Diabetes Father    Hypertension Brother    Depression Brother    Diabetes Maternal Aunt    Stroke Maternal Uncle    Diabetes Maternal Uncle        x2   Diabetes Paternal Grandmother    Heart attack Paternal Grandmother    Cancer Maternal Grandfather        Dec CA of unknown origin   Hepatitis C Maternal Grandmother        Dec    Social History Social History   Tobacco Use   Smoking status: Never   Smokeless tobacco: Never  Vaping Use   Vaping Use: Never used  Substance Use Topics   Alcohol use: Not Currently    Alcohol/week: 2.0  standard drinks of alcohol    Types: 2 Standard drinks or equivalent per week    Comment: occasionally   Drug use: No     Allergies   Patient has no known allergies.   Review of Systems Review of Systems Per HPI  Physical Exam Triage Vital Signs ED Triage Vitals  Enc Vitals Group     BP 05/19/22 1002 115/77     Pulse Rate 05/19/22 1002 88     Resp 05/19/22 1002 18     Temp 05/19/22 1002 97.8 F (36.6 C)     Temp src --      SpO2 05/19/22 1002 100 %     Weight --      Height --      Head Circumference --      Peak Flow --      Pain Score 05/19/22 0959 8     Pain Loc --      Pain Edu? --      Excl. in GC? --    No data found.  Updated Vital Signs BP 115/77   Pulse 88   Temp 97.8 F (36.6 C)   Resp 18   LMP 05/18/2022   SpO2 100%    Physical Exam Vitals and nursing note reviewed.  Constitutional:      General: She is not in acute distress. HENT:     Right Ear: Tympanic membrane and ear canal normal.     Left Ear: Tympanic membrane and ear canal normal.     Mouth/Throat:     Mouth: Mucous membranes are moist.     Pharynx: Uvula midline. Posterior oropharyngeal erythema present.     Tonsils: No tonsillar exudate or tonsillar abscesses.  Eyes:     Conjunctiva/sclera: Conjunctivae normal.     Pupils: Pupils are equal, round, and reactive to light.  Cardiovascular:     Rate and Rhythm: Normal rate and regular rhythm.     Heart sounds: Normal heart sounds.  Pulmonary:     Effort: Pulmonary effort is normal. No respiratory distress.     Breath sounds: Normal breath sounds. No wheezing.  Abdominal:     General: Bowel sounds are normal.     Palpations: Abdomen is soft.     Tenderness: There is no abdominal tenderness.  Musculoskeletal:     Cervical back: Normal range of motion.  Lymphadenopathy:     Cervical: Cervical adenopathy present.  Skin:    Findings: No rash.  Neurological:  Mental Status: She is alert and oriented to person, place, and  time.     UC Treatments / Results  Labs (all labs ordered are listed, but only abnormal results are displayed) Labs Reviewed  POCT RAPID STREP A, ED / UC - Abnormal; Notable for the following components:      Result Value   Streptococcus, Group A Screen (Direct) POSITIVE (*)    All other components within normal limits    EKG  Radiology No results found.  Procedures Procedures  Medications Ordered in UC Medications - No data to display  Initial Impression / Assessment and Plan / UC Course  I have reviewed the triage vital signs and the nursing notes.  Pertinent labs & imaging results that were available during my care of the patient were reviewed by me and considered in my medical decision making (see chart for details).  Strep positive.  Amoxicillin twice daily for 10 days. Understands she is contagious until 24 hours on antibiotic. Return precautions discussed. Patient discharged in stable condition.   Final Clinical Impressions(s) / UC Diagnoses   Final diagnoses:  Strep throat     Discharge Instructions      Please take the medication as prescribed for the full 10 days.  I recommend to take with food to avoid upset stomach. You are contagious until 24 hours on the antibiotic.  Continue to drink lots of fluids.  If you have any worsening symptoms please go to the emergency department    ED Prescriptions     Medication Sig Dispense Auth. Provider   amoxicillin (AMOXIL) 500 MG capsule Take 1 capsule (500 mg total) by mouth 2 (two) times daily for 10 days. 20 capsule Enijah Furr, Lurena Joiner, PA-C      PDMP not reviewed this encounter.   Sapir Lavey, Lurena Joiner, [place] 05/19/22 1048

## 2022-05-25 ENCOUNTER — Encounter: Payer: Self-pay | Admitting: Obstetrics & Gynecology

## 2022-05-27 ENCOUNTER — Other Ambulatory Visit: Payer: Self-pay

## 2022-05-27 DIAGNOSIS — N644 Mastodynia: Secondary | ICD-10-CM

## 2022-05-27 NOTE — Progress Notes (Signed)
Right breast U/S and diagnostic right breast mammogram orders placed per Dr.Leggett

## 2022-05-28 ENCOUNTER — Other Ambulatory Visit: Payer: Self-pay

## 2022-05-28 DIAGNOSIS — N644 Mastodynia: Secondary | ICD-10-CM

## 2022-05-28 NOTE — Progress Notes (Signed)
Mammogram order placed per Dr.Leggett

## 2022-05-29 ENCOUNTER — Other Ambulatory Visit: Payer: Self-pay

## 2022-05-29 NOTE — Progress Notes (Signed)
error 

## 2022-06-01 ENCOUNTER — Other Ambulatory Visit: Payer: Self-pay

## 2022-06-01 DIAGNOSIS — N644 Mastodynia: Secondary | ICD-10-CM

## 2022-06-09 ENCOUNTER — Other Ambulatory Visit: Payer: Self-pay | Admitting: Internal Medicine

## 2022-06-10 ENCOUNTER — Other Ambulatory Visit: Payer: Self-pay | Admitting: Internal Medicine

## 2022-06-11 ENCOUNTER — Telehealth: Payer: Self-pay

## 2022-06-11 ENCOUNTER — Other Ambulatory Visit: Payer: Self-pay | Admitting: Internal Medicine

## 2022-06-11 NOTE — Telephone Encounter (Signed)
LVM for pt to call back regards to med

## 2022-06-12 ENCOUNTER — Ambulatory Visit
Admission: RE | Admit: 2022-06-12 | Discharge: 2022-06-12 | Disposition: A | Discharge status: Home / Self Care | Payer: BC Managed Care – PPO | Source: Ambulatory Visit | Attending: Obstetrics & Gynecology | Admitting: Obstetrics & Gynecology

## 2022-06-12 DIAGNOSIS — N644 Mastodynia: Secondary | ICD-10-CM | POA: Diagnosis not present

## 2022-06-12 DIAGNOSIS — R922 Inconclusive mammogram: Secondary | ICD-10-CM | POA: Diagnosis not present

## 2022-07-02 ENCOUNTER — Encounter: Payer: Self-pay | Admitting: Internal Medicine

## 2022-07-02 NOTE — Progress Notes (Unsigned)
Subjective:    Patient ID: Julia Jimenez, female    DOB: 1986-06-12, 36 y.o.   MRN: [id number]     HPI Ahnesty is here for follow up of her chronic medical problems, including anxiety, depression, overweight  Doing optivia.  She binges at night.  Having difficulty losing weight - she is very frustrated and needs help.   Her baby is 26 months old.    She is not able to get to the gym.  She walks on the weekends.     Medications and allergies reviewed with patient and updated if appropriate.  Current Outpatient Medications on File Prior to Visit  Medication Sig Dispense Refill   cyclobenzaprine (FLEXERIL) 5 MG tablet Take 5 mg by mouth at bedtime as needed.     MIRENA, 52 MG, 20 MCG/DAY IUD      Prenatal Vit-Fe Fumarate-FA (PRENATAL MULTIVITAMIN) TABS tablet Take 1 tablet by mouth daily at 12 noon.     VITAMIN D, CHOLECALCIFEROL, PO Take by mouth.     Zinc Sulfate (ZINC 15 PO) Take by mouth.     No current facility-administered medications on file prior to visit.     Review of Systems  Constitutional:  Negative for fever.  Gastrointestinal:  Negative for abdominal pain, constipation and nausea.       Objective:   Vitals:   07/03/22 0825  BP: 106/76  Pulse: (!) 102  Temp: 98.6 F (37 C)  SpO2: 99%   BP Readings from Last 3 Encounters:  07/03/22 106/76  05/19/22 115/77  02/27/22 110/78   Wt Readings from Last 3 Encounters:  07/03/22 162 lb (73.5 kg)  02/27/22 168 lb (76.2 kg)  01/29/22 164 lb (74.4 kg)   Body mass index is 29.63 kg/m.    Physical Exam Constitutional:      General: She is not in acute distress.    Appearance: Normal appearance.  HENT:     Head: Normocephalic and atraumatic.  Eyes:     Conjunctiva/sclera: Conjunctivae normal.  Cardiovascular:     Rate and Rhythm: Normal rate and regular rhythm.     Heart sounds: Normal heart sounds. No murmur heard. Pulmonary:     Effort: Pulmonary effort is normal. No respiratory  distress.     Breath sounds: Normal breath sounds. No wheezing.  Musculoskeletal:     Cervical back: Neck supple.     Right lower leg: No edema.     Left lower leg: No edema.  Lymphadenopathy:     Cervical: No cervical adenopathy.  Skin:    General: Skin is warm and dry.     Findings: No rash.  Neurological:     Mental Status: She is alert. Mental status is at baseline.  Psychiatric:        Mood and Affect: Mood normal.        Behavior: Behavior normal.        Lab Results  Component Value Date   WBC 8.6 12/11/2021   HGB 8.9 (L) 12/11/2021   HCT 27.5 (L) 12/11/2021   PLT 155 12/11/2021   GLUCOSE 85 06/02/2021   CHOL 162 03/20/2020   TRIG 56.0 03/20/2020   HDL 65.90 03/20/2020   LDLCALC 85 03/20/2020   ALT 20 06/02/2021   AST 11 06/02/2021   NA 136 06/02/2021   K 4.0 06/02/2021   CL 101 06/02/2021   CREATININE 0.53 06/02/2021   BUN 11 06/02/2021   CO2 26 06/02/2021   TSH  1.03 05/05/2021   HGBA1C 5.4 02/27/2022   HGBA1C 5.4 02/27/2022   HGBA1C 5.4 (A) 02/27/2022   HGBA1C 5.4 02/27/2022     Assessment & Plan:    See Problem List for Assessment and Plan of chronic medical problems.

## 2022-07-02 NOTE — Patient Instructions (Addendum)
      Medications changes include :   inject 0.25 mg weekly x one month then increase 0.5 mg weekly   Your prescription(s) have been sent to your pharmacy.    Return in about 6 months (around 01/03/2023) for follow up.

## 2022-07-03 ENCOUNTER — Ambulatory Visit (INDEPENDENT_AMBULATORY_CARE_PROVIDER_SITE_OTHER): Payer: BC Managed Care – PPO | Admitting: Internal Medicine

## 2022-07-03 ENCOUNTER — Other Ambulatory Visit (HOSPITAL_COMMUNITY): Payer: Self-pay

## 2022-07-03 VITALS — BP 106/76 | HR 102 | Temp 98.6°F | Ht 62.0 in | Wt 162.0 lb

## 2022-07-03 DIAGNOSIS — E669 Obesity, unspecified: Secondary | ICD-10-CM

## 2022-07-03 DIAGNOSIS — F3289 Other specified depressive episodes: Secondary | ICD-10-CM

## 2022-07-03 DIAGNOSIS — F419 Anxiety disorder, unspecified: Secondary | ICD-10-CM | POA: Diagnosis not present

## 2022-07-03 MED ORDER — SERTRALINE HCL 100 MG PO TABS
100.0000 mg | ORAL_TABLET | Freq: Every day | ORAL | 3 refills | Status: DC
  Filled 2022-07-03: qty 90, 90d supply, fill #0
  Filled 2022-10-13: qty 90, 90d supply, fill #1
  Filled 2023-01-25: qty 90, 90d supply, fill #2

## 2022-07-03 MED ORDER — OZEMPIC (0.25 OR 0.5 MG/DOSE) 2 MG/3ML ~~LOC~~ SOPN
PEN_INJECTOR | SUBCUTANEOUS | 3 refills | Status: AC
  Filled 2022-07-03: qty 3, 56d supply, fill #0
  Filled 2022-08-07: qty 3, 28d supply, fill #1
  Filled 2022-09-17: qty 3, 28d supply, fill #2
  Filled 2022-10-13: qty 3, 28d supply, fill #3

## 2022-07-03 NOTE — Assessment & Plan Note (Signed)
Chronic Controlled, Stable Continue sertraline 150 mg daily 

## 2022-07-03 NOTE — Assessment & Plan Note (Signed)
Chronic Exercising regularly Eating healthy and trying to keep portion small On Topamax 25 mg daily

## 2022-07-15 ENCOUNTER — Other Ambulatory Visit (HOSPITAL_COMMUNITY): Payer: Self-pay

## 2022-08-06 ENCOUNTER — Ambulatory Visit: Payer: BC Managed Care – PPO | Admitting: Orthopaedic Surgery

## 2022-08-06 ENCOUNTER — Encounter: Payer: Self-pay | Admitting: Orthopaedic Surgery

## 2022-08-06 ENCOUNTER — Other Ambulatory Visit (HOSPITAL_COMMUNITY): Payer: Self-pay

## 2022-08-06 ENCOUNTER — Ambulatory Visit (INDEPENDENT_AMBULATORY_CARE_PROVIDER_SITE_OTHER): Payer: BC Managed Care – PPO

## 2022-08-06 DIAGNOSIS — G8929 Other chronic pain: Secondary | ICD-10-CM | POA: Diagnosis not present

## 2022-08-06 DIAGNOSIS — M79645 Pain in left finger(s): Secondary | ICD-10-CM

## 2022-08-06 MED ORDER — CELECOXIB 200 MG PO CAPS
200.0000 mg | ORAL_CAPSULE | Freq: Two times a day (BID) | ORAL | 1 refills | Status: DC | PRN
Start: 2022-08-06 — End: 2023-08-25
  Filled 2022-08-06: qty 60, 30d supply, fill #0
  Filled 2022-11-19: qty 60, 30d supply, fill #1

## 2022-08-06 NOTE — Progress Notes (Signed)
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  She has been having acute pain with her left thumb and she says it may be related to how she used a mirror in the patient's mouth during her dental procedures.  She points to the MCP joint and the lateral/radial border of the thumb as a source of her pain.  She denies any pain at the [facility] joint or basilar thumb joint and denies any triggering.  She denies any numbness and tingling.  She has not had any specific injury but is incredibly tender to the touch.  On examination of her thumb, there is no triggering or pain over the A1 pulley.  The pain is at the MCP joint just lateral.  I can palpate the area that is very tender to her.  There is no redness.  There is no significant it effusion that I can tell from the joint at the MCP joint of her thumb.  X-rays of the left thumb shows some mild arthritic changes but no gross deformities that I can see.  I am going to try Celebrex as an anti-inflammatory and have also recommended topical Voltaren gel.  We will have her try to modify how she is holding her instruments with her left hand while she is performing gentle surgery.  We will also try a small thumb splint to wear when she is resting her thumb.  We talked about the possibility of hand therapy and even a steroid injection around this area if he gets to where is not helping in terms of time and conservative treatment.  All question concerns were answered addressed.  She agrees with this treatment plan.

## 2022-08-07 ENCOUNTER — Other Ambulatory Visit (HOSPITAL_COMMUNITY): Payer: Self-pay

## 2022-08-10 ENCOUNTER — Encounter: Payer: Self-pay | Admitting: Obstetrics & Gynecology

## 2022-08-10 ENCOUNTER — Ambulatory Visit (INDEPENDENT_AMBULATORY_CARE_PROVIDER_SITE_OTHER): Payer: BC Managed Care – PPO | Admitting: Obstetrics & Gynecology

## 2022-08-10 VITALS — BP 117/79 | HR 80 | Resp 16 | Ht 62.0 in | Wt 162.0 lb

## 2022-08-10 DIAGNOSIS — Z30431 Encounter for routine checking of intrauterine contraceptive device: Secondary | ICD-10-CM

## 2022-08-10 DIAGNOSIS — Z01419 Encounter for gynecological examination (general) (routine) without abnormal findings: Secondary | ICD-10-CM | POA: Diagnosis not present

## 2022-08-10 NOTE — Progress Notes (Signed)
Last Mammogram: n/a Last Pap Smear:  05/05/21- negative Last Colon Screening;  n/a Seat Belts:   yes Sun Screen:   yes Dental Check Up:  yes Brush & Floss:  yes

## 2022-08-10 NOTE — Progress Notes (Signed)
Subjective:     Julia Jimenez is a 36 y.o. female here for a routine exam.  Current complaints: none--likes IUD.    Gynecologic History No LMP recorded. (Menstrual status: IUD). Contraception: IUD Last Pap: 2022. Results were: normal Last mammogram: 2023. Results were: normal Last Colon Screening;  n/a Seat Belts:   yes Sun Screen:   yes Dental Check Up:  yes Brush & Floss:  yes   Obstetric History OB History  Gravida Para Term Preterm AB Living  3 1 1  0 2 1  SAB IAB Ectopic Multiple Live Births  2 0 0 0 1    # Outcome Date GA Lbr Len/2nd Weight Sex Delivery Anes PTL Lv  3 Term 12/09/21 [redacted]w[redacted]d  9 lb 12.6 oz (4.44 kg) M CS-LTranv Spinal  LIV  2 SAB 11/2020          1 SAB              The following portions of the patient's history were reviewed and updated as appropriate: allergies, current medications, past family history, past medical history, past social history, past surgical history, and problem list.  Review of Systems Pertinent items noted in HPI and remainder of comprehensive ROS otherwise negative.    Objective:     Vitals:   08/10/22 1537  BP: 117/79  Pulse: 80  Resp: 16  Weight: 162 lb (73.5 kg)  Height: 5\' 2"  (1.575 m)   Vitals:  WNL General appearance: alert, cooperative and no distress  HEENT: Normocephalic, without obvious abnormality, atraumatic Eyes: negative Throat: lips, mucosa, and tongue normal; teeth and gums normal  Respiratory: Clear to auscultation bilaterally  CV: Regular rate and rhythm  Breasts:  Normal appearance, no masses or tenderness, no nipple retraction or dimpling  GI: Soft, non-tender; bowel sounds normal; no masses,  no organomegaly  GU: External Genitalia:  Tanner V, no lesion Urethra:  No prolapse   Vagina: Pink, normal rugae, no blood or discharge  Cervix: No CMT, no lesion, strings seen  Uterus:  Normal size and contour, non tender  Adnexa: Normal, no masses, non tender  Musculoskeletal: No edema, redness or  tenderness in the calves or thighs  Skin: No lesions or rash  Lymphatic: Axillary adenopathy: none     Psychiatric: Normal mood and behavior        Assessment:    Healthy female exam.    Plan:   Pap up to date Continue IUD Has primary care for other health maintenance RTC in 1 year.

## 2022-08-28 DIAGNOSIS — H52213 Irregular astigmatism, bilateral: Secondary | ICD-10-CM | POA: Diagnosis not present

## 2022-08-28 DIAGNOSIS — H40013 Open angle with borderline findings, low risk, bilateral: Secondary | ICD-10-CM | POA: Diagnosis not present

## 2022-08-28 DIAGNOSIS — H04123 Dry eye syndrome of bilateral lacrimal glands: Secondary | ICD-10-CM | POA: Diagnosis not present

## 2022-08-28 DIAGNOSIS — H5213 Myopia, bilateral: Secondary | ICD-10-CM | POA: Diagnosis not present

## 2022-09-17 ENCOUNTER — Other Ambulatory Visit (HOSPITAL_COMMUNITY): Payer: Self-pay

## 2022-10-05 ENCOUNTER — Other Ambulatory Visit (HOSPITAL_COMMUNITY): Payer: Self-pay

## 2022-10-05 ENCOUNTER — Encounter: Payer: Self-pay | Admitting: Internal Medicine

## 2022-10-05 ENCOUNTER — Ambulatory Visit (INDEPENDENT_AMBULATORY_CARE_PROVIDER_SITE_OTHER): Payer: BC Managed Care – PPO | Admitting: Internal Medicine

## 2022-10-05 VITALS — BP 106/72 | HR 78 | Temp 98.4°F | Ht 62.0 in | Wt 161.0 lb

## 2022-10-05 DIAGNOSIS — J209 Acute bronchitis, unspecified: Secondary | ICD-10-CM | POA: Diagnosis not present

## 2022-10-05 MED ORDER — HYDROCOD POLI-CHLORPHE POLI ER 10-8 MG/5ML PO SUER
5.0000 mL | Freq: Two times a day (BID) | ORAL | 0 refills | Status: DC | PRN
  Filled 2022-10-05: qty 115, 12d supply, fill #0

## 2022-10-05 MED ORDER — FLUCONAZOLE 150 MG PO TABS
150.0000 mg | ORAL_TABLET | Freq: Once | ORAL | 0 refills | Status: AC
  Filled 2022-10-05: qty 1, 1d supply, fill #0

## 2022-10-05 MED ORDER — BENZONATATE 200 MG PO CAPS
200.0000 mg | ORAL_CAPSULE | Freq: Three times a day (TID) | ORAL | 0 refills | Status: DC | PRN
  Filled 2022-10-05: qty 30, 10d supply, fill #0

## 2022-10-05 MED ORDER — AMOXICILLIN-POT CLAVULANATE 875-125 MG PO TABS
1.0000 | ORAL_TABLET | Freq: Two times a day (BID) | ORAL | 0 refills | Status: DC
  Filled 2022-10-05: qty 20, 10d supply, fill #0

## 2022-10-05 MED ORDER — ALBUTEROL SULFATE HFA 108 (90 BASE) MCG/ACT IN AERS
2.0000 | INHALATION_SPRAY | Freq: Four times a day (QID) | RESPIRATORY_TRACT | 0 refills | Status: DC | PRN
  Filled 2022-10-05: qty 6.7, 25d supply, fill #0

## 2022-10-05 NOTE — Assessment & Plan Note (Addendum)
Acute but ongoing x 4 weeks Likely bacterial  Start Augmentin 875-125 mg BID x 10 day - diflucan if case she has a yeast infection, albuterol, tessalon perles and tussionex otc cold medications Rest, fluid Call if no improvement - may need short course of steroids

## 2022-10-05 NOTE — Patient Instructions (Addendum)
       Medications changes include :   antibiotic - Augmentin, cough pills, cough syrup and inhaler.       Return if symptoms worsen or fail to improve.

## 2022-10-05 NOTE — Progress Notes (Signed)
Subjective:    Patient ID: Julia Jimenez, female    DOB: 1986/09/22, 36 y.o.   MRN: [id number]      HPI Julia Jimenez is here for  Chief Complaint  Patient presents with   Cough    Dry cough x 4 weeks; Sometimes productive; Worse at night to the point of headaches that feel like migraine    She is here for an acute visit for cold symptoms.  Her son is sick and has had multiple illness in the past month.    Her symptoms started 4 weeks ago  She is experiencing fatigue, congestion that is mild, PND, chest tightness, cough that is mostly dry but productive at times, SOB and maybe wheeze.  She has headaches from all the coughing.  At one point she had sinus pain and sore throat that has resolved.   She has tried taking multiple otc medications.       Medications and allergies reviewed with patient and updated if appropriate.  Current Outpatient Medications on File Prior to Visit  Medication Sig Dispense Refill   celecoxib (CELEBREX) 200 MG capsule Take 1 capsule (200 mg total) by mouth 2 (two) times daily between meals as needed. 60 capsule 1   cyclobenzaprine (FLEXERIL) 5 MG tablet Take 5 mg by mouth at bedtime as needed.     MIRENA, 52 MG, 20 MCG/DAY IUD      Prenatal Vit-Fe Fumarate-FA (PRENATAL MULTIVITAMIN) TABS tablet Take 1 tablet by mouth daily at 12 noon.     Semaglutide,0.25 or 0.5MG /DOS, (OZEMPIC, 0.25 OR 0.5 MG/DOSE,) 2 MG/3ML SOPN Inject 0.25 mg into the skin once a week for 30 days, THEN 0.5 mg once a week. 3 mL 3   sertraline (ZOLOFT) 100 MG tablet Take 1 tablet (100 mg total) by mouth daily. 90 tablet 3   VITAMIN D, CHOLECALCIFEROL, PO Take by mouth.     Zinc Sulfate (ZINC 15 PO) Take by mouth.     No current facility-administered medications on file prior to visit.    Review of Systems  Constitutional:  Positive for fatigue. Negative for fever.  HENT:  Positive for congestion (minimal) and postnasal drip. Negative for ear pain, sinus pain (resolved) and  sore throat (resolved).   Respiratory:  Positive for cough (mostly dry, occ bringing phlegm up), chest tightness, shortness of breath and wheezing.   Gastrointestinal:  Negative for diarrhea and nausea.  Musculoskeletal:  Negative for myalgias.  Neurological:  Positive for headaches (from coughing).       Objective:   Vitals:   10/05/22 1441  BP: 106/72  Pulse: 78  Temp: 98.4 F (36.9 C)  SpO2: 98%   BP Readings from Last 3 Encounters:  10/05/22 106/72  08/10/22 117/79  07/03/22 106/76   Wt Readings from Last 3 Encounters:  10/05/22 161 lb (73 kg)  08/10/22 162 lb (73.5 kg)  07/03/22 162 lb (73.5 kg)   Body mass index is 29.45 kg/m.    Physical Exam Constitutional:      General: She is not in acute distress.    Appearance: Normal appearance. She is not ill-appearing.  HENT:     Head: Normocephalic and atraumatic.     Right Ear: Tympanic membrane, ear canal and external ear normal.     Left Ear: Tympanic membrane, ear canal and external ear normal.     Mouth/Throat:     Mouth: Mucous membranes are moist.     Pharynx: No oropharyngeal exudate or posterior oropharyngeal  erythema.  Eyes:     Conjunctiva/sclera: Conjunctivae normal.  Cardiovascular:     Rate and Rhythm: Normal rate and regular rhythm.  Pulmonary:     Effort: Pulmonary effort is normal. No respiratory distress.     Breath sounds: Normal breath sounds. No wheezing or rales.  Musculoskeletal:     Cervical back: Neck supple. No tenderness.  Lymphadenopathy:     Cervical: Cervical adenopathy present.  Skin:    General: Skin is warm and dry.  Neurological:     Mental Status: She is alert.            Assessment & Plan:    See Problem List for Assessment and Plan of chronic medical problems.

## 2022-10-06 ENCOUNTER — Encounter: Payer: Self-pay | Admitting: Internal Medicine

## 2022-10-13 ENCOUNTER — Other Ambulatory Visit (HOSPITAL_COMMUNITY): Payer: Self-pay

## 2022-10-14 ENCOUNTER — Encounter: Payer: Self-pay | Admitting: Internal Medicine

## 2022-10-16 ENCOUNTER — Encounter: Payer: Self-pay | Admitting: Internal Medicine

## 2022-10-19 ENCOUNTER — Other Ambulatory Visit (HOSPITAL_COMMUNITY): Payer: Self-pay

## 2022-10-19 MED ORDER — PREDNISONE 20 MG PO TABS
40.0000 mg | ORAL_TABLET | Freq: Every day | ORAL | 0 refills | Status: AC
  Filled 2022-10-19 (×2): qty 10, 5d supply, fill #0

## 2022-10-19 MED ORDER — ZEPBOUND 2.5 MG/0.5ML ~~LOC~~ SOAJ
2.5000 mg | SUBCUTANEOUS | 0 refills | Status: DC
  Filled 2022-10-19 – 2022-11-19 (×4): qty 2, 28d supply, fill #0
  Filled 2022-11-24: qty 2, 30d supply, fill #0
  Filled 2022-11-24: qty 2, 28d supply, fill #0

## 2022-11-19 ENCOUNTER — Other Ambulatory Visit (HOSPITAL_COMMUNITY): Payer: Self-pay

## 2022-11-19 ENCOUNTER — Other Ambulatory Visit: Payer: Self-pay

## 2022-11-23 ENCOUNTER — Other Ambulatory Visit: Payer: Self-pay

## 2022-11-24 ENCOUNTER — Other Ambulatory Visit (HOSPITAL_COMMUNITY): Payer: Self-pay

## 2022-11-24 ENCOUNTER — Other Ambulatory Visit: Payer: Self-pay

## 2022-12-06 ENCOUNTER — Other Ambulatory Visit: Payer: Self-pay | Admitting: Obstetrics & Gynecology

## 2022-12-06 MED ORDER — METRONIDAZOLE 500 MG PO TABS: 500.0000 mg | ORAL_TABLET | Freq: Two times a day (BID) | ORAL | 0 refills | Status: DC

## 2023-01-06 ENCOUNTER — Other Ambulatory Visit (HOSPITAL_COMMUNITY)
Admission: RE | Admit: 2023-01-06 | Discharge: 2023-01-06 | Disposition: A | Discharge status: Home / Self Care | Payer: BC Managed Care – PPO | Source: Ambulatory Visit | Attending: Obstetrics and Gynecology | Admitting: Obstetrics and Gynecology

## 2023-01-06 ENCOUNTER — Ambulatory Visit (INDEPENDENT_AMBULATORY_CARE_PROVIDER_SITE_OTHER): Payer: BC Managed Care – PPO | Admitting: *Deleted

## 2023-01-06 DIAGNOSIS — N898 Other specified noninflammatory disorders of vagina: Secondary | ICD-10-CM

## 2023-01-06 NOTE — Progress Notes (Signed)
Pt here for self swab only for BV/Yeast only per Dr Gala Romney

## 2023-01-07 ENCOUNTER — Other Ambulatory Visit: Payer: Self-pay | Admitting: Obstetrics and Gynecology

## 2023-01-07 DIAGNOSIS — N760 Acute vaginitis: Secondary | ICD-10-CM

## 2023-01-07 LAB — CERVICOVAGINAL ANCILLARY ONLY
Bacterial Vaginitis (gardnerella): POSITIVE — AB
Candida Glabrata: NEGATIVE
Candida Vaginitis: NEGATIVE
Comment: NEGATIVE
Comment: NEGATIVE
Comment: NEGATIVE

## 2023-01-07 MED ORDER — METRONIDAZOLE 500 MG PO TABS: 500.0000 mg | ORAL_TABLET | Freq: Two times a day (BID) | ORAL | 0 refills | Status: DC

## 2023-01-25 ENCOUNTER — Other Ambulatory Visit (HOSPITAL_COMMUNITY)
Admission: RE | Admit: 2023-01-25 | Discharge: 2023-01-25 | Disposition: A | Discharge status: Home / Self Care | Payer: BC Managed Care – PPO | Source: Ambulatory Visit | Attending: Obstetrics & Gynecology | Admitting: Obstetrics & Gynecology

## 2023-01-25 ENCOUNTER — Other Ambulatory Visit: Payer: Self-pay

## 2023-01-25 ENCOUNTER — Other Ambulatory Visit (HOSPITAL_COMMUNITY): Payer: Self-pay

## 2023-01-25 DIAGNOSIS — N898 Other specified noninflammatory disorders of vagina: Secondary | ICD-10-CM

## 2023-01-25 DIAGNOSIS — B9689 Other specified bacterial agents as the cause of diseases classified elsewhere: Secondary | ICD-10-CM

## 2023-01-25 MED ORDER — CLINDAMYCIN PHOSPHATE 2 % VA CREA
1.0000 | TOPICAL_CREAM | Freq: Every day | VAGINAL | 0 refills | Status: DC
  Filled 2023-01-25: qty 40, 7d supply, fill #0

## 2023-01-26 LAB — CERVICOVAGINAL ANCILLARY ONLY
Bacterial Vaginitis (gardnerella): POSITIVE — AB
Candida Glabrata: NEGATIVE
Candida Vaginitis: NEGATIVE
Comment: NEGATIVE
Comment: NEGATIVE
Comment: NEGATIVE

## 2023-02-04 ENCOUNTER — Other Ambulatory Visit (HOSPITAL_COMMUNITY)
Admission: RE | Admit: 2023-02-04 | Discharge: 2023-02-04 | Disposition: A | Discharge status: Home / Self Care | Payer: BC Managed Care – PPO | Source: Ambulatory Visit | Attending: Obstetrics & Gynecology | Admitting: Obstetrics & Gynecology

## 2023-02-04 ENCOUNTER — Other Ambulatory Visit: Payer: Self-pay | Admitting: Obstetrics & Gynecology

## 2023-02-04 ENCOUNTER — Ambulatory Visit: Payer: BC Managed Care – PPO

## 2023-02-04 DIAGNOSIS — N898 Other specified noninflammatory disorders of vagina: Secondary | ICD-10-CM | POA: Diagnosis not present

## 2023-02-05 LAB — CERVICOVAGINAL ANCILLARY ONLY
Bacterial Vaginitis (gardnerella): NEGATIVE
Candida Glabrata: NEGATIVE
Candida Vaginitis: NEGATIVE
Comment: NEGATIVE
Comment: NEGATIVE
Comment: NEGATIVE

## 2023-02-08 ENCOUNTER — Ambulatory Visit (INDEPENDENT_AMBULATORY_CARE_PROVIDER_SITE_OTHER): Payer: BC Managed Care – PPO | Admitting: Obstetrics & Gynecology

## 2023-02-08 ENCOUNTER — Other Ambulatory Visit (HOSPITAL_COMMUNITY)
Admission: RE | Admit: 2023-02-08 | Discharge: 2023-02-08 | Disposition: A | Discharge status: Home / Self Care | Payer: BC Managed Care – PPO | Source: Ambulatory Visit | Attending: Obstetrics & Gynecology | Admitting: Obstetrics & Gynecology

## 2023-02-08 ENCOUNTER — Encounter: Payer: Self-pay | Admitting: Obstetrics & Gynecology

## 2023-02-08 VITALS — BP 108/73 | HR 94 | Resp 16 | Ht 62.0 in | Wt 156.0 lb

## 2023-02-08 DIAGNOSIS — N898 Other specified noninflammatory disorders of vagina: Secondary | ICD-10-CM | POA: Diagnosis not present

## 2023-02-08 NOTE — Progress Notes (Signed)
   Subjective:    Patient ID: Julia Jimenez, female    DOB: Jan 26, 1986, 37 y.o.   MRN: [id number]  HPI  [name] presents for f/u discharge.  Recurrent BV with TOC neg last Sunday.  Symptoms returned   Review of Systems  Constitutional: Negative.   Respiratory: Negative.    Cardiovascular: Negative.   Gastrointestinal: Negative.   Genitourinary:  Positive for vaginal discharge.       Objective:   Physical Exam Vitals reviewed.  Constitutional:      General: She is not in acute distress.    Appearance: She is well-developed.  HENT:     Head: Normocephalic and atraumatic.  Eyes:     Conjunctiva/sclera: Conjunctivae normal.  Cardiovascular:     Rate and Rhythm: Normal rate.  Pulmonary:     Effort: Pulmonary effort is normal.  Genitourinary:    Comments: Tanner V Vulva:  No lesion Vagina:  Pink, no lesions, clear discharge, no blood Cervix:  No CMT, IUD strings seen Skin:    General: Skin is warm and dry.  Neurological:     Mental Status: She is alert and oriented to person, place, and time.  Psychiatric:        Mood and Affect: Mood normal.    Vitals:   02/08/23 0803  BP: 108/73  Pulse: 94  Resp: 16  Weight: 70.8 kg  Height: 5\' 2"  (1.575 m)      Assessment & Plan:  37 yo female with recurrent BV.  Will treat based on results

## 2023-02-09 LAB — CERVICOVAGINAL ANCILLARY ONLY
Bacterial Vaginitis (gardnerella): NEGATIVE
Candida Glabrata: NEGATIVE
Candida Vaginitis: NEGATIVE
Comment: NEGATIVE
Comment: NEGATIVE
Comment: NEGATIVE

## 2023-02-19 ENCOUNTER — Encounter: Payer: Self-pay | Admitting: Internal Medicine

## 2023-02-19 MED ORDER — SERTRALINE HCL 100 MG PO TABS
100.0000 mg | ORAL_TABLET | Freq: Every day | ORAL | 0 refills | Status: DC
Start: 2023-02-19 — End: 2023-05-09

## 2023-03-05 DIAGNOSIS — R1311 Dysphagia, oral phase: Secondary | ICD-10-CM | POA: Diagnosis not present

## 2023-03-05 DIAGNOSIS — R4789 Other speech disturbances: Secondary | ICD-10-CM | POA: Diagnosis not present

## 2023-04-06 ENCOUNTER — Encounter: Payer: Self-pay | Admitting: Internal Medicine

## 2023-04-06 ENCOUNTER — Ambulatory Visit: Payer: BC Managed Care – PPO | Admitting: Internal Medicine

## 2023-04-06 VITALS — BP 106/74 | HR 80 | Temp 98.0°F | Ht 62.0 in | Wt 158.0 lb

## 2023-04-06 DIAGNOSIS — J029 Acute pharyngitis, unspecified: Secondary | ICD-10-CM | POA: Diagnosis not present

## 2023-04-06 DIAGNOSIS — J019 Acute sinusitis, unspecified: Secondary | ICD-10-CM

## 2023-04-06 DIAGNOSIS — J329 Chronic sinusitis, unspecified: Secondary | ICD-10-CM | POA: Insufficient documentation

## 2023-04-06 LAB — POCT RAPID STREP A (OFFICE): Rapid Strep A Screen: NEGATIVE

## 2023-04-06 MED ORDER — AMOXICILLIN-POT CLAVULANATE 875-125 MG PO TABS: 1.0000 | ORAL_TABLET | Freq: Two times a day (BID) | ORAL | 0 refills | Status: AC

## 2023-04-06 NOTE — Assessment & Plan Note (Signed)
Acute Has had some cold symptoms for 2 weeks-sore throat is severe and new in the last 2-3 days Had difficulty swallowing a pill last night Rapid strep negative Possibly other bacterial or viral infection

## 2023-04-06 NOTE — Patient Instructions (Addendum)
      Medications changes include :   augmentin bid x 7 days       Return if symptoms worsen or fail to improve.

## 2023-04-06 NOTE — Progress Notes (Signed)
Subjective:    Patient ID: Julia Jimenez, female    DOB: 27-Jan-1986, 37 y.o.   MRN: [id number]      HPI Dayan is here for  Chief Complaint  Patient presents with   Sore Throat    2 weeks ago sinus infection and throat hasn't gotten any better. Patient has been taking Augmentin x 4 days and she can't hardly swallow (hurts down the middle) with drainage; Stepped on oyster shell (right foot) x 3 days (Went to the beach)    Her symptoms started about two weeks ago.  The last 2-3 days she has had sore throat that has been very painful -especially with swallowing.  She is concerned about the possibility of strep because this was a new symptom.  Her son does go to daycare.  Her persistence for the past 2 weeks have been nasal congestion, postnasal drip, cough and she has had some dizziness.     Medications and allergies reviewed with patient and updated if appropriate.  Current Outpatient Medications on File Prior to Visit  Medication Sig Dispense Refill   albuterol (VENTOLIN HFA) 108 (90 Base) MCG/ACT inhaler Inhale 2 puffs into the lungs every 6 (six) hours as needed for wheezing or shortness of breath. 6.7 g 0   celecoxib (CELEBREX) 200 MG capsule Take 1 capsule (200 mg total) by mouth 2 (two) times daily between meals as needed. 60 capsule 1   clindamycin (CLEOCIN) 2 % vaginal cream Place 1 Applicatorful vaginally at bedtime. 40 g 0   cyclobenzaprine (FLEXERIL) 5 MG tablet Take 5 mg by mouth at bedtime as needed.     MIRENA, 52 MG, 20 MCG/DAY IUD      Prenatal Vit-Fe Fumarate-FA (PRENATAL MULTIVITAMIN) TABS tablet Take 1 tablet by mouth daily at 12 noon.     sertraline (ZOLOFT) 100 MG tablet Take 1 tablet (100 mg total) by mouth daily. 3 tablet 0   tirzepatide (ZEPBOUND) 2.5 MG/0.5ML Pen Inject 2.5 mg into the skin once a week. 2 mL 0   VITAMIN D, CHOLECALCIFEROL, PO Take by mouth.     Zinc Sulfate (ZINC 15 PO) Take by mouth.     No current facility-administered  medications on file prior to visit.    Review of Systems  Constitutional:  Negative for chills and fever.  HENT:  Positive for congestion, postnasal drip and sore throat. Negative for sinus pressure.   Respiratory:  Positive for cough. Negative for shortness of breath and wheezing.   Neurological:  Positive for dizziness. Negative for light-headedness and headaches.       Objective:   Vitals:   04/06/23 1016  BP: 106/74  Pulse: 80  Temp: 98 F (36.7 C)  SpO2: 98%   BP Readings from Last 3 Encounters:  04/06/23 106/74  02/08/23 108/73  10/05/22 106/72   Wt Readings from Last 3 Encounters:  04/06/23 158 lb (71.7 kg)  02/08/23 156 lb (70.8 kg)  10/05/22 161 lb (73 kg)   Body mass index is 28.9 kg/m.    Physical Exam Constitutional:      General: She is not in acute distress.    Appearance: Normal appearance. She is not ill-appearing.  HENT:     Head: Normocephalic and atraumatic.     Right Ear: Tympanic membrane, ear canal and external ear normal.     Left Ear: Tympanic membrane, ear canal and external ear normal.     Mouth/Throat:     Mouth: Mucous membranes are moist.  Pharynx: No oropharyngeal exudate or posterior oropharyngeal erythema.     Tonsils: No tonsillar exudate.  Eyes:     Conjunctiva/sclera: Conjunctivae normal.  Cardiovascular:     Rate and Rhythm: Normal rate and regular rhythm.  Pulmonary:     Effort: Pulmonary effort is normal. No respiratory distress.     Breath sounds: Normal breath sounds. No wheezing or rales.  Musculoskeletal:     Cervical back: Neck supple. No tenderness.  Lymphadenopathy:     Cervical: Cervical adenopathy (Mildly tender) present.  Skin:    General: Skin is warm and dry.  Neurological:     Mental Status: She is alert.         Rapid strep test negative   Assessment & Plan:    See Problem List for Assessment and Plan of chronic medical problems.

## 2023-04-06 NOTE — Assessment & Plan Note (Signed)
Acute Concern for bacterial cause Sore throat is relatively new symptoms in the past 2-3 days and has been more severe in nature Rapid strep negative Start Augmentin 875-125 mg BID x 7 day otc cold medications Rest, fluid Call if no improvement

## 2023-05-09 ENCOUNTER — Other Ambulatory Visit: Payer: Self-pay | Admitting: Internal Medicine

## 2023-05-10 ENCOUNTER — Other Ambulatory Visit (HOSPITAL_COMMUNITY): Payer: Self-pay

## 2023-05-10 ENCOUNTER — Other Ambulatory Visit: Payer: Self-pay

## 2023-05-10 MED ORDER — SERTRALINE HCL 100 MG PO TABS
100.0000 mg | ORAL_TABLET | Freq: Every day | ORAL | 1 refills | Status: DC
  Filled 2023-05-10: qty 90, 90d supply, fill #0
  Filled 2023-08-15: qty 90, 90d supply, fill #1

## 2023-08-23 ENCOUNTER — Ambulatory Visit: Payer: BC Managed Care – PPO | Admitting: Obstetrics & Gynecology

## 2023-08-24 ENCOUNTER — Other Ambulatory Visit (HOSPITAL_COMMUNITY): Payer: Self-pay

## 2023-08-24 MED ORDER — METFORMIN HCL 500 MG PO TABS
500.0000 mg | ORAL_TABLET | Freq: Every day | ORAL | 1 refills | Status: DC
  Filled 2023-08-24: qty 90, 90d supply, fill #0

## 2023-08-25 ENCOUNTER — Encounter: Payer: Self-pay | Admitting: Obstetrics & Gynecology

## 2023-08-25 ENCOUNTER — Ambulatory Visit: Payer: BC Managed Care – PPO | Admitting: Obstetrics & Gynecology

## 2023-08-25 VITALS — Ht 62.0 in | Wt 156.0 lb

## 2023-08-25 DIAGNOSIS — Z01419 Encounter for gynecological examination (general) (routine) without abnormal findings: Secondary | ICD-10-CM | POA: Diagnosis not present

## 2023-08-25 NOTE — Progress Notes (Signed)
Subjective:     Julia Jimenez is a 37 y.o. female here for a routine exam.  Current complaints: none.     Gynecologic History No LMP recorded. (Menstrual status: IUD). Contraception: IUD Last Pap: 2022. Results were: normal Last mammogram: 2023. Results were: normal  Obstetric History OB History  Gravida Para Term Preterm AB Living  3 1 1  0 2 1  SAB IAB Ectopic Multiple Live Births  2 0 0 0 1    # Outcome Date GA Lbr Len/2nd Weight Sex Type Anes PTL Lv  3 Term 12/09/21 [redacted]w[redacted]d  9 lb 12.6 oz (4.44 kg) M CS-LTranv Spinal  LIV  2 SAB 11/2020          1 SAB              The following portions of the patient's history were reviewed and updated as appropriate: allergies, current medications, past family history, past medical history, past social history, past surgical history, and problem list.  Review of Systems Pertinent items noted in HPI and remainder of comprehensive ROS otherwise negative.    Objective:     Vitals:   08/25/23 1550  Weight: 156 lb (70.8 kg)  Height: 5\' 2"  (1.575 m)   Vitals:  WNL General appearance: alert, cooperative and no distress  HEENT: Normocephalic, without obvious abnormality, atraumatic Eyes: negative Throat: lips, mucosa, and tongue normal; teeth and gums normal  Respiratory: Clear to auscultation bilaterally  CV: Regular rate and rhythm  Breasts:  Normal appearance, no masses or tenderness, no nipple retraction or dimpling  GI: Soft, non-tender; bowel sounds normal; no masses,  no organomegaly  GU: External Genitalia:  Tanner V, stable skin tags on vulva Urethra:  No prolapse   Vagina: Pink, normal rugae, no blood or discharge  Cervix: No CMT, no lesion  Uterus:  Normal size and contour, non tender  Adnexa: Normal, no masses, non tender  Musculoskeletal: No edema, redness or tenderness in the calves or thighs  Skin: No lesions or rash  Lymphatic: Axillary adenopathy: none     Psychiatric: Normal mood and behavior         Assessment:    Healthy female exam.    Plan:   Pap up to date Continue IUD F/U with PCP for other health maintenance needs

## 2023-12-20 ENCOUNTER — Other Ambulatory Visit: Payer: Self-pay | Admitting: Internal Medicine

## 2023-12-20 ENCOUNTER — Encounter: Payer: Self-pay | Admitting: Internal Medicine

## 2023-12-20 ENCOUNTER — Other Ambulatory Visit (HOSPITAL_COMMUNITY): Payer: Self-pay

## 2023-12-20 MED ORDER — SERTRALINE HCL 100 MG PO TABS
100.0000 mg | ORAL_TABLET | Freq: Every day | ORAL | 1 refills | Status: DC
  Filled 2023-12-20: qty 90, 90d supply, fill #0
  Filled 2024-04-05: qty 90, 90d supply, fill #1

## 2024-02-11 ENCOUNTER — Ambulatory Visit

## 2024-02-11 ENCOUNTER — Other Ambulatory Visit (HOSPITAL_COMMUNITY)
Admission: RE | Admit: 2024-02-11 | Discharge: 2024-02-11 | Disposition: A | Discharge status: Home / Self Care | Source: Ambulatory Visit | Attending: Obstetrics & Gynecology | Admitting: Obstetrics & Gynecology

## 2024-02-11 DIAGNOSIS — N898 Other specified noninflammatory disorders of vagina: Secondary | ICD-10-CM | POA: Insufficient documentation

## 2024-02-11 NOTE — Progress Notes (Signed)
 Pt here for self swab due to BV symptoms.

## 2024-02-14 LAB — CERVICOVAGINAL ANCILLARY ONLY
Bacterial Vaginitis (gardnerella): POSITIVE — AB
Candida Glabrata: NEGATIVE
Candida Vaginitis: NEGATIVE
Comment: NEGATIVE
Comment: NEGATIVE
Comment: NEGATIVE

## 2024-02-15 ENCOUNTER — Other Ambulatory Visit: Payer: Self-pay | Admitting: Obstetrics & Gynecology

## 2024-02-15 ENCOUNTER — Other Ambulatory Visit: Payer: Self-pay | Admitting: *Deleted

## 2024-02-15 ENCOUNTER — Encounter: Payer: Self-pay | Admitting: Obstetrics & Gynecology

## 2024-02-15 MED ORDER — METRONIDAZOLE 500 MG PO TABS: 500.0000 mg | ORAL_TABLET | Freq: Two times a day (BID) | ORAL | 0 refills | Status: DC

## 2024-02-15 NOTE — Progress Notes (Signed)
Flagyl for BV

## 2024-03-01 ENCOUNTER — Other Ambulatory Visit: Payer: Self-pay | Admitting: Obstetrics & Gynecology

## 2024-03-01 MED ORDER — CLINDAMYCIN PHOSPHATE 2 % VA CREA: 1.0000 | TOPICAL_CREAM | Freq: Every day | VAGINAL | 0 refills | Status: DC

## 2024-03-01 NOTE — Progress Notes (Signed)
 Flagyl  did not relieve symptoms.  Cleocin  prescribed.

## 2024-03-17 ENCOUNTER — Other Ambulatory Visit (HOSPITAL_COMMUNITY): Payer: Self-pay

## 2024-03-17 MED ORDER — AMOXICILLIN 500 MG PO CAPS
500.0000 mg | ORAL_CAPSULE | Freq: Three times a day (TID) | ORAL | 0 refills | Status: AC
  Filled 2024-03-17: qty 21, 7d supply, fill #0

## 2024-04-05 ENCOUNTER — Other Ambulatory Visit: Payer: Self-pay

## 2024-04-05 ENCOUNTER — Encounter: Payer: Self-pay | Admitting: Pharmacist

## 2024-04-06 ENCOUNTER — Other Ambulatory Visit (HOSPITAL_COMMUNITY): Payer: Self-pay

## 2024-04-06 ENCOUNTER — Encounter: Payer: Self-pay | Admitting: Internal Medicine

## 2024-04-06 NOTE — Progress Notes (Unsigned)
    Subjective:    Patient ID: Urban Garden, female    DOB: September 15, 1986, 38 y.o.   MRN: [id number]      HPI Julia Jimenez is here for No chief complaint on file.   Night sweats -   ?  Hormonal fluctuations-could be worse with stress, meds Has mirena .  On sertraline .   Medications and allergies reviewed with patient and updated if appropriate.  Current Outpatient Medications on File Prior to Visit  Medication Sig Dispense Refill   clindamycin  (CLEOCIN ) 2 % vaginal cream Place 1 Applicatorful vaginally at bedtime. 40 g 0   cyclobenzaprine  (FLEXERIL ) 5 MG tablet Take 5 mg by mouth at bedtime as needed.     metroNIDAZOLE  (FLAGYL ) 500 MG tablet Take 1 tablet (500 mg total) by mouth 2 (two) times daily. 14 tablet 0   MIRENA , 52 MG, 20 MCG/DAY IUD      sertraline  (ZOLOFT ) 100 MG tablet Take 1 tablet (100 mg total) by mouth daily. 90 tablet 1   Zinc Sulfate (ZINC 15 PO) Take by mouth.     No current facility-administered medications on file prior to visit.    Review of Systems     Objective:  There were no vitals filed for this visit. BP Readings from Last 3 Encounters:  04/06/23 106/74  02/08/23 108/73  10/05/22 106/72   Wt Readings from Last 3 Encounters:  08/25/23 156 lb (70.8 kg)  04/06/23 158 lb (71.7 kg)  02/08/23 156 lb (70.8 kg)   There is no height or weight on file to calculate BMI.    Physical Exam         Assessment & Plan:    See Problem List for Assessment and Plan of chronic medical problems.

## 2024-04-07 ENCOUNTER — Other Ambulatory Visit (HOSPITAL_COMMUNITY): Payer: Self-pay

## 2024-04-07 ENCOUNTER — Ambulatory Visit: Payer: Self-pay | Admitting: Internal Medicine

## 2024-04-07 ENCOUNTER — Ambulatory Visit: Admitting: Internal Medicine

## 2024-04-07 VITALS — BP 108/76 | HR 73 | Temp 98.0°F | Ht 62.0 in | Wt 164.0 lb

## 2024-04-07 DIAGNOSIS — R3589 Other polyuria: Secondary | ICD-10-CM | POA: Diagnosis not present

## 2024-04-07 DIAGNOSIS — R61 Generalized hyperhidrosis: Secondary | ICD-10-CM | POA: Insufficient documentation

## 2024-04-07 DIAGNOSIS — E66811 Obesity, class 1: Secondary | ICD-10-CM

## 2024-04-07 DIAGNOSIS — F419 Anxiety disorder, unspecified: Secondary | ICD-10-CM | POA: Diagnosis not present

## 2024-04-07 DIAGNOSIS — R7303 Prediabetes: Secondary | ICD-10-CM | POA: Insufficient documentation

## 2024-04-07 LAB — CBC WITH DIFFERENTIAL/PLATELET
Basophils Absolute: 0.1 10*3/uL (ref 0.0–0.1)
Basophils Relative: 0.9 % (ref 0.0–3.0)
Eosinophils Absolute: 0.3 10*3/uL (ref 0.0–0.7)
Eosinophils Relative: 4.2 % (ref 0.0–5.0)
HCT: 41.8 % (ref 36.0–46.0)
Hemoglobin: 14 g/dL (ref 12.0–15.0)
Lymphocytes Relative: 30.6 % (ref 12.0–46.0)
Lymphs Abs: 2.2 10*3/uL (ref 0.7–4.0)
MCHC: 33.4 g/dL (ref 30.0–36.0)
MCV: 82.9 fl (ref 78.0–100.0)
Monocytes Absolute: 0.6 10*3/uL (ref 0.1–1.0)
Monocytes Relative: 8.1 % (ref 3.0–12.0)
Neutro Abs: 4.1 10*3/uL (ref 1.4–7.7)
Neutrophils Relative %: 56.2 % (ref 43.0–77.0)
Platelets: 214 10*3/uL (ref 150.0–400.0)
RBC: 5.04 Mil/uL (ref 3.87–5.11)
RDW: 13.6 % (ref 11.5–15.5)
WBC: 7.2 10*3/uL (ref 4.0–10.5)

## 2024-04-07 LAB — COMPREHENSIVE METABOLIC PANEL WITH GFR
ALT: 16 U/L (ref 0–35)
AST: 17 U/L (ref 0–37)
Albumin: 4.7 g/dL (ref 3.5–5.2)
Alkaline Phosphatase: 43 U/L (ref 39–117)
BUN: 14 mg/dL (ref 6–23)
CO2: 29 meq/L (ref 19–32)
Calcium: 9.7 mg/dL (ref 8.4–10.5)
Chloride: 100 meq/L (ref 96–112)
Creatinine, Ser: 0.73 mg/dL (ref 0.40–1.20)
GFR: 104 mL/min (ref >60.00)
Glucose, Bld: 94 mg/dL (ref 70–99)
Potassium: 4.2 meq/L (ref 3.5–5.1)
Sodium: 138 meq/L (ref 135–145)
Total Bilirubin: 0.5 mg/dL (ref 0.2–1.2)
Total Protein: 7.6 g/dL (ref 6.0–8.3)

## 2024-04-07 LAB — TSH: TSH: 1.48 u[IU]/mL (ref 0.35–5.50)

## 2024-04-07 LAB — HEMOGLOBIN A1C: Hgb A1c MFr Bld: 6.1 % (ref 4.6–6.5)

## 2024-04-07 LAB — T4, FREE: Free T4: 0.86 ng/dL (ref 0.60–1.60)

## 2024-04-07 MED ORDER — CYCLOBENZAPRINE HCL 5 MG PO TABS
5.0000 mg | ORAL_TABLET | Freq: Every evening | ORAL | 3 refills | Status: AC | PRN
  Filled 2024-04-07: qty 30, 30d supply, fill #0
  Filled 2024-10-19: qty 30, 30d supply, fill #1

## 2024-04-07 NOTE — Assessment & Plan Note (Signed)
 So acute Has not been experiencing night sweats for at least 2-1/2 years.  Before she got pregnant she would experience that here and there, she had them throughout her pregnancy and they have been consistent since then She experiences them 3-4 nights during the week No sweats during the day, but she does get feelings of feeling really warm like she is burning up Also states fatigue, difficulty losing weight Check TSH, CBC, CMP Will also discussed with gynecologist

## 2024-04-07 NOTE — Patient Instructions (Addendum)
      Blood work was ordered.       Medications changes include :   None

## 2024-04-07 NOTE — Assessment & Plan Note (Signed)
 Chronic She did try to decrease the sertraline  and get off of it at 1 point, but was not able to stop it Controlled, Stable Continue sertraline  100 mg daily

## 2024-04-07 NOTE — Assessment & Plan Note (Signed)
 Some increase in urination, but typically has always drink a lot of water  and has always urinated a lot She does have family history of diabetes Check A1c

## 2024-04-13 ENCOUNTER — Other Ambulatory Visit: Payer: Self-pay

## 2024-04-16 DIAGNOSIS — S92355A Nondisplaced fracture of fifth metatarsal bone, left foot, initial encounter for closed fracture: Secondary | ICD-10-CM | POA: Diagnosis not present

## 2024-04-21 DIAGNOSIS — S92355D Nondisplaced fracture of fifth metatarsal bone, left foot, subsequent encounter for fracture with routine healing: Secondary | ICD-10-CM | POA: Diagnosis not present

## 2024-05-08 DIAGNOSIS — S92355D Nondisplaced fracture of fifth metatarsal bone, left foot, subsequent encounter for fracture with routine healing: Secondary | ICD-10-CM | POA: Diagnosis not present

## 2024-05-29 DIAGNOSIS — S92355D Nondisplaced fracture of fifth metatarsal bone, left foot, subsequent encounter for fracture with routine healing: Secondary | ICD-10-CM | POA: Diagnosis not present

## 2024-06-09 ENCOUNTER — Ambulatory Visit: Admitting: Dietician

## 2024-06-23 ENCOUNTER — Ambulatory Visit (INDEPENDENT_AMBULATORY_CARE_PROVIDER_SITE_OTHER): Admitting: Obstetrics and Gynecology

## 2024-06-23 ENCOUNTER — Encounter: Payer: Self-pay | Admitting: Obstetrics and Gynecology

## 2024-06-23 VITALS — BP 123/80 | HR 82 | Ht 62.0 in | Wt 163.0 lb

## 2024-06-23 DIAGNOSIS — Z30432 Encounter for removal of intrauterine contraceptive device: Secondary | ICD-10-CM

## 2024-06-23 NOTE — Progress Notes (Signed)
 IUD Removal   Patient was in the dorsal lithotomy position, normal external genitalia was noted.  A speculum was placed in the patient's vagina, normal discharge was noted, no lesions. The multiparous cervix was visualized, no lesions, no abnormal discharge.  The strings of the IUD were grasped and pulled using ring forceps. The IUD was removed in its entirety. Patient tolerated the procedure well.     Patient is planning pregnancy. Start prenatal vitamins and folic acid 1 mg.   5g/5g although low variant, she was on heparin with her last pregnancy.  Call the office with a positive pregnancy test.   Dorita Delon FERNS, NP 06/23/2024 11:11 AM

## 2024-07-25 ENCOUNTER — Other Ambulatory Visit: Payer: Self-pay | Admitting: Internal Medicine

## 2024-07-25 ENCOUNTER — Other Ambulatory Visit: Payer: Self-pay

## 2024-07-25 ENCOUNTER — Other Ambulatory Visit (HOSPITAL_COMMUNITY): Payer: Self-pay

## 2024-07-25 MED ORDER — SERTRALINE HCL 100 MG PO TABS
100.0000 mg | ORAL_TABLET | Freq: Every day | ORAL | 1 refills | Status: AC
  Filled 2024-07-25: qty 90, 90d supply, fill #0
  Filled 2024-10-19: qty 90, 90d supply, fill #1

## 2024-08-25 ENCOUNTER — Encounter: Payer: BC Managed Care – PPO | Admitting: Internal Medicine

## 2024-08-31 ENCOUNTER — Encounter: Payer: Self-pay | Admitting: Internal Medicine

## 2024-08-31 NOTE — Patient Instructions (Addendum)

## 2024-08-31 NOTE — Progress Notes (Unsigned)
 Subjective:    Patient ID: Julia Jimenez, female    DOB: 1986/07/02, 38 y.o.   MRN: [id number]      HPI Julia Jimenez is here for a Physical exam and her chronic medical problems.    Doing weight watchers.  Trying to get pregnant-home test was negative, but she does feel like she may be pregnant and wondered about a blood test.   Medications and allergies reviewed with patient and updated if appropriate.  Current Outpatient Medications on File Prior to Visit  Medication Sig Dispense Refill   cyclobenzaprine  (FLEXERIL ) 5 MG tablet Take 1 tablet (5 mg total) by mouth at bedtime as needed. 30 tablet 3   MIRENA , 52 MG, 20 MCG/DAY IUD      sertraline  (ZOLOFT ) 100 MG tablet Take 1 tablet (100 mg total) by mouth daily. 90 tablet 1   No current facility-administered medications on file prior to visit.    Review of Systems  Constitutional:  Negative for fever.  Eyes:  Negative for visual disturbance.  Respiratory:  Negative for cough, shortness of breath and wheezing.   Cardiovascular:  Positive for palpitations (? anxiety related). Negative for chest pain and leg swelling.  Gastrointestinal:  Negative for abdominal pain, blood in stool, constipation and diarrhea.       No gerd  Genitourinary:  Negative for dysuria.  Musculoskeletal:  Positive for back pain (chronic - work). Negative for arthralgias.  Skin:  Negative for rash.  Neurological:  Positive for dizziness (occ - transient) and headaches (sinus). Negative for light-headedness.  Psychiatric/Behavioral:  Negative for dysphoric mood. The patient is not nervous/anxious.        Objective:   Vitals:   09/01/24 1300  BP: 110/70  Pulse: 73  Temp: 98.2 F (36.8 C)  SpO2: 97%   Filed Weights   09/01/24 1300  Weight: 165 lb (74.8 kg)   Body mass index is 30.18 kg/m.  BP Readings from Last 3 Encounters:  09/01/24 110/70  06/23/24 123/80  04/07/24 108/76    Wt Readings from Last 3 Encounters:  09/01/24 165 lb  (74.8 kg)  06/23/24 163 lb (73.9 kg)  04/07/24 164 lb (74.4 kg)       Physical Exam Constitutional: She appears well-developed and well-nourished. No distress.  HENT:  Head: Normocephalic and atraumatic.  Right Ear: External ear normal. Normal ear canal and TM Left Ear: External ear normal.  Normal ear canal and TM Mouth/Throat: Oropharynx is clear and moist.  Eyes: Conjunctivae normal.  Neck: Neck supple. No tracheal deviation present. No thyromegaly present.  No carotid bruit  Cardiovascular: Normal rate, regular rhythm and normal heart sounds.   No murmur heard.  No edema. Pulmonary/Chest: Effort normal and breath sounds normal. No respiratory distress. She has no wheezes. She has no rales.  Breast: deferred   Abdominal: Soft. She exhibits no distension. There is no tenderness.  Lymphadenopathy: She has no cervical adenopathy.  Skin: Skin is warm and dry. She is not diaphoretic.  Psychiatric: She has a normal mood and affect. Her behavior is normal.     Lab Results  Component Value Date   WBC 7.2 04/07/2024   HGB 14.0 04/07/2024   HCT 41.8 04/07/2024   PLT 214.0 04/07/2024   GLUCOSE 94 04/07/2024   CHOL 162 03/20/2020   TRIG 56.0 03/20/2020   HDL 65.90 03/20/2020   LDLCALC 85 03/20/2020   ALT 16 04/07/2024   AST 17 04/07/2024   NA 138 04/07/2024   K  4.2 04/07/2024   CL 100 04/07/2024   CREATININE 0.73 04/07/2024   BUN 14 04/07/2024   CO2 29 04/07/2024   TSH 1.48 04/07/2024   HGBA1C 6.1 04/07/2024         Assessment & Plan:   Physical exam: Screening blood work  ordered Exercise  regular Weight  obese - frustrated about weight Substance abuse  none   Reviewed recommended immunizations.   Health Maintenance  Topic Date Due   COVID-19 Vaccine (3 - 2025-26 season) 09/17/2024 (Originally 07/10/2024)   Influenza Vaccine  02/06/2025 (Originally 06/09/2024)   Cervical Cancer Screening (HPV/Pap Cotest)  05/05/2026   DTaP/Tdap/Td (4 - Td or Tdap) 09/26/2031    Hepatitis C Screening  Completed   HIV Screening  Completed   Pneumococcal Vaccine  Aged Out   Meningococcal B Vaccine  Aged Out   Hepatitis B Vaccines 19-59 Average Risk  Discontinued   HPV VACCINES  Discontinued          See Problem List for Assessment and Plan of chronic medical problems.

## 2024-09-01 ENCOUNTER — Ambulatory Visit: Payer: Self-pay | Admitting: Internal Medicine

## 2024-09-01 ENCOUNTER — Ambulatory Visit: Admitting: Internal Medicine

## 2024-09-01 VITALS — BP 110/70 | HR 73 | Temp 98.2°F | Ht 62.0 in | Wt 165.0 lb

## 2024-09-01 DIAGNOSIS — F419 Anxiety disorder, unspecified: Secondary | ICD-10-CM | POA: Diagnosis not present

## 2024-09-01 DIAGNOSIS — N912 Amenorrhea, unspecified: Secondary | ICD-10-CM | POA: Diagnosis not present

## 2024-09-01 DIAGNOSIS — Z0000 Encounter for general adult medical examination without abnormal findings: Secondary | ICD-10-CM | POA: Diagnosis not present

## 2024-09-01 DIAGNOSIS — G479 Sleep disorder, unspecified: Secondary | ICD-10-CM

## 2024-09-01 DIAGNOSIS — E66811 Obesity, class 1: Secondary | ICD-10-CM

## 2024-09-01 DIAGNOSIS — F3289 Other specified depressive episodes: Secondary | ICD-10-CM

## 2024-09-01 DIAGNOSIS — R7303 Prediabetes: Secondary | ICD-10-CM

## 2024-09-01 LAB — LIPID PANEL
Cholesterol: 155 mg/dL (ref 0–200)
HDL: 69.8 mg/dL (ref >39.00)
LDL Cholesterol: 78 mg/dL (ref 0–99)
NonHDL: 85.68
Total CHOL/HDL Ratio: 2
Triglycerides: 38 mg/dL (ref 0.0–149.0)
VLDL: 7.6 mg/dL (ref 0.0–40.0)

## 2024-09-01 LAB — COMPREHENSIVE METABOLIC PANEL WITH GFR
ALT: 20 U/L (ref 0–35)
AST: 19 U/L (ref 0–37)
Albumin: 4.6 g/dL (ref 3.5–5.2)
Alkaline Phosphatase: 36 U/L — ABNORMAL LOW (ref 39–117)
BUN: 13 mg/dL (ref 6–23)
CO2: 27 meq/L (ref 19–32)
Calcium: 9.5 mg/dL (ref 8.4–10.5)
Chloride: 103 meq/L (ref 96–112)
Creatinine, Ser: 0.65 mg/dL (ref 0.40–1.20)
GFR: 111.03 mL/min (ref >60.00)
Glucose, Bld: 94 mg/dL (ref 70–99)
Potassium: 4.1 meq/L (ref 3.5–5.1)
Sodium: 138 meq/L (ref 135–145)
Total Bilirubin: 0.6 mg/dL (ref 0.2–1.2)
Total Protein: 7.3 g/dL (ref 6.0–8.3)

## 2024-09-01 LAB — CBC
HCT: 42.8 % (ref 36.0–46.0)
Hemoglobin: 14 g/dL (ref 12.0–15.0)
MCHC: 32.7 g/dL (ref 30.0–36.0)
MCV: 85.6 fl (ref 78.0–100.0)
Platelets: 189 K/uL (ref 150.0–400.0)
RBC: 5 Mil/uL (ref 3.87–5.11)
RDW: 13.8 % (ref 11.5–15.5)
WBC: 6.7 K/uL (ref 4.0–10.5)

## 2024-09-01 LAB — HCG, SERUM, QUALITATIVE: Preg, Serum: NEGATIVE

## 2024-09-01 LAB — HEMOGLOBIN A1C: Hgb A1c MFr Bld: 5.9 % (ref 4.6–6.5)

## 2024-09-01 LAB — TSH: TSH: 1.18 u[IU]/mL (ref 0.35–5.50)

## 2024-09-01 NOTE — Assessment & Plan Note (Signed)
 Chronic Lab Results  Component Value Date   HGBA1C 6.1 04/07/2024   Check a1c Low sugar / carb diet Stressed regular exercise

## 2024-09-01 NOTE — Assessment & Plan Note (Signed)
 Chronic Controlled, Stable Continue sertraline 100 mg daily

## 2024-09-01 NOTE — Assessment & Plan Note (Signed)
Chronic Controlled, Stable Continue sertraline 150 mg daily 

## 2024-09-01 NOTE — Assessment & Plan Note (Addendum)
 Chronic Exercising regularly Eating healthy  Trying get pregnant now Doing weight watchers

## 2024-09-01 NOTE — Assessment & Plan Note (Signed)
 Chronic Not sleeping great Wakes up often-thinks it is because of worrying Does not want to take any medication

## 2024-09-08 DIAGNOSIS — D2261 Melanocytic nevi of right upper limb, including shoulder: Secondary | ICD-10-CM | POA: Diagnosis not present

## 2024-09-08 DIAGNOSIS — L821 Other seborrheic keratosis: Secondary | ICD-10-CM | POA: Diagnosis not present

## 2024-09-08 DIAGNOSIS — D2262 Melanocytic nevi of left upper limb, including shoulder: Secondary | ICD-10-CM | POA: Diagnosis not present

## 2024-09-08 DIAGNOSIS — D225 Melanocytic nevi of trunk: Secondary | ICD-10-CM | POA: Diagnosis not present

## 2024-10-03 ENCOUNTER — Other Ambulatory Visit: Payer: Self-pay

## 2024-10-03 ENCOUNTER — Other Ambulatory Visit (HOSPITAL_COMMUNITY): Payer: Self-pay

## 2024-10-03 MED ORDER — LETROZOLE 2.5 MG PO TABS
2.5000 mg | ORAL_TABLET | Freq: Every day | ORAL | 1 refills | Status: DC
  Filled 2024-10-03: qty 5, 5d supply, fill #0
  Filled 2024-11-01: qty 5, 5d supply, fill #1

## 2024-10-03 NOTE — Progress Notes (Signed)
 Verbal order received from Dr. Leggett for Femara , sent to pharmacy.  Silvano LELON Piano, RN

## 2024-11-06 ENCOUNTER — Other Ambulatory Visit: Payer: Self-pay

## 2024-11-06 DIAGNOSIS — N926 Irregular menstruation, unspecified: Secondary | ICD-10-CM

## 2024-11-06 NOTE — Progress Notes (Signed)
 Verbal orders for Beta from Dr. Cris. Patient notified via mychart.  Silvano LELON Piano, RN

## 2024-11-07 ENCOUNTER — Other Ambulatory Visit (HOSPITAL_COMMUNITY): Payer: Self-pay

## 2024-11-07 ENCOUNTER — Other Ambulatory Visit: Payer: Self-pay | Admitting: Obstetrics & Gynecology

## 2024-11-07 LAB — BETA HCG QUANT (REF LAB): hCG Quant: <1 m[IU]/mL

## 2024-11-07 MED ORDER — LETROZOLE 2.5 MG PO TABS
5.0000 mg | ORAL_TABLET | Freq: Every day | ORAL | 1 refills | Status: AC
  Filled 2024-11-07 (×2): qty 10, 5d supply, fill #0

## 2024-11-07 NOTE — Progress Notes (Signed)
 Pt started her mense today.  Femara  5 mg called in for this month.

## 2024-11-08 ENCOUNTER — Other Ambulatory Visit (HOSPITAL_COMMUNITY): Payer: Self-pay
